# Patient Record
Sex: Female | Born: 1950 | Race: White | Hispanic: No | Marital: Single | Smoking: Former smoker
Health system: Southern US, Community
[De-identification: ages and names within clinical notes are randomized; demographics above are authoritative.]

## PROBLEM LIST (undated history)

## (undated) DIAGNOSIS — I509 Heart failure, unspecified: Secondary | ICD-10-CM

## (undated) DIAGNOSIS — U071 COVID-19: Secondary | ICD-10-CM

## (undated) DIAGNOSIS — I2510 Atherosclerotic heart disease of native coronary artery without angina pectoris: Secondary | ICD-10-CM

## (undated) DIAGNOSIS — E119 Type 2 diabetes mellitus without complications: Secondary | ICD-10-CM

## (undated) DIAGNOSIS — E785 Hyperlipidemia, unspecified: Secondary | ICD-10-CM

## (undated) DIAGNOSIS — E079 Disorder of thyroid, unspecified: Secondary | ICD-10-CM

## (undated) DIAGNOSIS — I214 Non-ST elevation (NSTEMI) myocardial infarction: Secondary | ICD-10-CM

## (undated) HISTORY — DX: Hyperlipidemia, unspecified: E78.5

## (undated) HISTORY — DX: Heart failure, unspecified: I50.9

## (undated) HISTORY — DX: Type 2 diabetes mellitus without complications: E11.9

## (undated) HISTORY — PX: CARDIAC CATHETERIZATION: SHX172

## (undated) HISTORY — DX: Non-ST elevation (NSTEMI) myocardial infarction: I21.4

## (undated) HISTORY — DX: COVID-19: U07.1

## (undated) HISTORY — DX: Atherosclerotic heart disease of native coronary artery without angina pectoris: I25.10

## (undated) HISTORY — DX: Disorder of thyroid, unspecified: E07.9

## (undated) HISTORY — PX: KNEE SURGERY: SHX244

---

## 2003-12-19 ENCOUNTER — Ambulatory Visit: Payer: Self-pay | Admitting: Family Medicine

## 2004-02-03 ENCOUNTER — Ambulatory Visit: Payer: Self-pay | Admitting: Family Medicine

## 2004-03-05 ENCOUNTER — Ambulatory Visit: Payer: Self-pay | Admitting: Family Medicine

## 2004-04-07 ENCOUNTER — Ambulatory Visit: Payer: Self-pay | Admitting: Family Medicine

## 2004-05-05 ENCOUNTER — Ambulatory Visit: Payer: Self-pay | Admitting: Family Medicine

## 2004-06-03 ENCOUNTER — Ambulatory Visit: Payer: Self-pay | Admitting: Family Medicine

## 2004-09-08 ENCOUNTER — Ambulatory Visit: Payer: Self-pay | Admitting: Family Medicine

## 2005-01-14 ENCOUNTER — Ambulatory Visit: Payer: Self-pay | Admitting: Family Medicine

## 2005-02-19 ENCOUNTER — Ambulatory Visit: Payer: Self-pay | Admitting: Family Medicine

## 2005-03-04 ENCOUNTER — Encounter: Admission: RE | Admit: 2005-03-04 | Discharge: 2005-03-04 | Payer: Self-pay | Admitting: Family Medicine

## 2005-03-19 ENCOUNTER — Encounter: Admission: RE | Admit: 2005-03-19 | Discharge: 2005-03-19 | Payer: Self-pay | Admitting: Family Medicine

## 2015-01-23 DIAGNOSIS — E039 Hypothyroidism, unspecified: Secondary | ICD-10-CM | POA: Diagnosis not present

## 2015-01-23 DIAGNOSIS — E784 Other hyperlipidemia: Secondary | ICD-10-CM | POA: Diagnosis not present

## 2015-01-23 DIAGNOSIS — E119 Type 2 diabetes mellitus without complications: Secondary | ICD-10-CM | POA: Diagnosis not present

## 2015-01-27 DIAGNOSIS — M2550 Pain in unspecified joint: Secondary | ICD-10-CM | POA: Diagnosis not present

## 2015-01-27 DIAGNOSIS — E784 Other hyperlipidemia: Secondary | ICD-10-CM | POA: Diagnosis not present

## 2015-01-27 DIAGNOSIS — R32 Unspecified urinary incontinence: Secondary | ICD-10-CM | POA: Diagnosis not present

## 2015-01-27 DIAGNOSIS — E119 Type 2 diabetes mellitus without complications: Secondary | ICD-10-CM | POA: Diagnosis not present

## 2015-01-27 DIAGNOSIS — I10 Essential (primary) hypertension: Secondary | ICD-10-CM | POA: Diagnosis not present

## 2015-01-27 DIAGNOSIS — E039 Hypothyroidism, unspecified: Secondary | ICD-10-CM | POA: Diagnosis not present

## 2015-01-27 DIAGNOSIS — F329 Major depressive disorder, single episode, unspecified: Secondary | ICD-10-CM | POA: Diagnosis not present

## 2015-07-01 DIAGNOSIS — E7800 Pure hypercholesterolemia, unspecified: Secondary | ICD-10-CM | POA: Diagnosis not present

## 2015-07-01 DIAGNOSIS — R7301 Impaired fasting glucose: Secondary | ICD-10-CM | POA: Diagnosis not present

## 2015-07-14 DIAGNOSIS — E039 Hypothyroidism, unspecified: Secondary | ICD-10-CM | POA: Diagnosis not present

## 2015-07-14 DIAGNOSIS — E119 Type 2 diabetes mellitus without complications: Secondary | ICD-10-CM | POA: Insufficient documentation

## 2015-07-14 DIAGNOSIS — F339 Major depressive disorder, recurrent, unspecified: Secondary | ICD-10-CM | POA: Diagnosis not present

## 2015-07-14 DIAGNOSIS — E784 Other hyperlipidemia: Secondary | ICD-10-CM | POA: Diagnosis not present

## 2015-07-14 DIAGNOSIS — G4733 Obstructive sleep apnea (adult) (pediatric): Secondary | ICD-10-CM | POA: Diagnosis not present

## 2015-07-14 DIAGNOSIS — R32 Unspecified urinary incontinence: Secondary | ICD-10-CM | POA: Diagnosis not present

## 2015-07-14 DIAGNOSIS — Z0000 Encounter for general adult medical examination without abnormal findings: Secondary | ICD-10-CM | POA: Diagnosis not present

## 2015-07-14 DIAGNOSIS — E1169 Type 2 diabetes mellitus with other specified complication: Secondary | ICD-10-CM | POA: Insufficient documentation

## 2015-07-14 DIAGNOSIS — M65342 Trigger finger, left ring finger: Secondary | ICD-10-CM | POA: Diagnosis not present

## 2015-07-14 DIAGNOSIS — E7849 Other hyperlipidemia: Secondary | ICD-10-CM | POA: Insufficient documentation

## 2015-07-14 DIAGNOSIS — I10 Essential (primary) hypertension: Secondary | ICD-10-CM | POA: Insufficient documentation

## 2015-07-14 DIAGNOSIS — Z1231 Encounter for screening mammogram for malignant neoplasm of breast: Secondary | ICD-10-CM | POA: Diagnosis not present

## 2015-07-14 DIAGNOSIS — I152 Hypertension secondary to endocrine disorders: Secondary | ICD-10-CM | POA: Insufficient documentation

## 2015-07-14 DIAGNOSIS — M2550 Pain in unspecified joint: Secondary | ICD-10-CM | POA: Diagnosis not present

## 2015-07-14 DIAGNOSIS — E1159 Type 2 diabetes mellitus with other circulatory complications: Secondary | ICD-10-CM | POA: Insufficient documentation

## 2015-07-14 DIAGNOSIS — Z794 Long term (current) use of insulin: Secondary | ICD-10-CM | POA: Diagnosis not present

## 2015-07-28 DIAGNOSIS — R399 Unspecified symptoms and signs involving the genitourinary system: Secondary | ICD-10-CM | POA: Diagnosis not present

## 2015-07-28 DIAGNOSIS — Z01419 Encounter for gynecological examination (general) (routine) without abnormal findings: Secondary | ICD-10-CM | POA: Diagnosis not present

## 2015-07-31 DIAGNOSIS — Z1231 Encounter for screening mammogram for malignant neoplasm of breast: Secondary | ICD-10-CM | POA: Diagnosis not present

## 2015-09-24 DIAGNOSIS — M65342 Trigger finger, left ring finger: Secondary | ICD-10-CM | POA: Diagnosis not present

## 2015-09-24 DIAGNOSIS — G8929 Other chronic pain: Secondary | ICD-10-CM | POA: Diagnosis not present

## 2015-09-24 DIAGNOSIS — M720 Palmar fascial fibromatosis [Dupuytren]: Secondary | ICD-10-CM | POA: Diagnosis not present

## 2015-09-24 DIAGNOSIS — M25562 Pain in left knee: Secondary | ICD-10-CM | POA: Diagnosis not present

## 2015-09-24 DIAGNOSIS — M25561 Pain in right knee: Secondary | ICD-10-CM | POA: Diagnosis not present

## 2015-10-22 DIAGNOSIS — M79642 Pain in left hand: Secondary | ICD-10-CM | POA: Diagnosis not present

## 2015-11-24 DIAGNOSIS — M2550 Pain in unspecified joint: Secondary | ICD-10-CM | POA: Diagnosis not present

## 2015-11-24 DIAGNOSIS — R8290 Unspecified abnormal findings in urine: Secondary | ICD-10-CM | POA: Diagnosis not present

## 2015-11-24 DIAGNOSIS — Z23 Encounter for immunization: Secondary | ICD-10-CM | POA: Diagnosis not present

## 2015-11-24 DIAGNOSIS — E119 Type 2 diabetes mellitus without complications: Secondary | ICD-10-CM | POA: Diagnosis not present

## 2015-11-24 DIAGNOSIS — Z794 Long term (current) use of insulin: Secondary | ICD-10-CM | POA: Diagnosis not present

## 2015-11-24 DIAGNOSIS — I10 Essential (primary) hypertension: Secondary | ICD-10-CM | POA: Diagnosis not present

## 2015-11-24 DIAGNOSIS — M25562 Pain in left knee: Secondary | ICD-10-CM | POA: Diagnosis not present

## 2015-11-24 DIAGNOSIS — F339 Major depressive disorder, recurrent, unspecified: Secondary | ICD-10-CM | POA: Diagnosis not present

## 2015-11-24 DIAGNOSIS — M25561 Pain in right knee: Secondary | ICD-10-CM | POA: Diagnosis not present

## 2015-11-24 DIAGNOSIS — E784 Other hyperlipidemia: Secondary | ICD-10-CM | POA: Diagnosis not present

## 2015-11-24 DIAGNOSIS — H8142 Vertigo of central origin, left ear: Secondary | ICD-10-CM | POA: Diagnosis not present

## 2015-11-24 DIAGNOSIS — E1165 Type 2 diabetes mellitus with hyperglycemia: Secondary | ICD-10-CM | POA: Diagnosis not present

## 2015-11-24 DIAGNOSIS — G8929 Other chronic pain: Secondary | ICD-10-CM | POA: Diagnosis not present

## 2015-12-03 DIAGNOSIS — I10 Essential (primary) hypertension: Secondary | ICD-10-CM | POA: Diagnosis not present

## 2015-12-03 DIAGNOSIS — E039 Hypothyroidism, unspecified: Secondary | ICD-10-CM | POA: Diagnosis not present

## 2015-12-03 DIAGNOSIS — Z794 Long term (current) use of insulin: Secondary | ICD-10-CM | POA: Diagnosis not present

## 2015-12-03 DIAGNOSIS — M2550 Pain in unspecified joint: Secondary | ICD-10-CM | POA: Diagnosis not present

## 2015-12-03 DIAGNOSIS — E119 Type 2 diabetes mellitus without complications: Secondary | ICD-10-CM | POA: Diagnosis not present

## 2015-12-03 DIAGNOSIS — E784 Other hyperlipidemia: Secondary | ICD-10-CM | POA: Diagnosis not present

## 2016-04-01 DIAGNOSIS — E784 Other hyperlipidemia: Secondary | ICD-10-CM | POA: Diagnosis not present

## 2016-04-01 DIAGNOSIS — I10 Essential (primary) hypertension: Secondary | ICD-10-CM | POA: Diagnosis not present

## 2016-04-01 DIAGNOSIS — Z794 Long term (current) use of insulin: Secondary | ICD-10-CM | POA: Diagnosis not present

## 2016-04-01 DIAGNOSIS — E039 Hypothyroidism, unspecified: Secondary | ICD-10-CM | POA: Diagnosis not present

## 2016-04-01 DIAGNOSIS — E119 Type 2 diabetes mellitus without complications: Secondary | ICD-10-CM | POA: Diagnosis not present

## 2016-04-05 DIAGNOSIS — G8929 Other chronic pain: Secondary | ICD-10-CM | POA: Diagnosis not present

## 2016-04-05 DIAGNOSIS — E119 Type 2 diabetes mellitus without complications: Secondary | ICD-10-CM | POA: Diagnosis not present

## 2016-04-05 DIAGNOSIS — E039 Hypothyroidism, unspecified: Secondary | ICD-10-CM | POA: Diagnosis not present

## 2016-04-05 DIAGNOSIS — E784 Other hyperlipidemia: Secondary | ICD-10-CM | POA: Diagnosis not present

## 2016-04-05 DIAGNOSIS — F339 Major depressive disorder, recurrent, unspecified: Secondary | ICD-10-CM | POA: Diagnosis not present

## 2016-04-05 DIAGNOSIS — M25562 Pain in left knee: Secondary | ICD-10-CM | POA: Diagnosis not present

## 2016-04-05 DIAGNOSIS — R319 Hematuria, unspecified: Secondary | ICD-10-CM | POA: Diagnosis not present

## 2016-04-05 DIAGNOSIS — M2550 Pain in unspecified joint: Secondary | ICD-10-CM | POA: Diagnosis not present

## 2016-04-05 DIAGNOSIS — M25561 Pain in right knee: Secondary | ICD-10-CM | POA: Diagnosis not present

## 2016-04-05 DIAGNOSIS — R8299 Other abnormal findings in urine: Secondary | ICD-10-CM | POA: Diagnosis not present

## 2016-04-05 DIAGNOSIS — I10 Essential (primary) hypertension: Secondary | ICD-10-CM | POA: Diagnosis not present

## 2016-04-05 DIAGNOSIS — Z794 Long term (current) use of insulin: Secondary | ICD-10-CM | POA: Diagnosis not present

## 2016-07-27 DIAGNOSIS — G4733 Obstructive sleep apnea (adult) (pediatric): Secondary | ICD-10-CM | POA: Diagnosis not present

## 2016-07-27 DIAGNOSIS — E784 Other hyperlipidemia: Secondary | ICD-10-CM | POA: Diagnosis not present

## 2016-07-27 DIAGNOSIS — I10 Essential (primary) hypertension: Secondary | ICD-10-CM | POA: Diagnosis not present

## 2016-07-27 DIAGNOSIS — Z23 Encounter for immunization: Secondary | ICD-10-CM | POA: Diagnosis not present

## 2016-07-27 DIAGNOSIS — E119 Type 2 diabetes mellitus without complications: Secondary | ICD-10-CM | POA: Diagnosis not present

## 2016-07-27 DIAGNOSIS — E039 Hypothyroidism, unspecified: Secondary | ICD-10-CM | POA: Diagnosis not present

## 2016-07-27 DIAGNOSIS — Z0000 Encounter for general adult medical examination without abnormal findings: Secondary | ICD-10-CM | POA: Diagnosis not present

## 2017-01-14 DIAGNOSIS — F339 Major depressive disorder, recurrent, unspecified: Secondary | ICD-10-CM | POA: Diagnosis not present

## 2017-01-14 DIAGNOSIS — E119 Type 2 diabetes mellitus without complications: Secondary | ICD-10-CM | POA: Diagnosis not present

## 2017-01-14 DIAGNOSIS — E039 Hypothyroidism, unspecified: Secondary | ICD-10-CM | POA: Diagnosis not present

## 2017-01-14 DIAGNOSIS — I10 Essential (primary) hypertension: Secondary | ICD-10-CM | POA: Diagnosis not present

## 2017-01-14 DIAGNOSIS — Z794 Long term (current) use of insulin: Secondary | ICD-10-CM | POA: Diagnosis not present

## 2017-01-14 DIAGNOSIS — E7849 Other hyperlipidemia: Secondary | ICD-10-CM | POA: Diagnosis not present

## 2017-04-14 DIAGNOSIS — E7849 Other hyperlipidemia: Secondary | ICD-10-CM | POA: Diagnosis not present

## 2017-04-14 DIAGNOSIS — I10 Essential (primary) hypertension: Secondary | ICD-10-CM | POA: Diagnosis not present

## 2017-04-14 DIAGNOSIS — E119 Type 2 diabetes mellitus without complications: Secondary | ICD-10-CM | POA: Diagnosis not present

## 2017-05-04 DIAGNOSIS — E039 Hypothyroidism, unspecified: Secondary | ICD-10-CM | POA: Diagnosis not present

## 2017-05-04 DIAGNOSIS — M720 Palmar fascial fibromatosis [Dupuytren]: Secondary | ICD-10-CM | POA: Diagnosis not present

## 2017-05-04 DIAGNOSIS — E119 Type 2 diabetes mellitus without complications: Secondary | ICD-10-CM | POA: Diagnosis not present

## 2017-05-04 DIAGNOSIS — F339 Major depressive disorder, recurrent, unspecified: Secondary | ICD-10-CM | POA: Diagnosis not present

## 2017-05-18 DIAGNOSIS — Z794 Long term (current) use of insulin: Secondary | ICD-10-CM | POA: Insufficient documentation

## 2017-05-18 DIAGNOSIS — E119 Type 2 diabetes mellitus without complications: Secondary | ICD-10-CM | POA: Insufficient documentation

## 2017-05-19 DIAGNOSIS — L820 Inflamed seborrheic keratosis: Secondary | ICD-10-CM | POA: Diagnosis not present

## 2017-05-19 DIAGNOSIS — L209 Atopic dermatitis, unspecified: Secondary | ICD-10-CM | POA: Diagnosis not present

## 2017-05-24 DIAGNOSIS — Z794 Long term (current) use of insulin: Secondary | ICD-10-CM | POA: Diagnosis not present

## 2017-05-24 DIAGNOSIS — I872 Venous insufficiency (chronic) (peripheral): Secondary | ICD-10-CM | POA: Diagnosis not present

## 2017-05-24 DIAGNOSIS — E119 Type 2 diabetes mellitus without complications: Secondary | ICD-10-CM | POA: Diagnosis not present

## 2017-05-25 DIAGNOSIS — Z1231 Encounter for screening mammogram for malignant neoplasm of breast: Secondary | ICD-10-CM | POA: Diagnosis not present

## 2017-06-30 DIAGNOSIS — E119 Type 2 diabetes mellitus without complications: Secondary | ICD-10-CM | POA: Diagnosis not present

## 2017-06-30 DIAGNOSIS — Z794 Long term (current) use of insulin: Secondary | ICD-10-CM | POA: Diagnosis not present

## 2017-06-30 DIAGNOSIS — I872 Venous insufficiency (chronic) (peripheral): Secondary | ICD-10-CM | POA: Diagnosis not present

## 2017-10-24 DIAGNOSIS — I10 Essential (primary) hypertension: Secondary | ICD-10-CM | POA: Diagnosis not present

## 2017-10-24 DIAGNOSIS — G4733 Obstructive sleep apnea (adult) (pediatric): Secondary | ICD-10-CM | POA: Diagnosis not present

## 2017-10-24 DIAGNOSIS — Z794 Long term (current) use of insulin: Secondary | ICD-10-CM | POA: Diagnosis not present

## 2017-10-24 DIAGNOSIS — R8290 Unspecified abnormal findings in urine: Secondary | ICD-10-CM | POA: Diagnosis not present

## 2017-10-24 DIAGNOSIS — E119 Type 2 diabetes mellitus without complications: Secondary | ICD-10-CM | POA: Diagnosis not present

## 2017-10-24 DIAGNOSIS — E039 Hypothyroidism, unspecified: Secondary | ICD-10-CM | POA: Diagnosis not present

## 2017-10-24 DIAGNOSIS — I872 Venous insufficiency (chronic) (peripheral): Secondary | ICD-10-CM | POA: Diagnosis not present

## 2017-10-24 DIAGNOSIS — M79642 Pain in left hand: Secondary | ICD-10-CM | POA: Diagnosis not present

## 2017-10-24 DIAGNOSIS — Z23 Encounter for immunization: Secondary | ICD-10-CM | POA: Diagnosis not present

## 2017-10-24 DIAGNOSIS — F339 Major depressive disorder, recurrent, unspecified: Secondary | ICD-10-CM | POA: Diagnosis not present

## 2017-10-24 DIAGNOSIS — M720 Palmar fascial fibromatosis [Dupuytren]: Secondary | ICD-10-CM | POA: Diagnosis not present

## 2017-10-25 DIAGNOSIS — R8290 Unspecified abnormal findings in urine: Secondary | ICD-10-CM | POA: Diagnosis not present

## 2018-06-08 DIAGNOSIS — Z794 Long term (current) use of insulin: Secondary | ICD-10-CM | POA: Diagnosis not present

## 2018-06-08 DIAGNOSIS — R5383 Other fatigue: Secondary | ICD-10-CM | POA: Diagnosis not present

## 2018-06-08 DIAGNOSIS — E119 Type 2 diabetes mellitus without complications: Secondary | ICD-10-CM | POA: Diagnosis not present

## 2018-06-08 DIAGNOSIS — E039 Hypothyroidism, unspecified: Secondary | ICD-10-CM | POA: Diagnosis not present

## 2018-06-08 DIAGNOSIS — F339 Major depressive disorder, recurrent, unspecified: Secondary | ICD-10-CM | POA: Diagnosis not present

## 2018-06-08 DIAGNOSIS — Z6841 Body Mass Index (BMI) 40.0 and over, adult: Secondary | ICD-10-CM | POA: Diagnosis not present

## 2018-06-08 DIAGNOSIS — E7849 Other hyperlipidemia: Secondary | ICD-10-CM | POA: Diagnosis not present

## 2018-06-08 DIAGNOSIS — Z0000 Encounter for general adult medical examination without abnormal findings: Secondary | ICD-10-CM | POA: Diagnosis not present

## 2018-06-08 DIAGNOSIS — I10 Essential (primary) hypertension: Secondary | ICD-10-CM | POA: Diagnosis not present

## 2018-06-22 DIAGNOSIS — I10 Essential (primary) hypertension: Secondary | ICD-10-CM | POA: Diagnosis not present

## 2018-07-10 ENCOUNTER — Other Ambulatory Visit: Payer: Self-pay | Admitting: Pharmacist

## 2018-07-10 NOTE — Patient Outreach (Signed)
  [facility]) Care Management  [place]   07/10/2018  Mallory Butler 02/10/1950 [id number]  Reason for referral: Medication Assistance  Referral source: HTA Referral medication(s): Novolog Mix 70/30 Current insurance:HTA  HPI:  Type 2 Diabetes, hyperlipidemia, hypertension, hypothyroidism, OSA    Objective: No Known Allergies  Medications Reviewed Today    Reviewed by Elayne Guerin, [facility] (Pharmacist) on 07/10/18 at 1431  Med List Status: <None>  Medication Order Taking? Sig Documenting Provider Last Dose Status Informant  aspirin EC 81 MG tablet [id number] Yes Take 1 tablet by mouth daily. [provider] Taking Active   bisoprolol (ZEBETA) 5 MG tablet [id number] Yes Take 5 mg by mouth daily. [provider] Taking Active   fenofibrate 160 MG tablet [id number] Yes Take 160 mg by mouth daily. [provider] Taking Active   glipiZIDE (GLUCOTROL) 10 MG tablet [id number] Yes TAKE 1 TABLET (10 MG TOTAL) BY MOUTH 2 TIMES DAILY BEFORE MEALS. [provider] Taking Active   glucose blood (ONETOUCH ULTRA) test strip [id number] Yes USE TO TEST 3 TIMES DAILY [provider] Taking Active   insulin aspart protamine - aspart (NOVOLOG MIX 70/30 FLEXPEN) (70-30) 100 UNIT/ML FlexPen [id number] Yes INJECT 35 UNITS INTO SKIN TWICE A DAY [provider] Taking Active   levothyroxine (SYNTHROID) 88 MCG tablet [id number] Yes TAKE 1 TABLET BY MOUTH EVERY DAY [provider] Taking Active   lisinopril-hydrochlorothiazide (ZESTORETIC) 20-25 MG tablet [id number] Yes TAKE 1 TABLET BY MOUTH EVERY DAY [provider] Taking Active   metFORMIN (GLUCOPHAGE) 1000 MG tablet [id number] Yes Take 1 tablet by mouth 2 (two) times a day. [provider] Taking Active   oxybutynin (DITROPAN) 5 MG tablet [id number] Yes TAKE 1 TABLET BY MOUTH TWICE A DAY FOR BLADDER [provider] Taking Active   venlafaxine XR (EFFEXOR-XR) 150  MG 24 hr capsule [id number] Yes TAKE 1 CAPSULE BY MOUTH EVERY DAY [provider] Taking Active           Assessment:  Drugs sorted by system:  Neurologic/Psychologic: Venlafaxine XR  Cardiovascular: Aspirin, Bisoprolol, Fenofibrate, Lisinopril/HCTZ,    Endocrine: Glipizide, Novolog Mix 70/30, Levothyroxine, Metformin  Genitourinary: Oxybutynin  Medication Review Findings:  . HgA1c-9.3% 06/08/2018 (not at goal) . May consider adding GLP-1  Medication Assistance Findings:  Medication assistance needs identified: Novolog Mix 70/30  Patient is over income for LIS She is already in the coverage gap   According to HTA: OOP is $530.93 TDS is $4,852.12  She may qualify to receive Novolog 70/30 from [place] Patient Assistance Program  Additional medication assistance options reviewed with patient as warranted:  Coupon and Tree surgeon lists-Novo Cares Immediate Coupon  Called [facility] and they were able to bill the patient through the Novo Immediate Coupon--patient was able to receive 2 boxes of Novolog 70/30 at no cost.  Plan: I will route patient assistance letter to [place] technician who will coordinate patient assistance program application process for medications listed above.  [facility] pharmacy technician will assist with obtaining all required documents from both patient and provider(s) and submit application(s) once completed.    Elayne Guerin, PharmD, [place] Clinical Pharmacist [phone]

## 2018-07-11 ENCOUNTER — Other Ambulatory Visit: Payer: Self-pay | Admitting: Pharmacy Technician

## 2018-07-11 NOTE — Patient Outreach (Signed)
[place] [facility]) Care Management  07/11/2018  Mallory Butler [birth year] [id number]                           Medication Assistance Referral  Referral From: [place]  Medication/Company: Radiographer, therapeutic / [employer] Patient application portion:  Education officer, museum portion: Faxed  to Garnetta Buddy, NP    Follow up:  Will follow up with patient in 5-10 business days to confirm application(s) have been received.  Jhamal Plucinski P. Darrik Richman, [place] Management [phone]

## 2018-07-21 ENCOUNTER — Other Ambulatory Visit: Payer: Self-pay | Admitting: Pharmacy Technician

## 2018-07-21 NOTE — Patient Outreach (Signed)
[place] [facility]) Care Management  07/21/2018  Mallory Butler 08/06/1950 [id number]    Unsuccessful outreach call placed to patient in regards to [employer] application for [employer] 70/30 Flexpen.  Unfortunately patient did not answer the phone and after approximately 10-15 rings the phone beeped and then disconnected the call.  Was calling patient to inquire if she had received the PAP application.  Will followup with 2nd outreach call in 3-5 business days.  Graceyn Fodor P. Mirabel Ahlgren, [place] Management [phone]

## 2018-07-27 ENCOUNTER — Other Ambulatory Visit: Payer: Self-pay | Admitting: Pharmacy Technician

## 2018-07-27 ENCOUNTER — Other Ambulatory Visit: Payer: Self-pay | Admitting: Pharmacist

## 2018-07-27 NOTE — Patient Outreach (Signed)
[place] [facility]) Care Management  07/27/2018  THETA LEAF July 19, 1950 [id number]   Reviewed patient's chart to see who was the prescriber of her insulin after receiving a message from [place] that Dr. Hassell Done' office faxed [place] back saying the patient had been put out of the practice.  According to [employer] data, Novolog 70/30 FlexPen was written by  Laren Boom, PA-C.    Provider Nodal's office was called. The receptionist stated the patient was an active patient at their practice.   Spoke with [employer] and she will fax the applications to the office of Reinold Nodal. (Which appears to be the same practice as Garnetta Buddy, MD).  Plan: Follow up at original call back.  Elayne Guerin, PharmD, [place] Clinical Pharmacist [phone]

## 2018-07-27 NOTE — Patient Outreach (Signed)
[place] [facility]) Care Management  07/27/2018  Mallory Butler [birth year] [id number]    Second unsuccessful outreach attempt made to patient in regards to [employer] application for [employer] 70/30 [employer].  Unfortunately patient did not answer the phone, HIPAA compliant voicemail left.  Will followup with 3rd and final outreach attempt in 3-7 business days if call is not returned.  Marissia Blackham P. Cora Brierley, [place] Management [phone]

## 2018-08-02 ENCOUNTER — Other Ambulatory Visit: Payer: Self-pay | Admitting: Pharmacy Technician

## 2018-08-02 NOTE — Patient Outreach (Signed)
[name]) Care Management  08/02/2018  Mallory Butler January 16, 1950 [id number]   3rd unsuccessful outreach attempt made to patient in regards to [employer] application for [employer] 70/30 Flexpen.  Unfortunately patient did not answer the phone, attempted to leave HIPAA compliant voicemail but the line disconnected before I could finish the message. Waited a few minutes and called back but the phone continuously rang for 15-20 rings and then disconnected the call.  Will route note to [place] that patient assistance case is being closed as after 3 attempts unable to engage patient. Will remove myself from care team.  [name]. [name], [name] Management [phone]

## 2018-08-04 ENCOUNTER — Other Ambulatory Visit: Payer: Self-pay

## 2018-08-21 ENCOUNTER — Ambulatory Visit: Payer: Self-pay | Admitting: Pharmacist

## 2018-08-21 ENCOUNTER — Other Ambulatory Visit: Payer: Self-pay | Admitting: Pharmacist

## 2018-08-21 NOTE — Patient Outreach (Signed)
[place] [facility]) Care Management  08/21/2018  Mallory Butler Nov 07, 1950 [id number]  Patient was called to follow up on medication assistance. Unfortunately, she did not answer her phone. HIPAA compliant message could not be left on her voicemail as her phone rang >20 times without an answer or voicemail then disconnected the call.  Sharee Pimple [place] has also been trying to call the patient unsuccessfully.  Plan: Send unsuccessful outreach letter. Await a call back from the patient. Call patient back in 10-14 business days. Close case if I do not hear back from the patient.   Elayne Guerin, PharmD, [place] Clinical Pharmacist [phone]

## 2018-09-21 ENCOUNTER — Ambulatory Visit: Payer: Self-pay | Admitting: Pharmacist

## 2018-09-21 ENCOUNTER — Other Ambulatory Visit: Payer: Self-pay | Admitting: Pharmacy Technician

## 2018-09-21 ENCOUNTER — Other Ambulatory Visit: Payer: Self-pay | Admitting: Pharmacist

## 2018-09-21 NOTE — Patient Outreach (Signed)
[place] [facility]) Care Management  09/21/2018  Mallory Butler Nov 12, 1950 [id number]                                       Medication Assistance Referral  Referral From: [place]  Medication/Company: Novolog Mix 70/30 / Novo Nordisk Patient application portion:  Education officer, museum portion: Faxed  to Dr. Greig Right Provider address/fax verified via: Call to office and office website     Follow up:  Will follow up with patient in 5-10 business days to confirm application(s) have been received.  Seabron Iannello P. Ynez Eugenio, [place] Management [phone]

## 2018-09-21 NOTE — Patient Outreach (Addendum)
[place] [facility]) Care Management  [place]   09/21/2018  IMO CUMBIE 02-22-1950 [id number]  Reason for referral: medication assistance  Referral source: previously referred Referral medication(s): Novolog 70/30 Current insurance: HTA  HPI:  Patient is a 68 year old female with multiple medical conditions including but not limited to:  Type 2 diabetes, hyperlipidemia, hypertension, OSA, and hypothyroidism.  [facility] has been attempting to help the patient with the cost of Novolog for quite some time. Patient has been sent a [employer] application in the mail but we have not been able to contact her to confirm receipt of the application .    Patient answered today. HIPAA identifiers were obtained.  Patient said she had not received the forms we sent her and requested that we sent her another set.  Objective: No Known Allergies  Medications Reviewed Today    Reviewed by Elayne Guerin, [facility] (Pharmacist) on 09/21/18 at 1321  Med List Status: <None>  Medication Order Taking? Sig Documenting Provider Last Dose Status Informant  aspirin EC 81 MG tablet [id number] Yes Take 1 tablet by mouth daily. [provider] Taking Active   bisoprolol (ZEBETA) 5 MG tablet [id number] Yes Take 5 mg by mouth daily. [provider] Taking Active   fenofibrate 160 MG tablet [id number] Yes Take 160 mg by mouth daily. [provider] Taking Active   glipiZIDE (GLUCOTROL) 10 MG tablet [id number] Yes TAKE 1 TABLET (10 MG TOTAL) BY MOUTH 2 TIMES DAILY BEFORE MEALS. [provider] Taking Active   glucose blood (ONETOUCH ULTRA) test strip [id number] Yes USE TO TEST 3 TIMES DAILY [provider] Taking Active   insulin aspart protamine - aspart (NOVOLOG MIX 70/30 FLEXPEN) (70-30) 100 UNIT/ML FlexPen [id number] Yes INJECT 35 UNITS INTO SKIN TWICE A DAY [provider] Taking Active   levothyroxine (SYNTHROID) 88 MCG tablet [id number] Yes TAKE 1 TABLET BY MOUTH  EVERY DAY [provider] Taking Active   lisinopril-hydrochlorothiazide (ZESTORETIC) 20-25 MG tablet [id number] Yes TAKE 1 TABLET BY MOUTH EVERY DAY [provider] Taking Active   metFORMIN (GLUCOPHAGE) 1000 MG tablet [id number] Yes Take 1 tablet by mouth 2 (two) times a day. [provider] Taking Active   oxybutynin (DITROPAN) 5 MG tablet [id number] Yes TAKE 1 TABLET BY MOUTH TWICE A DAY FOR BLADDER [provider] Taking Active   simvastatin (ZOCOR) 80 MG tablet [id number] Yes Take 1 tablet by mouth daily. [provider] Taking Active   venlafaxine XR (EFFEXOR-XR) 150 MG 24 hr capsule [id number] Yes TAKE 1 CAPSULE BY MOUTH EVERY DAY [provider] Taking Active           Assessment:  Drugs sorted by system:  Neurologic/Psychologic: Venlafaxine XR  Cardiovascular: Aspirin, Bisoprolol, Fenofibrate, Lisinopril-HCTZ,   Endocrine: Glipizide, Novolog, Metformin, Levothyroxine  Genitourinary: Oxybutynin   Medication Review Findings:  . HgA1c 9.3%-on Glipizide, Metformin and Novolog 70/30 (If we are able to get the patient set up through patient assistance programming Basal/Bolus therapy or the addition of a GLP-1 may increase her glucose control). o Will also refer patient to [place] Management. - Patient reported JUST restarting Simvastatin 80 mg 1 tablet daily o LDL-107 TG 238   . 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  [facility] will assist with obtaining all required documents from both  patient and provider(s) and submit application(s) once completed.     Send [employer] Novo Cares Immediate Coupon with application for a free box of Novolog.  Refer to [facility].  Follow up with patient on application, blood sugars and the referral for disease state management in 2-3 weeks.  Route note to Dr. Alinda DeemPamela Penner (patient's new PCP)  Beecher McardleKatina J. Nikeria Kalman, PharmD, [facility] Clinical Pharmacist [phone]

## 2018-09-27 ENCOUNTER — Other Ambulatory Visit: Payer: Self-pay | Admitting: *Deleted

## 2018-09-27 NOTE — Patient Outreach (Signed)
[place] [facility]) Care Management  09/27/2018  [name] 02-18-1950 [id number]   Outreach Attempt:  Received referral for Disease Management.  Patient is active with [employer] insurance and should be followed by [facility] or [place].    Plan:  Will no open case to Disease Management at this time.  [place] [phone] [email or web address]

## 2018-10-02 ENCOUNTER — Other Ambulatory Visit: Payer: Self-pay | Admitting: Pharmacy Technician

## 2018-10-02 NOTE — Patient Outreach (Signed)
[place] [facility]) Care Management  10/02/2018  Mallory Butler Jun 19, 1950 [id number]  Unsuccessful outgoing call placed to patient in regards to [employer] application for Novolog Mix 70/30.  Unfortunately patient did not answer the phone, no HIPAA compliant message could be left because the phone rang 10-15 times then beeped, then call was disconnected.  Was calling patient to inquire if she had received the 2nd application that was mailed to her.  Will followup with patient in 5-7 business days.  [name] P. Hezakiah Champeau, [place] Management [phone]

## 2018-10-04 DIAGNOSIS — E7800 Pure hypercholesterolemia, unspecified: Secondary | ICD-10-CM | POA: Diagnosis not present

## 2018-10-04 DIAGNOSIS — F329 Major depressive disorder, single episode, unspecified: Secondary | ICD-10-CM | POA: Diagnosis not present

## 2018-10-04 DIAGNOSIS — E038 Other specified hypothyroidism: Secondary | ICD-10-CM | POA: Diagnosis not present

## 2018-10-04 DIAGNOSIS — E1169 Type 2 diabetes mellitus with other specified complication: Secondary | ICD-10-CM | POA: Diagnosis not present

## 2018-10-04 DIAGNOSIS — I10 Essential (primary) hypertension: Secondary | ICD-10-CM | POA: Diagnosis not present

## 2018-10-11 ENCOUNTER — Other Ambulatory Visit: Payer: Self-pay | Admitting: Pharmacy Technician

## 2018-10-11 NOTE — Patient Outreach (Addendum)
[place] [facility]) Care Management  10/11/2018  Mallory Butler March 16, 1950 [id number]   Unsuccessful outreach call #2 placed to patient in regards to [employer] application for Novolog Mi 70/30 flex pen.  Unfortunately patient did not answer the phone. Tried calling patient twice and the phone just rings and then clicks and then hangs up on you.  Was calling patient to inquire if she has received the 2nd mailing of the application.  Will attempt 3rd outreach attempt in 5-7 business days.  Marcelina Mclaurin P. Tajanae Guilbault, [place] Management [phone]

## 2018-10-17 ENCOUNTER — Ambulatory Visit: Payer: Self-pay | Admitting: Pharmacist

## 2018-10-17 ENCOUNTER — Other Ambulatory Visit: Payer: Self-pay | Admitting: Pharmacist

## 2018-10-17 NOTE — Patient Outreach (Addendum)
[place] [facility]) Care Management  10/17/2018  Mallory Butler 1950-06-25 [id number]   Patient was called to follow up on medication assistance. Unfortunately, she did not answer the phone. HIPPA compliant message was left on her voicemail.    Patient has been called multiple times by [facility] and has not returned the forms that were sent to her.  Plan: Send patient an unsuccessful outreach letter.  Call patient back in 2-3 weeks. Patient is active with [facility] and is being followed by them for disease state management.  Elayne Guerin, PharmD, [place] Clinical Pharmacist [phone]

## 2018-10-18 ENCOUNTER — Other Ambulatory Visit: Payer: Self-pay | Admitting: Pharmacy Technician

## 2018-10-18 NOTE — Patient Outreach (Signed)
[place] [facility]) Care Management  10/18/2018  Mallory Butler [birth year] [id number]   3rd unsuccessful outreach call placed to patient in regards to [employer] application for [place].  Unfortunately patient did not answer the phone, HIPAA compliant voicemail left.  Was calling patient to inquire if she received the 2nd set of applications that were mailed to her on 09/21/2018.  Will route note to [place] that after 3 attempts unable to reach patient and will close patient assistance case. Will remove myself from care team.  [name]. Daejah Klebba, [place] Management [phone]

## 2018-11-02 DIAGNOSIS — E113219 Type 2 diabetes mellitus with mild nonproliferative diabetic retinopathy with macular edema, unspecified eye: Secondary | ICD-10-CM | POA: Diagnosis not present

## 2018-11-02 DIAGNOSIS — E1169 Type 2 diabetes mellitus with other specified complication: Secondary | ICD-10-CM | POA: Diagnosis not present

## 2018-11-02 DIAGNOSIS — N390 Urinary tract infection, site not specified: Secondary | ICD-10-CM | POA: Diagnosis not present

## 2018-11-03 DIAGNOSIS — R350 Frequency of micturition: Secondary | ICD-10-CM | POA: Diagnosis not present

## 2018-11-09 ENCOUNTER — Other Ambulatory Visit: Payer: Self-pay | Admitting: Pharmacist

## 2018-11-09 ENCOUNTER — Ambulatory Visit: Payer: Self-pay | Admitting: Pharmacist

## 2018-11-09 NOTE — Patient Outreach (Signed)
[facility]) Care Management  11/09/2018  JAYMI TINNER 1950/07/29 [id number]   Patient was called regarding medication assistance. Unfortunately, she did not answer her phone. The phone rang more than 20 times without an answer so a HIPAA compliant message could not be left.  Patient has been called multiple times unsuccessfully by Susy Frizzle and myself. Unsuccessful outreach letter was sent to patient on 10/17/2018.   Patient has traditional HTA and is being case managed by [facility].  Plan: Close patient's case.   Elayne Guerin, PharmD, [place] Clinical Pharmacist [phone]

## 2018-11-16 NOTE — Patient Outreach (Signed)
Received a referral from Dr. Greig Right at [facility]. Patient has HTA insurance, This referral has been transferred to HTA CM through email address [email or web address] per workflow.

## 2018-12-07 DIAGNOSIS — Z6841 Body Mass Index (BMI) 40.0 and over, adult: Secondary | ICD-10-CM | POA: Diagnosis not present

## 2018-12-07 DIAGNOSIS — E1169 Type 2 diabetes mellitus with other specified complication: Secondary | ICD-10-CM | POA: Diagnosis not present

## 2018-12-07 DIAGNOSIS — Z23 Encounter for immunization: Secondary | ICD-10-CM | POA: Diagnosis not present

## 2019-03-22 DIAGNOSIS — E1169 Type 2 diabetes mellitus with other specified complication: Secondary | ICD-10-CM | POA: Diagnosis not present

## 2019-03-22 DIAGNOSIS — E7800 Pure hypercholesterolemia, unspecified: Secondary | ICD-10-CM | POA: Diagnosis not present

## 2019-03-22 DIAGNOSIS — E113299 Type 2 diabetes mellitus with mild nonproliferative diabetic retinopathy without macular edema, unspecified eye: Secondary | ICD-10-CM | POA: Diagnosis not present

## 2019-03-22 DIAGNOSIS — F330 Major depressive disorder, recurrent, mild: Secondary | ICD-10-CM | POA: Diagnosis not present

## 2019-03-22 DIAGNOSIS — I10 Essential (primary) hypertension: Secondary | ICD-10-CM | POA: Diagnosis not present

## 2019-03-22 DIAGNOSIS — M720 Palmar fascial fibromatosis [Dupuytren]: Secondary | ICD-10-CM | POA: Diagnosis not present

## 2019-07-02 DIAGNOSIS — E1169 Type 2 diabetes mellitus with other specified complication: Secondary | ICD-10-CM | POA: Diagnosis not present

## 2019-07-02 DIAGNOSIS — E7800 Pure hypercholesterolemia, unspecified: Secondary | ICD-10-CM | POA: Diagnosis not present

## 2019-07-02 DIAGNOSIS — Z6841 Body Mass Index (BMI) 40.0 and over, adult: Secondary | ICD-10-CM | POA: Diagnosis not present

## 2019-07-02 DIAGNOSIS — E113299 Type 2 diabetes mellitus with mild nonproliferative diabetic retinopathy without macular edema, unspecified eye: Secondary | ICD-10-CM | POA: Diagnosis not present

## 2019-07-02 DIAGNOSIS — E038 Other specified hypothyroidism: Secondary | ICD-10-CM | POA: Diagnosis not present

## 2019-07-02 DIAGNOSIS — I10 Essential (primary) hypertension: Secondary | ICD-10-CM | POA: Diagnosis not present

## 2019-09-03 DIAGNOSIS — E1169 Type 2 diabetes mellitus with other specified complication: Secondary | ICD-10-CM | POA: Diagnosis not present

## 2019-09-03 DIAGNOSIS — F5101 Primary insomnia: Secondary | ICD-10-CM | POA: Diagnosis not present

## 2019-10-08 DIAGNOSIS — D519 Vitamin B12 deficiency anemia, unspecified: Secondary | ICD-10-CM | POA: Diagnosis not present

## 2019-10-08 DIAGNOSIS — Z6841 Body Mass Index (BMI) 40.0 and over, adult: Secondary | ICD-10-CM | POA: Diagnosis not present

## 2019-10-08 DIAGNOSIS — I10 Essential (primary) hypertension: Secondary | ICD-10-CM | POA: Diagnosis not present

## 2019-10-08 DIAGNOSIS — E113299 Type 2 diabetes mellitus with mild nonproliferative diabetic retinopathy without macular edema, unspecified eye: Secondary | ICD-10-CM | POA: Diagnosis not present

## 2019-10-08 DIAGNOSIS — E1169 Type 2 diabetes mellitus with other specified complication: Secondary | ICD-10-CM | POA: Diagnosis not present

## 2019-10-08 DIAGNOSIS — R5381 Other malaise: Secondary | ICD-10-CM | POA: Diagnosis not present

## 2019-10-08 DIAGNOSIS — E7800 Pure hypercholesterolemia, unspecified: Secondary | ICD-10-CM | POA: Diagnosis not present

## 2019-10-16 DIAGNOSIS — Z90710 Acquired absence of both cervix and uterus: Secondary | ICD-10-CM | POA: Diagnosis not present

## 2019-10-16 DIAGNOSIS — Z6841 Body Mass Index (BMI) 40.0 and over, adult: Secondary | ICD-10-CM | POA: Diagnosis not present

## 2019-10-16 DIAGNOSIS — M8589 Other specified disorders of bone density and structure, multiple sites: Secondary | ICD-10-CM | POA: Diagnosis not present

## 2019-10-16 DIAGNOSIS — Z0000 Encounter for general adult medical examination without abnormal findings: Secondary | ICD-10-CM | POA: Diagnosis not present

## 2019-10-16 DIAGNOSIS — N3000 Acute cystitis without hematuria: Secondary | ICD-10-CM | POA: Diagnosis not present

## 2019-10-16 DIAGNOSIS — Z23 Encounter for immunization: Secondary | ICD-10-CM | POA: Diagnosis not present

## 2019-10-16 DIAGNOSIS — E113211 Type 2 diabetes mellitus with mild nonproliferative diabetic retinopathy with macular edema, right eye: Secondary | ICD-10-CM | POA: Diagnosis not present

## 2020-01-16 DIAGNOSIS — E038 Other specified hypothyroidism: Secondary | ICD-10-CM | POA: Diagnosis not present

## 2020-01-16 DIAGNOSIS — E7800 Pure hypercholesterolemia, unspecified: Secondary | ICD-10-CM | POA: Diagnosis not present

## 2020-01-16 DIAGNOSIS — N951 Menopausal and female climacteric states: Secondary | ICD-10-CM | POA: Diagnosis not present

## 2020-01-16 DIAGNOSIS — N39498 Other specified urinary incontinence: Secondary | ICD-10-CM | POA: Diagnosis not present

## 2020-01-16 DIAGNOSIS — Z1231 Encounter for screening mammogram for malignant neoplasm of breast: Secondary | ICD-10-CM | POA: Diagnosis not present

## 2020-01-16 DIAGNOSIS — E1169 Type 2 diabetes mellitus with other specified complication: Secondary | ICD-10-CM | POA: Diagnosis not present

## 2020-01-16 DIAGNOSIS — E113219 Type 2 diabetes mellitus with mild nonproliferative diabetic retinopathy with macular edema, unspecified eye: Secondary | ICD-10-CM | POA: Diagnosis not present

## 2020-01-16 DIAGNOSIS — Z6841 Body Mass Index (BMI) 40.0 and over, adult: Secondary | ICD-10-CM | POA: Diagnosis not present

## 2020-03-11 DIAGNOSIS — I10 Essential (primary) hypertension: Secondary | ICD-10-CM | POA: Diagnosis not present

## 2020-03-11 DIAGNOSIS — E7800 Pure hypercholesterolemia, unspecified: Secondary | ICD-10-CM | POA: Diagnosis not present

## 2020-03-11 DIAGNOSIS — Z6841 Body Mass Index (BMI) 40.0 and over, adult: Secondary | ICD-10-CM | POA: Diagnosis not present

## 2020-03-11 DIAGNOSIS — E038 Other specified hypothyroidism: Secondary | ICD-10-CM | POA: Diagnosis not present

## 2020-03-11 DIAGNOSIS — F329 Major depressive disorder, single episode, unspecified: Secondary | ICD-10-CM | POA: Diagnosis not present

## 2020-03-11 DIAGNOSIS — N3000 Acute cystitis without hematuria: Secondary | ICD-10-CM | POA: Diagnosis not present

## 2020-03-11 DIAGNOSIS — E1169 Type 2 diabetes mellitus with other specified complication: Secondary | ICD-10-CM | POA: Diagnosis not present

## 2020-03-20 DIAGNOSIS — E113313 Type 2 diabetes mellitus with moderate nonproliferative diabetic retinopathy with macular edema, bilateral: Secondary | ICD-10-CM | POA: Diagnosis not present

## 2020-03-20 DIAGNOSIS — H353132 Nonexudative age-related macular degeneration, bilateral, intermediate dry stage: Secondary | ICD-10-CM | POA: Diagnosis not present

## 2020-03-21 DIAGNOSIS — Z1231 Encounter for screening mammogram for malignant neoplasm of breast: Secondary | ICD-10-CM | POA: Diagnosis not present

## 2020-03-21 DIAGNOSIS — M8589 Other specified disorders of bone density and structure, multiple sites: Secondary | ICD-10-CM | POA: Diagnosis not present

## 2020-03-21 DIAGNOSIS — Z13820 Encounter for screening for osteoporosis: Secondary | ICD-10-CM | POA: Diagnosis not present

## 2020-06-11 DIAGNOSIS — Z6841 Body Mass Index (BMI) 40.0 and over, adult: Secondary | ICD-10-CM | POA: Diagnosis not present

## 2020-06-11 DIAGNOSIS — E1169 Type 2 diabetes mellitus with other specified complication: Secondary | ICD-10-CM | POA: Diagnosis not present

## 2020-06-11 DIAGNOSIS — I10 Essential (primary) hypertension: Secondary | ICD-10-CM | POA: Diagnosis not present

## 2020-06-11 DIAGNOSIS — E1121 Type 2 diabetes mellitus with diabetic nephropathy: Secondary | ICD-10-CM | POA: Diagnosis not present

## 2020-06-11 DIAGNOSIS — R32 Unspecified urinary incontinence: Secondary | ICD-10-CM | POA: Diagnosis not present

## 2020-06-11 DIAGNOSIS — E038 Other specified hypothyroidism: Secondary | ICD-10-CM | POA: Diagnosis not present

## 2020-06-11 DIAGNOSIS — E113299 Type 2 diabetes mellitus with mild nonproliferative diabetic retinopathy without macular edema, unspecified eye: Secondary | ICD-10-CM | POA: Diagnosis not present

## 2020-06-11 DIAGNOSIS — E7800 Pure hypercholesterolemia, unspecified: Secondary | ICD-10-CM | POA: Diagnosis not present

## 2020-06-11 DIAGNOSIS — E1140 Type 2 diabetes mellitus with diabetic neuropathy, unspecified: Secondary | ICD-10-CM | POA: Diagnosis not present

## 2020-06-11 DIAGNOSIS — R300 Dysuria: Secondary | ICD-10-CM | POA: Diagnosis not present

## 2020-08-06 DIAGNOSIS — N3281 Overactive bladder: Secondary | ICD-10-CM | POA: Diagnosis not present

## 2020-08-06 DIAGNOSIS — Z79899 Other long term (current) drug therapy: Secondary | ICD-10-CM | POA: Diagnosis not present

## 2020-08-06 DIAGNOSIS — N952 Postmenopausal atrophic vaginitis: Secondary | ICD-10-CM | POA: Diagnosis not present

## 2020-08-11 DIAGNOSIS — Z6841 Body Mass Index (BMI) 40.0 and over, adult: Secondary | ICD-10-CM | POA: Diagnosis not present

## 2020-08-11 DIAGNOSIS — E113219 Type 2 diabetes mellitus with mild nonproliferative diabetic retinopathy with macular edema, unspecified eye: Secondary | ICD-10-CM | POA: Diagnosis not present

## 2020-08-11 DIAGNOSIS — I10 Essential (primary) hypertension: Secondary | ICD-10-CM | POA: Diagnosis not present

## 2020-11-12 DIAGNOSIS — E038 Other specified hypothyroidism: Secondary | ICD-10-CM | POA: Diagnosis not present

## 2020-11-12 DIAGNOSIS — I10 Essential (primary) hypertension: Secondary | ICD-10-CM | POA: Diagnosis not present

## 2020-11-12 DIAGNOSIS — Z23 Encounter for immunization: Secondary | ICD-10-CM | POA: Diagnosis not present

## 2020-11-12 DIAGNOSIS — Z6841 Body Mass Index (BMI) 40.0 and over, adult: Secondary | ICD-10-CM | POA: Diagnosis not present

## 2020-11-12 DIAGNOSIS — E1169 Type 2 diabetes mellitus with other specified complication: Secondary | ICD-10-CM | POA: Diagnosis not present

## 2020-11-12 DIAGNOSIS — Z0000 Encounter for general adult medical examination without abnormal findings: Secondary | ICD-10-CM | POA: Diagnosis not present

## 2020-11-12 DIAGNOSIS — E7800 Pure hypercholesterolemia, unspecified: Secondary | ICD-10-CM | POA: Diagnosis not present

## 2020-11-12 DIAGNOSIS — E113219 Type 2 diabetes mellitus with mild nonproliferative diabetic retinopathy with macular edema, unspecified eye: Secondary | ICD-10-CM | POA: Diagnosis not present

## 2020-12-02 DIAGNOSIS — N3281 Overactive bladder: Secondary | ICD-10-CM | POA: Diagnosis not present

## 2020-12-02 DIAGNOSIS — R81 Glycosuria: Secondary | ICD-10-CM | POA: Diagnosis not present

## 2020-12-02 DIAGNOSIS — N952 Postmenopausal atrophic vaginitis: Secondary | ICD-10-CM | POA: Diagnosis not present

## 2020-12-02 DIAGNOSIS — N390 Urinary tract infection, site not specified: Secondary | ICD-10-CM | POA: Diagnosis not present

## 2020-12-05 DIAGNOSIS — E113219 Type 2 diabetes mellitus with mild nonproliferative diabetic retinopathy with macular edema, unspecified eye: Secondary | ICD-10-CM | POA: Diagnosis not present

## 2020-12-05 DIAGNOSIS — Z6841 Body Mass Index (BMI) 40.0 and over, adult: Secondary | ICD-10-CM | POA: Diagnosis not present

## 2020-12-05 DIAGNOSIS — E1169 Type 2 diabetes mellitus with other specified complication: Secondary | ICD-10-CM | POA: Diagnosis not present

## 2020-12-05 DIAGNOSIS — I10 Essential (primary) hypertension: Secondary | ICD-10-CM | POA: Diagnosis not present

## 2020-12-05 DIAGNOSIS — N39498 Other specified urinary incontinence: Secondary | ICD-10-CM | POA: Diagnosis not present

## 2020-12-05 DIAGNOSIS — N1831 Chronic kidney disease, stage 3a: Secondary | ICD-10-CM | POA: Diagnosis not present

## 2020-12-05 DIAGNOSIS — F330 Major depressive disorder, recurrent, mild: Secondary | ICD-10-CM | POA: Diagnosis not present

## 2021-02-01 ENCOUNTER — Emergency Department (HOSPITAL_COMMUNITY): Payer: Medicare Other

## 2021-02-01 ENCOUNTER — Other Ambulatory Visit: Payer: Self-pay

## 2021-02-01 ENCOUNTER — Inpatient Hospital Stay (HOSPITAL_COMMUNITY)
Admission: EM | Admit: 2021-02-01 | Discharge: 2021-02-06 | DRG: 246 | Disposition: A | Discharge status: Home Health | Payer: Medicare Other | Attending: Internal Medicine | Admitting: Internal Medicine

## 2021-02-01 DIAGNOSIS — Z6841 Body Mass Index (BMI) 40.0 and over, adult: Secondary | ICD-10-CM

## 2021-02-01 DIAGNOSIS — I700 Atherosclerosis of aorta: Secondary | ICD-10-CM | POA: Diagnosis present

## 2021-02-01 DIAGNOSIS — I5041 Acute combined systolic (congestive) and diastolic (congestive) heart failure: Secondary | ICD-10-CM

## 2021-02-01 DIAGNOSIS — I25110 Atherosclerotic heart disease of native coronary artery with unstable angina pectoris: Secondary | ICD-10-CM | POA: Diagnosis not present

## 2021-02-01 DIAGNOSIS — T502X5A Adverse effect of carbonic-anhydrase inhibitors, benzothiadiazides and other diuretics, initial encounter: Secondary | ICD-10-CM | POA: Diagnosis present

## 2021-02-01 DIAGNOSIS — I10 Essential (primary) hypertension: Secondary | ICD-10-CM | POA: Diagnosis present

## 2021-02-01 DIAGNOSIS — J90 Pleural effusion, not elsewhere classified: Secondary | ICD-10-CM

## 2021-02-01 DIAGNOSIS — E1165 Type 2 diabetes mellitus with hyperglycemia: Secondary | ICD-10-CM | POA: Diagnosis not present

## 2021-02-01 DIAGNOSIS — Z79899 Other long term (current) drug therapy: Secondary | ICD-10-CM | POA: Diagnosis not present

## 2021-02-01 DIAGNOSIS — E7800 Pure hypercholesterolemia, unspecified: Secondary | ICD-10-CM | POA: Diagnosis present

## 2021-02-01 DIAGNOSIS — E119 Type 2 diabetes mellitus without complications: Secondary | ICD-10-CM | POA: Diagnosis present

## 2021-02-01 DIAGNOSIS — Z7982 Long term (current) use of aspirin: Secondary | ICD-10-CM

## 2021-02-01 DIAGNOSIS — E039 Hypothyroidism, unspecified: Secondary | ICD-10-CM | POA: Diagnosis not present

## 2021-02-01 DIAGNOSIS — N179 Acute kidney failure, unspecified: Secondary | ICD-10-CM | POA: Diagnosis present

## 2021-02-01 DIAGNOSIS — I129 Hypertensive chronic kidney disease with stage 1 through stage 4 chronic kidney disease, or unspecified chronic kidney disease: Secondary | ICD-10-CM | POA: Diagnosis not present

## 2021-02-01 DIAGNOSIS — E1169 Type 2 diabetes mellitus with other specified complication: Secondary | ICD-10-CM | POA: Diagnosis present

## 2021-02-01 DIAGNOSIS — U071 COVID-19: Secondary | ICD-10-CM | POA: Diagnosis not present

## 2021-02-01 DIAGNOSIS — E785 Hyperlipidemia, unspecified: Secondary | ICD-10-CM | POA: Diagnosis not present

## 2021-02-01 DIAGNOSIS — R778 Other specified abnormalities of plasma proteins: Secondary | ICD-10-CM

## 2021-02-01 DIAGNOSIS — I2584 Coronary atherosclerosis due to calcified coronary lesion: Secondary | ICD-10-CM | POA: Diagnosis not present

## 2021-02-01 DIAGNOSIS — R059 Cough, unspecified: Secondary | ICD-10-CM | POA: Diagnosis not present

## 2021-02-01 DIAGNOSIS — I5043 Acute on chronic combined systolic (congestive) and diastolic (congestive) heart failure: Secondary | ICD-10-CM | POA: Diagnosis present

## 2021-02-01 DIAGNOSIS — Z79890 Hormone replacement therapy (postmenopausal): Secondary | ICD-10-CM | POA: Diagnosis not present

## 2021-02-01 DIAGNOSIS — N1831 Chronic kidney disease, stage 3a: Secondary | ICD-10-CM | POA: Diagnosis present

## 2021-02-01 DIAGNOSIS — I214 Non-ST elevation (NSTEMI) myocardial infarction: Secondary | ICD-10-CM | POA: Diagnosis not present

## 2021-02-01 DIAGNOSIS — R739 Hyperglycemia, unspecified: Secondary | ICD-10-CM | POA: Diagnosis not present

## 2021-02-01 DIAGNOSIS — I130 Hypertensive heart and chronic kidney disease with heart failure and stage 1 through stage 4 chronic kidney disease, or unspecified chronic kidney disease: Secondary | ICD-10-CM | POA: Diagnosis not present

## 2021-02-01 DIAGNOSIS — I959 Hypotension, unspecified: Secondary | ICD-10-CM | POA: Diagnosis not present

## 2021-02-01 DIAGNOSIS — Z7984 Long term (current) use of oral hypoglycemic drugs: Secondary | ICD-10-CM | POA: Diagnosis not present

## 2021-02-01 DIAGNOSIS — E1122 Type 2 diabetes mellitus with diabetic chronic kidney disease: Secondary | ICD-10-CM | POA: Diagnosis present

## 2021-02-01 DIAGNOSIS — R9431 Abnormal electrocardiogram [ECG] [EKG]: Secondary | ICD-10-CM

## 2021-02-01 DIAGNOSIS — I2510 Atherosclerotic heart disease of native coronary artery without angina pectoris: Secondary | ICD-10-CM

## 2021-02-01 DIAGNOSIS — I255 Ischemic cardiomyopathy: Secondary | ICD-10-CM | POA: Diagnosis not present

## 2021-02-01 DIAGNOSIS — I5021 Acute systolic (congestive) heart failure: Secondary | ICD-10-CM | POA: Diagnosis not present

## 2021-02-01 DIAGNOSIS — R0602 Shortness of breath: Secondary | ICD-10-CM

## 2021-02-01 DIAGNOSIS — Z955 Presence of coronary angioplasty implant and graft: Secondary | ICD-10-CM

## 2021-02-01 DIAGNOSIS — J811 Chronic pulmonary edema: Secondary | ICD-10-CM | POA: Diagnosis not present

## 2021-02-01 DIAGNOSIS — R7989 Other specified abnormal findings of blood chemistry: Secondary | ICD-10-CM | POA: Diagnosis not present

## 2021-02-01 DIAGNOSIS — Z794 Long term (current) use of insulin: Secondary | ICD-10-CM

## 2021-02-01 DIAGNOSIS — I509 Heart failure, unspecified: Secondary | ICD-10-CM

## 2021-02-01 LAB — HEMOGLOBIN A1C
Hgb A1c MFr Bld: 12 % — ABNORMAL HIGH (ref 4.8–5.6)
Mean Plasma Glucose: 297.7 mg/dL

## 2021-02-01 LAB — CBC
HCT: 34.9 % — ABNORMAL LOW (ref 36.0–46.0)
Hemoglobin: 11 g/dL — ABNORMAL LOW (ref 12.0–15.0)
MCH: 31.4 pg (ref 26.0–34.0)
MCHC: 31.5 g/dL (ref 30.0–36.0)
MCV: 99.7 fL (ref 80.0–100.0)
Platelets: 269 10*3/uL (ref 150–400)
RBC: 3.5 MIL/uL — ABNORMAL LOW (ref 3.87–5.11)
RDW: 13.7 % (ref 11.5–15.5)
WBC: 8.1 10*3/uL (ref 4.0–10.5)
nRBC: 0 % (ref 0.0–0.2)

## 2021-02-01 LAB — CBG MONITORING, ED
Glucose-Capillary: 502 mg/dL (ref 70–99)
Glucose-Capillary: 260 mg/dL — ABNORMAL HIGH (ref 70–99)
Glucose-Capillary: 393 mg/dL — ABNORMAL HIGH (ref 70–99)

## 2021-02-01 LAB — GLUCOSE, CAPILLARY
Glucose-Capillary: 245 mg/dL — ABNORMAL HIGH (ref 70–99)
Glucose-Capillary: 296 mg/dL — ABNORMAL HIGH (ref 70–99)
Glucose-Capillary: 313 mg/dL — ABNORMAL HIGH (ref 70–99)

## 2021-02-01 LAB — D-DIMER, QUANTITATIVE: D-Dimer, Quant: 0.82 ug/mL-FEU — ABNORMAL HIGH (ref 0.00–0.50)

## 2021-02-01 LAB — HIV ANTIBODY (ROUTINE TESTING W REFLEX): HIV Screen 4th Generation wRfx: NONREACTIVE

## 2021-02-01 LAB — ECHOCARDIOGRAM LIMITED
Area-P 1/2: 3.85 cm2
Calc EF: 22.1 %
Height: 63 in
S' Lateral: 4 cm
Single Plane A2C EF: 21.3 %
Single Plane A4C EF: 25 %
Weight: 3680 oz

## 2021-02-01 LAB — COMPREHENSIVE METABOLIC PANEL
ALT: 20 U/L (ref 0–44)
AST: 24 U/L (ref 15–41)
Albumin: 2.8 g/dL — ABNORMAL LOW (ref 3.5–5.0)
Alkaline Phosphatase: 91 U/L (ref 38–126)
Anion gap: 13 (ref 5–15)
BUN: 19 mg/dL (ref 8–23)
CO2: 19 mmol/L — ABNORMAL LOW (ref 22–32)
Calcium: 8.4 mg/dL — ABNORMAL LOW (ref 8.9–10.3)
Chloride: 102 mmol/L (ref 98–111)
Creatinine, Ser: 1.12 mg/dL — ABNORMAL HIGH (ref 0.44–1.00)
GFR, Estimated: 53 mL/min — ABNORMAL LOW (ref >60)
Glucose, Bld: 554 mg/dL (ref 70–99)
Potassium: 4.1 mmol/L (ref 3.5–5.1)
Sodium: 134 mmol/L — ABNORMAL LOW (ref 135–145)
Total Bilirubin: 0.4 mg/dL (ref 0.3–1.2)
Total Protein: 6.7 g/dL (ref 6.5–8.1)

## 2021-02-01 LAB — TROPONIN I (HIGH SENSITIVITY)
Troponin I (High Sensitivity): 973 ng/L (ref <18)
Troponin I (High Sensitivity): 1058 ng/L (ref <18)

## 2021-02-01 LAB — BASIC METABOLIC PANEL
Anion gap: 14 (ref 5–15)
BUN: 21 mg/dL (ref 8–23)
CO2: 18 mmol/L — ABNORMAL LOW (ref 22–32)
Calcium: 8.7 mg/dL — ABNORMAL LOW (ref 8.9–10.3)
Chloride: 102 mmol/L (ref 98–111)
Creatinine, Ser: 1.22 mg/dL — ABNORMAL HIGH (ref 0.44–1.00)
GFR, Estimated: 48 mL/min — ABNORMAL LOW (ref >60)
Glucose, Bld: 309 mg/dL — ABNORMAL HIGH (ref 70–99)
Potassium: 4.3 mmol/L (ref 3.5–5.1)
Sodium: 134 mmol/L — ABNORMAL LOW (ref 135–145)

## 2021-02-01 LAB — PROTIME-INR
INR: 1 (ref 0.8–1.2)
Prothrombin Time: 13.1 seconds (ref 11.4–15.2)

## 2021-02-01 LAB — LACTIC ACID, PLASMA: Lactic Acid, Venous: 2.3 mmol/L (ref 0.5–1.9)

## 2021-02-01 LAB — RESP PANEL BY RT-PCR (FLU A&B, COVID) ARPGX2
Influenza A by PCR: NEGATIVE
Influenza B by PCR: NEGATIVE
SARS Coronavirus 2 by RT PCR: POSITIVE — AB

## 2021-02-01 LAB — BRAIN NATRIURETIC PEPTIDE: B Natriuretic Peptide: 647.1 pg/mL — ABNORMAL HIGH (ref 0.0–100.0)

## 2021-02-01 LAB — TSH: TSH: 3.953 u[IU]/mL (ref 0.350–4.500)

## 2021-02-01 LAB — CORTISOL: Cortisol, Plasma: 24 ug/dL

## 2021-02-01 MED ORDER — HYDROMORPHONE HCL 1 MG/ML IJ SOLN
0.2500 mg | Freq: Once | INTRAMUSCULAR | Status: AC
  Administered 2021-02-01: 0.25 mg via INTRAVENOUS
  Filled 2021-02-01: qty 1

## 2021-02-01 MED ORDER — SODIUM CHLORIDE 0.9% FLUSH
3.0000 mL | Freq: Two times a day (BID) | INTRAVENOUS | Status: DC
  Administered 2021-02-01 – 2021-02-05 (×5): 3 mL via INTRAVENOUS

## 2021-02-01 MED ORDER — ALBUMIN HUMAN 25 % IV SOLN
25.0000 g | Freq: Four times a day (QID) | INTRAVENOUS | Status: AC
  Administered 2021-02-01 – 2021-02-02 (×4): 25 g via INTRAVENOUS
  Filled 2021-02-01 (×5): qty 100

## 2021-02-01 MED ORDER — VENLAFAXINE HCL ER 150 MG PO CP24
150.0000 mg | ORAL_CAPSULE | Freq: Every day | ORAL | Status: DC
  Administered 2021-02-02 – 2021-02-06 (×5): 150 mg via ORAL
  Filled 2021-02-01: qty 2
  Filled 2021-02-01 (×6): qty 1

## 2021-02-01 MED ORDER — FENOFIBRATE 160 MG PO TABS
160.0000 mg | ORAL_TABLET | Freq: Every day | ORAL | Status: DC
Start: 2021-02-01 — End: 2021-02-01

## 2021-02-01 MED ORDER — ASPIRIN EC 81 MG PO TBEC
81.0000 mg | DELAYED_RELEASE_TABLET | Freq: Every day | ORAL | Status: DC
  Administered 2021-02-01 – 2021-02-02 (×2): 81 mg via ORAL
  Filled 2021-02-01 (×2): qty 1

## 2021-02-01 MED ORDER — HEPARIN (PORCINE) 25000 UT/250ML-% IV SOLN: 1300.0000 [IU]/h | INTRAVENOUS | Status: DC

## 2021-02-01 MED ORDER — SODIUM CHLORIDE 0.9% FLUSH: 3.0000 mL | INTRAVENOUS | Status: DC | PRN

## 2021-02-01 MED ORDER — DEXAMETHASONE 6 MG PO TABS
6.0000 mg | ORAL_TABLET | ORAL | Status: DC
  Administered 2021-02-02: 6 mg via ORAL
  Filled 2021-02-01: qty 1

## 2021-02-01 MED ORDER — SODIUM CHLORIDE 0.9 % IV SOLN
200.0000 mg | Freq: Once | INTRAVENOUS | Status: AC
  Administered 2021-02-01: 200 mg via INTRAVENOUS
  Filled 2021-02-01: qty 40

## 2021-02-01 MED ORDER — BISOPROLOL FUMARATE 5 MG PO TABS
5.0000 mg | ORAL_TABLET | Freq: Every day | ORAL | Status: DC
Start: 2021-02-02 — End: 2021-02-06
  Administered 2021-02-02 – 2021-02-06 (×5): 5 mg via ORAL
  Filled 2021-02-01 (×5): qty 1

## 2021-02-01 MED ORDER — SODIUM CHLORIDE 0.9 % IV SOLN
100.0000 mg | Freq: Every day | INTRAVENOUS | Status: AC
  Administered 2021-02-02 – 2021-02-05 (×4): 100 mg via INTRAVENOUS
  Filled 2021-02-01 (×4): qty 20

## 2021-02-01 MED ORDER — SODIUM CHLORIDE 0.9% FLUSH
3.0000 mL | Freq: Two times a day (BID) | INTRAVENOUS | Status: DC
  Administered 2021-02-01 – 2021-02-02 (×2): 3 mL via INTRAVENOUS

## 2021-02-01 MED ORDER — IOHEXOL 350 MG/ML SOLN
75.0000 mL | Freq: Once | INTRAVENOUS | Status: AC | PRN
Start: 2021-02-01 — End: 2021-02-01
  Administered 2021-02-01: 75 mL via INTRAVENOUS

## 2021-02-01 MED ORDER — DEXTROSE IN LACTATED RINGERS 5 % IV SOLN: INTRAVENOUS | Status: DC

## 2021-02-01 MED ORDER — DEXTROSE 50 % IV SOLN: 0.0000 mL | INTRAVENOUS | Status: DC | PRN

## 2021-02-01 MED ORDER — INSULIN ASPART 100 UNIT/ML IJ SOLN
0.0000 [IU] | Freq: Three times a day (TID) | INTRAMUSCULAR | Status: DC
  Administered 2021-02-02: 8 [IU] via SUBCUTANEOUS
  Administered 2021-02-02: 11 [IU] via SUBCUTANEOUS
  Administered 2021-02-02: 15 [IU] via SUBCUTANEOUS

## 2021-02-01 MED ORDER — SODIUM CHLORIDE 0.9 % IV SOLN: 250.0000 mL | INTRAVENOUS | Status: DC | PRN

## 2021-02-01 MED ORDER — HEPARIN (PORCINE) 25000 UT/250ML-% IV SOLN
1200.0000 [IU]/h | INTRAVENOUS | Status: DC
  Administered 2021-02-01: 17:00:00 1000 [IU]/h via INTRAVENOUS
  Filled 2021-02-01: qty 250

## 2021-02-01 MED ORDER — GUAIFENESIN ER 600 MG PO TB12
1200.0000 mg | ORAL_TABLET | Freq: Two times a day (BID) | ORAL | Status: DC
  Administered 2021-02-01 – 2021-02-06 (×10): 1200 mg via ORAL
  Filled 2021-02-01 (×10): qty 2

## 2021-02-01 MED ORDER — LEVOTHYROXINE SODIUM 88 MCG PO TABS
88.0000 ug | ORAL_TABLET | Freq: Every day | ORAL | Status: DC
  Administered 2021-02-02 – 2021-02-06 (×5): 88 ug via ORAL
  Filled 2021-02-01 (×6): qty 1

## 2021-02-01 MED ORDER — HEPARIN BOLUS VIA INFUSION
4000.0000 [IU] | Freq: Once | INTRAVENOUS | Status: AC
  Administered 2021-02-01: 4000 [IU] via INTRAVENOUS
  Filled 2021-02-01: qty 4000

## 2021-02-01 MED ORDER — OXYBUTYNIN CHLORIDE 5 MG PO TABS: 2.5000 mg | ORAL_TABLET | Freq: Three times a day (TID) | ORAL | Status: DC | PRN

## 2021-02-01 MED ORDER — ONDANSETRON HCL 4 MG/2ML IJ SOLN
4.0000 mg | Freq: Four times a day (QID) | INTRAMUSCULAR | Status: DC | PRN
  Administered 2021-02-02: 4 mg via INTRAVENOUS
  Filled 2021-02-01: qty 2

## 2021-02-01 MED ORDER — LACTATED RINGERS IV SOLN: INTRAVENOUS | Status: DC

## 2021-02-01 MED ORDER — INSULIN REGULAR(HUMAN) IN NACL 100-0.9 UT/100ML-% IV SOLN
INTRAVENOUS | Status: DC
  Administered 2021-02-01: 15 [IU]/h via INTRAVENOUS
  Filled 2021-02-01: qty 100

## 2021-02-01 MED ORDER — ATORVASTATIN CALCIUM 40 MG PO TABS
40.0000 mg | ORAL_TABLET | Freq: Every day | ORAL | Status: DC
  Administered 2021-02-02 – 2021-02-06 (×5): 40 mg via ORAL
  Filled 2021-02-01 (×5): qty 1

## 2021-02-01 MED ORDER — PERFLUTREN LIPID MICROSPHERE
1.0000 mL | INTRAVENOUS | Status: AC | PRN
  Administered 2021-02-01: 3 mL via INTRAVENOUS
  Filled 2021-02-01: qty 10

## 2021-02-01 MED ORDER — ACETAMINOPHEN 325 MG PO TABS
650.0000 mg | ORAL_TABLET | ORAL | Status: DC | PRN
  Administered 2021-02-02: 650 mg via ORAL
  Filled 2021-02-01: qty 2

## 2021-02-01 NOTE — ED Notes (Signed)
Notified Dr. Chipper Herb and Dr. Odis Hollingshead of BP 88/59 and trending. Dr. Chipper Herb stating to recheck in 10 minutes. No orders at this time.

## 2021-02-01 NOTE — Progress Notes (Signed)
°  Echocardiogram 2D Echocardiogram has been performed.  Delcie Roch 02/01/2021, 5:03 PM

## 2021-02-01 NOTE — H&P (Signed)
History and Physical    ODA PLACKE BPZ:[id number] DOB: Jan 29, 1950 DOA: 02/01/2021  PCP: Janie Morning, DO (Confirm with patient/family/NH records and if not entered, this has to be entered at [facility] point of entry) Patient coming from: Home  I have personally briefly reviewed patient's old medical records in [place]  Chief Complaint: Cough, SOB  HPI: Mallory Butler is a 71 y.o. female with medical history significant of IDDM, HTN, HLD, came with worsening of cough and shortness of breath.  Symptoms started about 3 weeks ago, initially with dry cough and productive with whitish foamy sputum production, and increasing shortness of breath exertional, worsened with walking or lying down.  Denies any chest pain.  She also had episode of subjective fever and night sweats, and she used self COVID kit last week at home to test which turned negative.  Symptoms was worse this morning and then came to the ED.  She stopped taking her insulin last week for " feeling so sick and has to take the cough medications/OTC meds"  ED Course: Blood pressure on the low side, no tachycardia.  Glucose 500, troponin 978>1000, Glucose>500. EKG showed ST changes in multiple leads.  Echo showed decreased LVEF.  Cardiology reviewed the case and started patient on heparin drip.  COVID positive  Review of Systems: As per HPI otherwise 14 point review of systems negative.    No past medical history on file.     has no history on file for tobacco use, alcohol use, and drug use.  No Known Allergies  No family history on file.    Prior to Admission medications   Medication Sig Start Date End Date Taking? Authorizing Provider  aspirin EC 81 MG tablet Take 1 tablet by mouth daily. 07/14/15   [provider]  bisoprolol (ZEBETA) 5 MG tablet Take 5 mg by mouth daily. 03/19/18   [provider]  fenofibrate 160 MG tablet Take 160 mg by mouth daily. 05/12/18   [provider]  glipiZIDE  (GLUCOTROL) 10 MG tablet TAKE 1 TABLET (10 MG TOTAL) BY MOUTH 2 TIMES DAILY BEFORE MEALS. 05/30/18   [provider]  glucose blood (ONETOUCH ULTRA) test strip USE TO TEST 3 TIMES DAILY 06/27/17   [provider]  insulin aspart protamine - aspart (NOVOLOG MIX 70/30 FLEXPEN) (70-30) 100 UNIT/ML FlexPen INJECT 35 UNITS INTO SKIN TWICE A DAY 04/02/18   [provider]  levothyroxine (SYNTHROID) 88 MCG tablet TAKE 1 TABLET BY MOUTH EVERY DAY 07/05/18   [provider]  lisinopril-hydrochlorothiazide (ZESTORETIC) 20-25 MG tablet TAKE 1 TABLET BY MOUTH EVERY DAY 05/31/18   [provider]  metFORMIN (GLUCOPHAGE) 1000 MG tablet Take 1 tablet by mouth 2 (two) times a day. 04/28/18   [provider]  oxybutynin (DITROPAN) 5 MG tablet TAKE 1 TABLET BY MOUTH TWICE A DAY FOR BLADDER 04/19/18   [provider]  simvastatin (ZOCOR) 80 MG tablet Take 1 tablet by mouth daily. 09/12/18   [provider]  venlafaxine XR (EFFEXOR-XR) 150 MG 24 hr capsule TAKE 1 CAPSULE BY MOUTH EVERY DAY 07/03/18   [provider]    Physical Exam: Vitals:   02/01/21 1415 02/01/21 1430 02/01/21 1445 02/01/21 1530  BP: (!) 93/59 104/64 (!) 88/63 (!) 107/54  Pulse: 68 70 71 72  Resp: 19 (!) 22 (!) 23 (!) 22  Temp:      TempSrc:      SpO2: 94% 99% 94% 96%  Weight:  Height:        Constitutional: NAD, calm, comfortable Vitals:   02/01/21 1415 02/01/21 1430 02/01/21 1445 02/01/21 1530  BP: (!) 93/59 104/64 (!) 88/63 (!) 107/54  Pulse: 68 70 71 72  Resp: 19 (!) 22 (!) 23 (!) 22  Temp:      TempSrc:      SpO2: 94% 99% 94% 96%  Weight:      Height:       Eyes: PERRL, lids and conjunctivae normal ENMT: Mucous membranes are moist. Posterior pharynx clear of any exudate or lesions.Normal dentition.  Neck: normal, supple, no masses, no thyromegaly Respiratory: clear to auscultation bilaterally, no wheezing, fine crackles bilateral bases.  Increasing  respiratory effort. No accessory muscle use.  Cardiovascular: Regular rate and rhythm, no murmurs / rubs / gallops. 2+ extremity edema. 2+ pedal pulses. No carotid bruits.  Abdomen: no tenderness, no masses palpated. No hepatosplenomegaly. Bowel sounds positive.  Musculoskeletal: no clubbing / cyanosis. No joint deformity upper and lower extremities. Good ROM, no contractures. Normal muscle tone.  Skin: no rashes, lesions, ulcers. No induration Neurologic: CN 2-12 grossly intact. Sensation intact, DTR normal. Strength 5/5 in all 4.  Psychiatric: Normal judgment and insight. Alert and oriented x 3. Normal mood.    Labs on Admission: I have personally reviewed following labs and imaging studies  CBC: Recent Labs  Lab 02/01/21 1205  WBC 8.1  HGB 11.0*  HCT 34.9*  MCV 99.7  PLT 595   Basic Metabolic Panel: Recent Labs  Lab 02/01/21 1205  NA 134*  K 4.1  CL 102  CO2 19*  GLUCOSE 554*  BUN 19  CREATININE 1.12*  CALCIUM 8.4*   GFR: Estimated Creatinine Clearance: 54 mL/min (A) (by C-G formula based on SCr of 1.12 mg/dL (H)). Liver Function Tests: Recent Labs  Lab 02/01/21 1205  AST 24  ALT 20  ALKPHOS 91  BILITOT 0.4  PROT 6.7  ALBUMIN 2.8*   No results for input(s): LIPASE, AMYLASE in the last 168 hours. No results for input(s): AMMONIA in the last 168 hours. Coagulation Profile: Recent Labs  Lab 02/01/21 1205  INR 1.0   Cardiac Enzymes: No results for input(s): CKTOTAL, CKMB, CKMBINDEX, TROPONINI in the last 168 hours. BNP (last 3 results) No results for input(s): PROBNP in the last 8760 hours. HbA1C: No results for input(s): HGBA1C in the last 72 hours. CBG: Recent Labs  Lab 02/01/21 1450 02/01/21 1547 02/01/21 1624  GLUCAP 502* 393* 260*   Lipid Profile: No results for input(s): CHOL, HDL, LDLCALC, TRIG, CHOLHDL, LDLDIRECT in the last 72 hours. Thyroid Function Tests: No results for input(s): TSH, T4TOTAL, FREET4, T3FREE, THYROIDAB in the last 72  hours. Anemia Panel: No results for input(s): VITAMINB12, FOLATE, FERRITIN, TIBC, IRON, RETICCTPCT in the last 72 hours. Urine analysis: No results found for: COLORURINE, APPEARANCEUR, LABSPEC, [place], GLUCOSEU, HGBUR, BILIRUBINUR, KETONESUR, PROTEINUR, UROBILINOGEN, NITRITE, LEUKOCYTESUR  Radiological Exams on Admission: DG Chest 2 View  Result Date: 02/01/2021 CLINICAL DATA:  Cough, shortness of breath. EXAM: CHEST - 2 VIEW COMPARISON:  None. FINDINGS: The heart size and mediastinal contours are within normal limits. Possible minimal bibasilar subsegmental atelectasis or edema is noted with probable small pleural effusions. The visualized skeletal structures are unremarkable. IMPRESSION: Possible minimal bibasilar subsegmental atelectasis or edema with probable small pleural effusions. Electronically Signed   By: Marijo Conception M.D.   On: 02/01/2021 14:25   CT Angio Chest PE W and/or Wo Contrast  Result Date: 02/01/2021 CLINICAL  DATA:  Positive D-dimer level. EXAM: CT ANGIOGRAPHY CHEST WITH CONTRAST TECHNIQUE: Multidetector CT imaging of the chest was performed using the standard protocol during bolus administration of intravenous contrast. Multiplanar CT image reconstructions and MIPs were obtained to evaluate the vascular anatomy. RADIATION DOSE REDUCTION: This exam was performed according to the departmental dose-optimization program which includes automated exposure control, adjustment of the mA and/or kV according to patient size and/or use of iterative reconstruction technique. CONTRAST:  54m OMNIPAQUE IOHEXOL 350 MG/ML SOLN COMPARISON:  None. FINDINGS: Cardiovascular: Satisfactory opacification of the pulmonary arteries to the segmental level. No evidence of pulmonary embolism. Normal heart size. No pericardial effusion. Coronary artery calcifications are noted. Mediastinum/Nodes: Thyroid gland and esophagus are unremarkable. Mildly enlarged mediastinal lymph nodes are noted, largest being  subcarinal lymph node measuring 13 mm, which most likely are reactive or inflammatory in etiology. Lungs/Pleura: No pneumothorax is noted. Mild to moderate bilateral pleural effusions are noted. Probable bilateral pulmonary edema is noted. Upper Abdomen: No acute abnormality. Musculoskeletal: No chest wall abnormality. No acute or significant osseous findings. Review of the MIP images confirms the above findings. IMPRESSION: No definite evidence of pulmonary embolus. Bilateral pulmonary edema is noted with mild to moderate bilateral pleural effusions. Coronary artery calcifications are noted suggesting coronary artery disease. Mildly enlarged mediastinal adenopathy is noted which most likely is reactive or inflammatory in etiology. Aortic Atherosclerosis (ICD10-I70.0). Electronically Signed   By: JMarijo ConceptionM.D.   On: 02/01/2021 15:53    EKG: Independently reviewed.  Sinus, ST-T changes on multiple leads II, III, aVF, V3 through V6.  Appears to have some Q wave on V1 and V2 but no previous EKG to compare with.  Assessment/Plan Principal Problem:   NSTEMI (non-ST elevated myocardial infarction) ([place] Problems:   Acute CHF (congestive heart failure) [place]   COVID-19 virus infection  (please populate well all problems here in Problem List. (For example, if patient is on BP meds at home and you resume or decide to hold them, it is a problem that needs to be her. Same for CAD, COPD, HLD and so on)  NSTEMI -Continue ASA and lipitor -Continue beta-blocker -Hold lisinopril for borderline hypotension -Continue heparin drip -N.p.o. after midnight in case cardiology PCU cardiac cath tomorrow. -Recheck EKG tomorrow morning and troponin level tomorrow morning. -CTA negative for PE but appears to have significant coronary artery calcifications equaling to CAD.  Acute/subacute CHF decompensation -LVEF depressed as per verbal report, waiting for formal report. -Symptoms and signs of fluid  overload, but BP borderline low to allow diuresis this afternoon.  COVID-19 infection -Home test negative last week implying patient contracted COVID infection this week. Given she also has NSTEMI and CHF, decided to treat 3 days remdesivir and steroid, explained to patient who expressed understanding agreed. -COVID labs -CTA negative for PE  IDDM with hyperglycemia -Fingerstick trending down 500>300>200, will discontinue heparin drip and switch to sliding scale.  HTN -Now with borderline hypotension likely from CHF, will hold off lisinopril/HCTZ.  Morbid obesity -BMI> 40, discussed with patient about calorie control.  DVT prophylaxis: Heparin drip Code Status: Full code Family Communication: None at bedside Disposition Plan: Patient is sick with multiple conditions including NSTEMI, CHF and COVID infection, expect more than 2 midnight hospital stay. Consults called: Cardiology Admission status: PCU   PLequita HaltMD [facility] Pager [phone] 02/01/2021, 4:45 PM

## 2021-02-01 NOTE — ED Provider Notes (Signed)
[facility]   CSN: [id number] Arrival date & time: 02/01/21  1205     History  Chief Complaint  Patient presents with   Nausea   Fatigue   Shortness of Breath    Basil DessLinda C Huy is a 71 y.o. female.  Patient with history of diabetes, high cholesterol, hypertension presents to the emergency department for evaluation of cough, shortness of breath, nausea with one episode of vomiting, fatigue over the past 3 weeks.  Symptoms started gradually and worsened.  Patient states that it was mainly a cough at first and is progressed to shortness of breath with exertion and worse with lying flat.  She has noted some swelling in her bilateral lower extremities.  She denies any persistent fevers, URI symptoms.  No vomiting or diarrhea.  She does not have a history of cardiac disease and has had some pressure in her chest for 2 weeks.  She has had no acutely worsening chest pain or discomfort, shortness of breath, fatigue.        Home Medications Prior to Admission medications   Medication Sig Start Date End Date Taking? Authorizing Provider  aspirin EC 81 MG tablet Take 1 tablet by mouth daily. 07/14/15   [provider]  bisoprolol (ZEBETA) 5 MG tablet Take 5 mg by mouth daily. 03/19/18   [provider]  fenofibrate 160 MG tablet Take 160 mg by mouth daily. 05/12/18   [provider]  glipiZIDE (GLUCOTROL) 10 MG tablet TAKE 1 TABLET (10 MG TOTAL) BY MOUTH 2 TIMES DAILY BEFORE MEALS. 05/30/18   [provider]  glucose blood (ONETOUCH ULTRA) test strip USE TO TEST 3 TIMES DAILY 06/27/17   [provider]  insulin aspart protamine - aspart (NOVOLOG MIX 70/30 FLEXPEN) (70-30) 100 UNIT/ML FlexPen INJECT 35 UNITS INTO SKIN TWICE A DAY 04/02/18   [provider]  levothyroxine (SYNTHROID) 88 MCG tablet TAKE 1 TABLET BY MOUTH EVERY DAY 07/05/18   [provider]  lisinopril-hydrochlorothiazide  (ZESTORETIC) 20-25 MG tablet TAKE 1 TABLET BY MOUTH EVERY DAY 05/31/18   [provider]  metFORMIN (GLUCOPHAGE) 1000 MG tablet Take 1 tablet by mouth 2 (two) times a day. 04/28/18   [provider]  oxybutynin (DITROPAN) 5 MG tablet TAKE 1 TABLET BY MOUTH TWICE A DAY FOR BLADDER 04/19/18   [provider]  simvastatin (ZOCOR) 80 MG tablet Take 1 tablet by mouth daily. 09/12/18   [provider]  venlafaxine XR (EFFEXOR-XR) 150 MG 24 hr capsule TAKE 1 CAPSULE BY MOUTH EVERY DAY 07/03/18   [provider]      Allergies    Patient has no known allergies.    Review of Systems   Review of Systems  Physical Exam Updated Vital Signs BP 121/81    Pulse 73    Temp 97.8 F (36.6 C) (Oral)    Resp 14    Ht 5\' 3"  (1.6 m)    Wt 104.3 kg    SpO2 98%    BMI 40.74 kg/m   Physical Exam Vitals and nursing note reviewed.  Constitutional:      Appearance: She is well-developed. She is not diaphoretic.  HENT:     Head: Normocephalic and atraumatic.     Mouth/Throat:     Mouth: Mucous membranes are not dry.  Eyes:     Conjunctiva/sclera: Conjunctivae normal.  Neck:     Vascular: Normal carotid pulses. No JVD.  Trachea: Trachea normal. No tracheal deviation.  Cardiovascular:     Rate and Rhythm: Normal rate and regular rhythm.     Pulses: No decreased pulses.          Radial pulses are 2+ on the right side and 2+ on the left side.     Heart sounds: Normal heart sounds, S1 normal and S2 normal. No murmur heard. Pulmonary:     Effort: Pulmonary effort is normal. No respiratory distress.     Breath sounds: Examination of the right-lower field reveals rales. Examination of the left-lower field reveals rales. Rales present. No wheezing.  Chest:     Chest wall: No tenderness.  Abdominal:     General: Bowel sounds are normal.     Palpations: Abdomen is soft.     Tenderness: There is no abdominal tenderness. There is no guarding or rebound.  Musculoskeletal:         General: Normal range of motion.     Cervical back: Normal range of motion and neck supple. No muscular tenderness.     Right lower leg: Edema present.     Left lower leg: Edema present.     Comments: 1-2+ lower extremity edema  Skin:    General: Skin is warm and dry.     Coloration: Skin is not pale.  Neurological:     Mental Status: She is alert.    ED Results / Procedures / Treatments   Labs (all labs ordered are listed, but only abnormal results are displayed) Labs Reviewed  RESP PANEL BY RT-PCR (FLU A&B, COVID) ARPGX2 - Abnormal; Notable for the following components:      Result Value   SARS Coronavirus 2 by RT PCR POSITIVE (*)    All other components within normal limits  CBC - Abnormal; Notable for the following components:   RBC 3.50 (*)    Hemoglobin 11.0 (*)    HCT 34.9 (*)    All other components within normal limits  COMPREHENSIVE METABOLIC PANEL - Abnormal; Notable for the following components:   Sodium 134 (*)    CO2 19 (*)    Glucose, Bld 554 (*)    Creatinine, Ser 1.12 (*)    Calcium 8.4 (*)    Albumin 2.8 (*)    GFR, Estimated 53 (*)    All other components within normal limits  BRAIN NATRIURETIC PEPTIDE - Abnormal; Notable for the following components:   B Natriuretic Peptide 647.1 (*)    All other components within normal limits  D-DIMER, QUANTITATIVE - Abnormal; Notable for the following components:   D-Dimer, Quant 0.82 (*)    All other components within normal limits  CBG MONITORING, ED - Abnormal; Notable for the following components:   Glucose-Capillary 502 (*)    All other components within normal limits  CBG MONITORING, ED - Abnormal; Notable for the following components:   Glucose-Capillary 393 (*)    All other components within normal limits  TROPONIN I (HIGH SENSITIVITY) - Abnormal; Notable for the following components:   Troponin I (High Sensitivity) 973 (*)    All other components within normal limits  URINALYSIS, ROUTINE W  REFLEX MICROSCOPIC  PROTIME-INR  HEPARIN LEVEL (UNFRACTIONATED)  BASIC METABOLIC PANEL  TROPONIN I (HIGH SENSITIVITY)    EKG EKG Interpretation  Date/Time:  Sunday February 01 2021 14:06:30 EST Ventricular Rate:  68 PR Interval:  192 QRS Duration: 116 QT Interval:  437 QTC Calculation: 465 R Axis:   -66 Text Interpretation: Sinus rhythm  Nonspecific IVCD with LAD Left ventricular hypertrophy No significant change since last tracing Confirmed by Melene PlanFloyd, Dan [phone]([id number]) on 02/01/2021 3:30:37 PM  Radiology DG Chest 2 View  Result Date: 02/01/2021 CLINICAL DATA:  Cough, shortness of breath. EXAM: CHEST - 2 VIEW COMPARISON:  None. FINDINGS: The heart size and mediastinal contours are within normal limits. Possible minimal bibasilar subsegmental atelectasis or edema is noted with probable small pleural effusions. The visualized skeletal structures are unremarkable. IMPRESSION: Possible minimal bibasilar subsegmental atelectasis or edema with probable small pleural effusions. Electronically Signed   By: Lupita RaiderJames  Green Jr M.D.   On: 02/01/2021 14:25   CT Angio Chest PE W and/or Wo Contrast  Result Date: 02/01/2021 CLINICAL DATA:  Positive D-dimer level. EXAM: CT ANGIOGRAPHY CHEST WITH CONTRAST TECHNIQUE: Multidetector CT imaging of the chest was performed using the standard protocol during bolus administration of intravenous contrast. Multiplanar CT image reconstructions and MIPs were obtained to evaluate the vascular anatomy. RADIATION DOSE REDUCTION: This exam was performed according to the departmental dose-optimization program which includes automated exposure control, adjustment of the mA and/or kV according to patient size and/or use of iterative reconstruction technique. CONTRAST:  75mL OMNIPAQUE IOHEXOL 350 MG/ML SOLN COMPARISON:  None. FINDINGS: Cardiovascular: Satisfactory opacification of the pulmonary arteries to the segmental level. No evidence of pulmonary embolism. Normal heart size. No  pericardial effusion. Coronary artery calcifications are noted. Mediastinum/Nodes: Thyroid gland and esophagus are unremarkable. Mildly enlarged mediastinal lymph nodes are noted, largest being subcarinal lymph node measuring 13 mm, which most likely are reactive or inflammatory in etiology. Lungs/Pleura: No pneumothorax is noted. Mild to moderate bilateral pleural effusions are noted. Probable bilateral pulmonary edema is noted. Upper Abdomen: No acute abnormality. Musculoskeletal: No chest wall abnormality. No acute or significant osseous findings. Review of the MIP images confirms the above findings. IMPRESSION: No definite evidence of pulmonary embolus. Bilateral pulmonary edema is noted with mild to moderate bilateral pleural effusions. Coronary artery calcifications are noted suggesting coronary artery disease. Mildly enlarged mediastinal adenopathy is noted which most likely is reactive or inflammatory in etiology. Aortic Atherosclerosis (ICD10-I70.0). Electronically Signed   By: Lupita RaiderJames  Green Jr M.D.   On: 02/01/2021 15:53    Procedures Procedures    Medications Ordered in ED Medications - No data to display  ED Course/ Medical Decision Making/ A&P   12:35 PM Patient seen and examined. History obtained directly from patient. EKG reviewed with Dr. Adela LankFloyd just prior to H&P.  Patient has ST segment changes without reciprocal depression.  After discussion with patient, she has no acutely changing or worsening chest pain/pressure, shortness of breath or other symptoms which could be considered an anginal equivalent over the past several days.  Therefore no STEMI called. Patient with history of diabetes, hypertension, high cholesterol as discussed in HPI, she does have cough for 3 weeks and worsening shortness of breath with exertion and lying flat over the past 2 weeks.    Labs/EKG: Ordered CBC, CMP, BNP, troponin, EKG, d-dimer.   Imaging: Ordered CXR.  Medications/Fluids: None.   Most recent  vital signs reviewed and are as follows: BP 121/81    Pulse 73    Temp 97.8 F (36.6 C) (Oral)    Resp 14    Ht 5\' 3"  (1.6 m)    Wt 104.3 kg    SpO2 98%    BMI 40.74 kg/m   Initial impression: Concern for CHF exacerbation, PNA, PE.   1:58 PM D-dimer is elevated, as well as BNP.  Troponin in 900s.  Will repeat EKG. Blood sugar in 500s without signs of DKA.  2:07 PM CXR reviewed, agree with possible mild edema.  No focal consolidation.  2:45 PM Reassessment performed. Patient appears comfortable, no distress.   Reviewed pertinent lab work and imaging with patient at bedside including: Elevated troponin, elevated D-dimer, elevated BNP, high blood sugar.    Most current vital signs reviewed and are as follows: BP 108/64    Pulse 69    Temp 97.8 F (36.6 C) (Oral)    Resp 17    Ht 5\' 3"  (1.6 m)    Wt 104.3 kg    SpO2 93%    BMI 40.74 kg/m   Plan: We will initialize on glucose stabilizer protocol without fluid bolus due to suspected fluid overload.  3:16 PM Consulted with Dr. of cardiology who will see patient. Reccs med admit, heparin.   3:29 PM CT imaging independently reviewed and interpreted.  Patient with bilateral pleural effusions.  No obvious PE.  Heparin ordered as no obvious contraindications.  Awaiting radiology read.  Will discuss with hospitalist team.   4:00 PM consulted with Dr. Odis Hollingshead of [facility] who will see patient.   BP (!) 107/54    Pulse 72    Temp 97.8 F (36.6 C) (Oral)    Resp (!) 22    Ht 5\' 3"  (1.6 m)    Wt 104.3 kg    SpO2 96%    BMI 40.74 kg/m                            Medical Decision Making Amount and/or Complexity of Data Reviewed Labs: ordered. Radiology: ordered.   Admit. Patient with suspected new CHF symptoms, COVID. She has abnormal EKG with elevated troponins. Symptoms ongoing for 2-3 weeks, she does not describe significant new or recent worsening. Also uncontrolled blood sugar without DKA.   CRITICAL CARE Performed by: Chipper Herb PA-C Total critical care time: 45 minutes Critical care time was exclusive of separately billable procedures and treating other patients. Critical care was necessary to treat or prevent imminent or life-threatening deterioration. Critical care was time spent personally by me on the following activities: development of treatment plan with patient and/or surrogate as well as nursing, discussions with consultants, evaluation of patient's response to treatment, examination of patient, obtaining history from patient or surrogate, ordering and performing treatments and interventions, ordering and review of laboratory studies, ordering and review of radiographic studies, pulse oximetry and re-evaluation of patient's condition.            Final Clinical Impression(s) / ED Diagnoses Final diagnoses:  Shortness of breath  Pleural effusion  Elevated troponin I level  Abnormal EKG  COVID-19  Hyperglycemia without ketosis    Rx / DC Orders ED Discharge Orders     None         , PA-C 02/01/21 1604    Renne Crigler, DO 02/02/21 [phone]

## 2021-02-01 NOTE — Progress Notes (Signed)
ANTICOAGULATION CONSULT NOTE - Initial Consult  Pharmacy Consult for Heparin Indication: chest pain/ACS  No Known Allergies  Patient Measurements: Height: 5\' 3"  (160 cm) Weight: 104.3 kg (230 lb) IBW/kg (Calculated) : 52.4 Heparin Dosing Weight: 77.1 kg  Vital Signs: Temp: 97.8 F (36.6 C) (01/29 1223) Temp Source: Oral (01/29 1223) BP: 107/54 (01/29 1530) Pulse Rate: 72 (01/29 1530)  Labs: Recent Labs    02/01/21 1205  HGB 11.0*  HCT 34.9*  PLT 269  CREATININE 1.12*  TROPONINIHS 973*    Estimated Creatinine Clearance: 54 mL/min (A) (by C-G formula based on SCr of 1.12 mg/dL (H)).   Medical History: No past medical history on file.  Medications:  (Not in a hospital admission)  Scheduled:   heparin  4,000 Units Intravenous Once   Infusions:   dextrose 5% lactated ringers     heparin     insulin 13 Units/hr (02/01/21 1548)   lactated ringers 125 mL/hr at 02/01/21 1453   PRN: dextrose  Assessment: 70 yof with a history of diabetes, high cholesterol, hypertension . Patient noted to have elevated troponin in the ED. Heparin per pharmacy consult placed for chest pain/ACS.  Patient is not on anticoagulation prior to arrival.  Hgb 11; plt 269  Goal of Therapy:  Heparin level 0.3-0.7 units/ml Monitor platelets by anticoagulation protocol: Yes   Plan:  Give 4000 units bolus x 1 Start heparin infusion at 1000 units/hr Check anti-Xa level in 8 hours and daily while on heparin Continue to monitor H&H and platelets  02/03/21, PharmD, BCPS 02/01/2021 3:55 PM ED Clinical Pharmacist -  [phone]

## 2021-02-01 NOTE — Progress Notes (Signed)
BP continue to be on the lower side, will use Albumin to hydrate, D/W Cardiology Dr. Elayne Snare at bedside.

## 2021-02-01 NOTE — ED Notes (Signed)
Updated Dr. Chipper Herb about BP continuing to be 83/52. Dr.Tolia also aware. Stating fluids can be given for support but defaulting do Dr. Chipper Herb to place orders for this. Awaiting orders at this time.

## 2021-02-01 NOTE — Consult Note (Signed)
CARDIOLOGY CONSULT NOTE  Patient ID: Mallory Butler MRN: [id number] DOB/AGE: 1950-05-21 71 y.o.  Admit date: 02/01/2021 Attending physician: Lequita Halt, MD Primary Physician:  Janie Morning, DO Outpatient Cardiologist: NA Inpatient Cardiologist: Rex Kras, DO, [facility]  Reason of consultation: NSTEMI Referring physician: Carlisle Cater, PA-C  Chief complaint: NSTEMI  HPI:  Mallory Butler is a 71 y.o. Caucasian female who presents with a chief complaint of "cough, chest pain." Her past medical history and cardiovascular risk factors include: IDDM Type II, HTN, HLD, postmenopausal female.  Recently her son was involved in a motor vehicle accident therefore patient was coming back and forth account for the last 5 weeks.  Approximately 3 weeks ago started developing upper respiratory tract symptoms and started using Mucinex, flu medications, cough drops.  Patient states that a week ago she did check a home COVID test which was negative.  However due to progressive nonproductive cough, shortness of breath with exertion and chest pain she presents to the hospital for for further evaluation and management.  Given her symptoms of precordial discomfort and elevated troponins cardiology is consulted during his hospitalization.  Chest pain: Started approximately 2 weeks ago, progressively worse, now brought on by effort related activities such as going to the bathroom and back, improves with rest, pressure-like sensation, intensity 5 out of 10, lasting for approximately 2 minutes per patient.  Last episode was yesterday night.  Currently denies chest pain.  Notable labs on admission: Sodium 134 (pseudohyponatremia), glucose 554, serum creatinine 1.12 mg/dL, albumin 2.8, BNP 647, high sensitive troponins (973 followed by 1058), hemoglobin 11 g/dL.  COVID test positive.  CT PE protocol: Negative for pulmonary embolism, bilateral pleural effusion with pulmonary edema, coronary artery calcification,  aortic atherosclerosis, enlarged mediastinal adenopathy likely reactive or inflammatory.  ALLERGIES: No Known Allergies  PAST MEDICAL HISTORY: Insulin-dependent diabetes mellitus type 2. Hypertension. Hyperlipidemia  PAST SURGICAL HISTORY: Knee scope 1991 Partial hysterectomy   FAMILY HISTORY: No family history of premature coronary artery disease or sudden cardiac death   SOCIAL HISTORY:  Denies alcohol use, cigarette smoking, or recreational drug use.  MEDICATIONS: Current Outpatient Medications  Medication Instructions   aspirin EC 81 MG tablet 1 tablet, Oral, Daily   bisoprolol (ZEBETA) 10 mg, Oral, Daily   glipiZIDE (GLUCOTROL) 10 mg, Oral, 2 times daily before meals   glucose blood (ONETOUCH ULTRA) test strip USE TO TEST 3 TIMES DAILY   insulin aspart protamine - aspart (NOVOLOG MIX 70/30 FLEXPEN) (70-30) 100 UNIT/ML FlexPen 35 Units, 2 times daily with meals   levothyroxine (SYNTHROID) 88 mcg, Oral, Daily before breakfast   lisinopril (ZESTRIL) 20 mg, Oral, Daily   metFORMIN (GLUCOPHAGE) 1000 MG tablet 1 tablet, Oral, 2 times daily   mirabegron ER (MYRBETRIQ) 50 mg, Oral, Daily   oxybutynin (DITROPAN) 5 mg, Oral, 3 times daily, TAKE 1 TABLET BY MOUTH TWICE A DAY FOR BLADDER   simvastatin (ZOCOR) 80 MG tablet 1 tablet, Oral, Daily   venlafaxine XR (EFFEXOR-XR) 150 mg, Daily with breakfast    REVIEW OF SYSTEMS: Review of Systems  Constitutional: Positive for malaise/fatigue. Negative for chills and fever.  HENT:  Negative for hoarse voice and nosebleeds.   Eyes:  Negative for discharge, double vision and pain.  Cardiovascular:  Positive for chest pain and dyspnea on exertion. Negative for claudication, leg swelling, near-syncope, orthopnea, palpitations, paroxysmal nocturnal dyspnea and syncope.  Respiratory:  Positive for shortness of breath. Negative for hemoptysis.   Musculoskeletal:  Negative for muscle cramps and  myalgias.  °Gastrointestinal:  Negative for  abdominal pain, constipation, diarrhea, hematemesis, hematochezia, melena, nausea and vomiting.  °Neurological:  Negative for dizziness and light-headedness.  °All other systems reviewed and are negative. ° °PHYSICAL EXAM: °Vitals with BMI 02/01/2021 02/01/2021 02/01/2021  °Height - - -  °Weight - - -  °BMI - - -  °Systolic 107 88 104  °Diastolic 54 63 64  °Pulse 72 71 70  ° ° °Intake/Output Summary (Last 24 hours) at 02/01/2021 1857 °Last data filed at 02/01/2021 1704 °Gross per 24 hour  °Intake 248.06 ml  °Output --  °Net 248.06 ml  °  °Net IO Since Admission: 248.06 mL [02/01/21 1857] ° °CONSTITUTIONAL: Appears older than stated age, obese, no acute distress.  °SKIN: Skin is warm and dry. No rash noted. No cyanosis. No pallor. No jaundice °HEAD: Normocephalic and atraumatic.  °EYES: No scleral icterus °MOUTH/THROAT: Dry oral membranes.  °NECK: JVP difficult to evaluate due to short neck stature and adipose tissue.  No thyromegaly noted. No carotid bruits  °LYMPHATIC: No visible cervical adenopathy.  °CHEST Normal respiratory effort. No intercostal retractions  °LUNGS: Clear to auscultation upper lung fields with decreased breath sounds at the bases.  No stridor. No wheezes.  Bilateral rales.  °CARDIOVASCULAR: Regular rate and rhythm, positive S1-S2, no murmurs rubs or gallops appreciated °ABDOMINAL: Obese, soft, nontender, nondistended, positive bowel sounds in all 4 quadrants, no apparent ascites.  °EXTREMITIES: Warm to touch, +2 ankle and feet pitting edema, palpable DP and PT pulses bilaterally.  °HEMATOLOGIC: No significant bruising °NEUROLOGIC: Oriented to person, place, and time. Nonfocal. Normal muscle tone.  °PSYCHIATRIC: Normal mood and affect. Normal behavior. Cooperative ° °RADIOLOGY: °DG Chest 2 View ° °Result Date: 02/01/2021 °CLINICAL DATA:  Cough, shortness of breath. EXAM: CHEST - 2 VIEW COMPARISON:  None. FINDINGS: The heart size and mediastinal contours are within normal limits. Possible minimal  bibasilar subsegmental atelectasis or edema is noted with probable small pleural effusions. The visualized skeletal structures are unremarkable. IMPRESSION: Possible minimal bibasilar subsegmental atelectasis or edema with probable small pleural effusions. Electronically Signed   By: James  Green Jr M.D.   On: 02/01/2021 14:25  ° °CT Angio Chest PE W and/or Wo Contrast ° °Result Date: 02/01/2021 °CLINICAL DATA:  Positive D-dimer level. EXAM: CT ANGIOGRAPHY CHEST WITH CONTRAST TECHNIQUE: Multidetector CT imaging of the chest was performed using the standard protocol during bolus administration of intravenous contrast. Multiplanar CT image reconstructions and MIPs were obtained to evaluate the vascular anatomy. RADIATION DOSE REDUCTION: This exam was performed according to the departmental dose-optimization program which includes automated exposure control, adjustment of the mA and/or kV according to patient size and/or use of iterative reconstruction technique. CONTRAST:  75mL OMNIPAQUE IOHEXOL 350 MG/ML SOLN COMPARISON:  None. FINDINGS: Cardiovascular: Satisfactory opacification of the pulmonary arteries to the segmental level. No evidence of pulmonary embolism. Normal heart size. No pericardial effusion. Coronary artery calcifications are noted. Mediastinum/Nodes: Thyroid gland and esophagus are unremarkable. Mildly enlarged mediastinal lymph nodes are noted, largest being subcarinal lymph node measuring 13 mm, which most likely are reactive or inflammatory in etiology. Lungs/Pleura: No pneumothorax is noted. Mild to moderate bilateral pleural effusions are noted. Probable bilateral pulmonary edema is noted. Upper Abdomen: No acute abnormality. Musculoskeletal: No chest wall abnormality. No acute or significant osseous findings. Review of the MIP images confirms the above findings. IMPRESSION: No definite evidence of pulmonary embolus. Bilateral pulmonary edema is noted with mild to moderate bilateral pleural  effusions. Coronary artery calcifications are noted   suggesting coronary artery disease. Mildly enlarged mediastinal adenopathy is noted which most likely is reactive or inflammatory in etiology. Aortic Atherosclerosis (ICD10-I70.0). Electronically Signed   By: Marijo Conception M.D.   On: 02/01/2021 15:53   ECHOCARDIOGRAM LIMITED  Result Date: 02/01/2021    ECHOCARDIOGRAM REPORT   Patient Name:   Mallory Butler Date of Exam: 02/01/2021 Medical Rec #:  [id number]     Height:       63.0 in Accession #:    [id number]    Weight:       230.0 lb Date of Birth:  1950-09-19     BSA:          2.052 m Patient Age:    83 years      BP:           88/63 mmHg Patient Gender: F             HR:           76 bpm. Exam Location:  Inpatient Procedure: 2D Echo, Limited Color Doppler and Cardiac Doppler Indications:    NSTEMI  History:        Patient has no prior history of Echocardiogram examinations.                 Covid; Risk Factors:Diabetes, Hypertension and Sleep Apnea.  Sonographer:    Johny Chess RDCS Referring Phys: [id number]  Sonographer Comments: Patient is morbidly obese. Could not remain still. IMPRESSIONS  1. Left ventricular ejection fraction, by estimation, is 30 to 35%. The left ventricle has moderately decreased function. The left ventricle demonstrates regional wall motion abnormalities (see scoring diagram/findings for description). There is mild left ventricular hypertrophy. Left ventricular diastolic parameters are consistent with Grade I diastolic dysfunction (impaired relaxation). Elevated left ventricular end-diastolic pressure. The mid and distal anterior wall, entire anterior septum, entire apex, mid and distal inferior wall, and mid inferoseptal segment are hypokinetic.  2. Right ventricular systolic function is normal. The right ventricular size is normal. There is normal pulmonary artery systolic pressure. The estimated right ventricular systolic pressure is 99991111 mmHg.  3. The mitral valve  is degenerative. Mild mitral valve regurgitation. No evidence of mitral stenosis.  4. The aortic valve was not well visualized. Aortic valve regurgitation is not visualized. No aortic stenosis is present.  5. The inferior vena cava is dilated in size with <50% respiratory variability, suggesting right atrial pressure of 15 mmHg. Conclusion(s)/Recommendation(s): No left ventricular mural or apical thrombus/thrombi. FINDINGS  Left Ventricle: The mid and distal anterior wall, entire anterior septum, entire apex, mid and distal inferior wall, and mid inferoseptal segment are hypokinetic. Left ventricular ejection fraction, by estimation, is 30 to 35%. The left ventricle has moderately decreased function. The left ventricle demonstrates regional wall motion abnormalities. Definity contrast agent was given IV to delineate the left ventricular endocardial borders. The left ventricular internal cavity size was normal in size. There is mild left ventricular hypertrophy. Left ventricular diastolic parameters are consistent with Grade I diastolic dysfunction (impaired relaxation). Elevated left ventricular end-diastolic pressure.  LV Wall Scoring: The mid and distal anterior wall, entire anterior septum, entire apex, mid and distal inferior wall, and mid inferoseptal segment are hypokinetic. Right Ventricle: The right ventricular size is normal. No increase in right ventricular wall thickness. Right ventricular systolic function is normal. There is normal pulmonary artery systolic pressure. The tricuspid regurgitant velocity is 2.13 m/s, and  with an assumed right atrial pressure of 8 mmHg,  the estimated right ventricular systolic pressure is 99991111 mmHg. Left Atrium: Left atrial size was normal in size. Right Atrium: Right atrial size was normal in size. Pericardium: There is no evidence of pericardial effusion. Mitral Valve: The mitral valve is degenerative in appearance. There is mild thickening of the mitral valve  leaflet(s). There is mild calcification of the mitral valve leaflet(s). Mildly decreased mobility of the mitral valve leaflets. Mild mitral valve regurgitation. No evidence of mitral valve stenosis. Tricuspid Valve: The tricuspid valve is normal in structure. Tricuspid valve regurgitation is mild . No evidence of tricuspid stenosis. Aortic Valve: The aortic valve was not well visualized. Aortic valve regurgitation is not visualized. No aortic stenosis is present. Pulmonic Valve: The pulmonic valve was not well visualized. Pulmonic valve regurgitation is not visualized. No evidence of pulmonic stenosis. Aorta: The aortic root and ascending aorta are structurally normal, with no evidence of dilitation. Venous: The inferior vena cava is dilated in size with less than 50% respiratory variability, suggesting right atrial pressure of 15 mmHg. IAS/Shunts: No atrial level shunt detected by color flow Doppler.  LEFT VENTRICLE PLAX 2D LVIDd:         5.70 cm      Diastology LVIDs:         4.00 cm      LV e' medial:    5.11 cm/s LV PW:         1.50 cm      LV E/e' medial:  21.3 LV IVS:        1.10 cm      LV e' lateral:   5.98 cm/s LVOT diam:     1.90 cm      LV E/e' lateral: 18.2 LVOT Area:     2.84 cm  LV Volumes (MOD) LV vol d, MOD A2C: 150.0 ml LV vol d, MOD A4C: 129.0 ml LV vol s, MOD A2C: 118.0 ml LV vol s, MOD A4C: 96.7 ml LV SV MOD A2C:     32.0 ml LV SV MOD A4C:     129.0 ml LV SV MOD BP:      32.5 ml IVC IVC diam: 2.60 cm LEFT ATRIUM         Index       RIGHT ATRIUM           Index LA diam:    5.20 cm 2.53 cm/m  RA Area:     14.20 cm                                 RA Volume:   35.50 ml  17.30 ml/m   AORTA Ao Asc diam: 3.10 cm MITRAL VALVE                TRICUSPID VALVE MV Area (PHT): 3.85 cm     TR Peak grad:   18.1 mmHg MV Decel Time: 197 msec     TR Vmax:        213.00 cm/s MV E velocity: 109.00 cm/s MV A velocity: 69.00 cm/s   SHUNTS MV E/A ratio:  1.58         Systemic Diam: 1.90 cm Naeem Quillin DO  Electronically signed by Rex Kras DO Signature Date/Time: 02/01/2021/6:22:49 PM    Final     LABORATORY DATA: Lab Results  Component Value Date   WBC 8.1 02/01/2021   HGB 11.0 (L) 02/01/2021   HCT 34.9 (L)  02/01/2021   MCV 99.7 02/01/2021   PLT 269 02/01/2021    Recent Labs  Lab 02/01/21 1205  NA 134*  K 4.1  CL 102  CO2 19*  BUN 19  CREATININE 1.12*  CALCIUM 8.4*  PROT 6.7  BILITOT 0.4  ALKPHOS 91  ALT 20  AST 24  GLUCOSE 554*    Lipid Panel  No results found for: CHOL, HDL, LDLCALC, LDLDIRECT, TRIG, CHOLHDL  BNP (last 3 results) Recent Labs    02/01/21 1205  BNP 647.1*    HEMOGLOBIN A1C No results found for: HGBA1C, MPG  Cardiac Panel (last 3 results) Recent Labs    02/01/21 1205 02/01/21 1448  TROPONINIHS 973* 1,058*     TSH No results for input(s): TSH in the last 8760 hours.   CARDIAC DATABASE: EKG: 02/01/2021: NSR, 72 bpm, LVH per voltage criteria,PRWP, subtle ST-T changes.  02/01/2021: NSR, 68 bpm, LVH per voltage criteria, poor R wave progression, suble ST elevation V3-V6 without reciprocal changes.  02/01/2021: NSR, 72 bpm, LVH voltage criteria, PR WP, suble ST elevation V3-V6 without reciprocal changes, similar to prior EKG.  Echocardiogram:  1. Left ventricular ejection fraction, by estimation, is 30 to 35%. The  left ventricle has moderately decreased function. The left ventricle  demonstrates regional wall motion abnormalities (see scoring  diagram/findings for description). There is mild  left ventricular hypertrophy. Left ventricular diastolic parameters are  consistent with Grade I diastolic dysfunction (impaired relaxation).  Elevated left ventricular end-diastolic pressure. The mid and distal  anterior wall, entire anterior septum,  entire apex, mid and distal inferior wall, and mid inferoseptal segment  are hypokinetic.   2. Right ventricular systolic function is normal. The right ventricular  size is normal. There is normal  pulmonary artery systolic pressure. The  estimated right ventricular systolic pressure is 99991111 mmHg.   3. The mitral valve is degenerative. Mild mitral valve regurgitation. No  evidence of mitral stenosis.   4. The aortic valve was not well visualized. Aortic valve regurgitation  is not visualized. No aortic stenosis is present.   5. The inferior vena cava is dilated in size with <50% respiratory  variability, suggesting right atrial pressure of 15 mmHg.   Conclusion(s)/Recommendation(s): No left ventricular mural or apical  thrombus/thrombi.    IMPRESSION & RECOMMENDATIONS: Mallory Butler is a 71 y.o. Caucasian female whose past medical history and cardiovascular risk factors include: IDDM Type II, HTN, HLD, postmenopausal female.  Impression:  Non-STEMI COVID-19 infection Hyperglycemia, insulin-dependent diabetes mellitus type 2 Elevated troponin secondary to non-STEMI Elevated BNP Coronary artery calcification -nongated CT study Aortic atherosclerosis Hypertension with chronic kidney disease stage III Dependent diabetes with complications of CKD, hyperlipidemia, hyperglycemia Obesity due to excess calories  Plan:  Cardiology consulted for NSTEMI evaluation.  Currently does not have any active chest pain.  Troponins have elevated and climbing.  EKG showed sinus rhythm with subtle ST elevations in V3-V6 without reciprocal changes.  Given her presentation, elevated troponins, EKG, and soft blood pressures spoke to interventional cardiologist Dr. Einar Gip on-call who recommends no emergent interventions as she is symptom-free.  Patient is advised to report any anginal discomfort she experiences during her current hospitalization.  Echocardiogram notes moderately reduced LVEF, with regional wall motion abnormalities.  Given the regional wall motion abnormalities the differential diagnosis also includes Takotsubo cardiomyopathy in the setting of recent family stressors and COVID-19.   However given her risk factors of insulin-dependent diabetes, coronary calcification, and other comorbid conditions obstructive CAD cannot be  ruled out.  Had a long discussion with the patient with regards to proceeding with invasive right and left catheterization.   The procedure of left and right heart catheterization with possible intervention was explained to the patient in detail. The indication, alternatives, risks and benefits were reviewed.  Complications include but not limited to bleeding, infection, vascular injury, stroke, myocardial infection, arrhythmia, kidney injury requiring short-term or long-term hemodialysis, temporary or permanent pacemaker, radiation-related injury in the case of prolonged fluoroscopy use, emergency cardiac surgery, and death. The patient understands the risks of serious complication is 1-2 in [id number] with diagnostic cardiac cath and 1-2% or less with angioplasty/stenting.  The patient voices understanding and provides verbal feedback and wishes to proceed with right and left heart catheterization with possible PCI.  Check fasting lipid profile, hemoglobin A1c, TSH.  With regards to soft blood pressures recommend evaluating for sepsis checking LFTs, lactic acid, panculture.  If blood pressure can persist consider ICU consultation for further evaluation and management.  We will follow the patient with you.   CRITICAL CARE Performed by: Rex Kras   Total critical care time: 62 minutes   Critical care time was exclusive of separately billable procedures and treating other patients.   Critical care was necessary to treat or prevent imminent or life-threatening deterioration.   Critical care was time spent personally by me on the following activities: development of treatment plan with patient and/or surrogate as well as nursing, discussions with consultants (primary attending and interventional cardiologist), urgent Echo, evaluation of patient's response to  treatment, examination of patient, obtaining history from patient or surrogate, ordering and performing treatments and interventions, ordering and review of laboratory studies, ordering and review of radiographic studies, pulse oximetry and re-evaluation of patient's condition.  Patient's questions and concerns were addressed to her satisfaction. She voices understanding of the instructions provided during this encounter.   This note was created using a voice recognition software as a result there may be grammatical errors inadvertently enclosed that do not reflect the nature of this encounter. Every attempt is made to correct such errors.  Mechele Claude [facility]  Pager: [phone] Office: [phone] 02/01/2021, 6:57 PM

## 2021-02-01 NOTE — ED Triage Notes (Signed)
Pt arrives POV from home w/ symptoms of general malaise, SHOB exertionally, nausea, vomiting, chest pain with coughing for about 3 weeks. Patient presents today because she "is tired of feeling this way". Pt NAD at this time. VSS.

## 2021-02-01 NOTE — H&P (View-Only) (Signed)
CARDIOLOGY CONSULT NOTE  Patient ID: Mallory Butler MRN: [id number] DOB/AGE: 08/01/1950 71 y.o.  Admit date: 02/01/2021 Attending physician: Lequita Halt, MD Primary Physician:  Janie Morning, DO Outpatient Cardiologist: NA Inpatient Cardiologist: Rex Kras, DO, [facility]  Reason of consultation: NSTEMI Referring physician: Carlisle Cater, PA-C  Chief complaint: NSTEMI  HPI:  Mallory Butler is a 71 y.o. Caucasian female who presents with a chief complaint of "cough, chest pain." Her past medical history and cardiovascular risk factors include: IDDM Type II, HTN, HLD, postmenopausal female.  Recently her son was involved in a motor vehicle accident therefore patient was coming back and forth account for the last 5 weeks.  Approximately 3 weeks ago started developing upper respiratory tract symptoms and started using Mucinex, flu medications, cough drops.  Patient states that a week ago she did check a home COVID test which was negative.  However due to progressive nonproductive cough, shortness of breath with exertion and chest pain she presents to the hospital for for further evaluation and management.  Given her symptoms of precordial discomfort and elevated troponins cardiology is consulted during his hospitalization.  Chest pain: Started approximately 2 weeks ago, progressively worse, now brought on by effort related activities such as going to the bathroom and back, improves with rest, pressure-like sensation, intensity 5 out of 10, lasting for approximately 2 minutes per patient.  Last episode was yesterday night.  Currently denies chest pain.  Notable labs on admission: Sodium 134 (pseudohyponatremia), glucose 554, serum creatinine 1.12 mg/dL, albumin 2.8, BNP 647, high sensitive troponins (973 followed by 1058), hemoglobin 11 g/dL.  COVID test positive.  CT PE protocol: Negative for pulmonary embolism, bilateral pleural effusion with pulmonary edema, coronary artery calcification,  aortic atherosclerosis, enlarged mediastinal adenopathy likely reactive or inflammatory.  ALLERGIES: No Known Allergies  PAST MEDICAL HISTORY: Insulin-dependent diabetes mellitus type 2. Hypertension. Hyperlipidemia  PAST SURGICAL HISTORY: Knee scope 1991 Partial hysterectomy   FAMILY HISTORY: No family history of premature coronary artery disease or sudden cardiac death   SOCIAL HISTORY:  Denies alcohol use, cigarette smoking, or recreational drug use.  MEDICATIONS: Current Outpatient Medications  Medication Instructions   aspirin EC 81 MG tablet 1 tablet, Oral, Daily   bisoprolol (ZEBETA) 10 mg, Oral, Daily   glipiZIDE (GLUCOTROL) 10 mg, Oral, 2 times daily before meals   glucose blood (ONETOUCH ULTRA) test strip USE TO TEST 3 TIMES DAILY   insulin aspart protamine - aspart (NOVOLOG MIX 70/30 FLEXPEN) (70-30) 100 UNIT/ML FlexPen 35 Units, 2 times daily with meals   levothyroxine (SYNTHROID) 88 mcg, Oral, Daily before breakfast   lisinopril (ZESTRIL) 20 mg, Oral, Daily   metFORMIN (GLUCOPHAGE) 1000 MG tablet 1 tablet, Oral, 2 times daily   mirabegron ER (MYRBETRIQ) 50 mg, Oral, Daily   oxybutynin (DITROPAN) 5 mg, Oral, 3 times daily, TAKE 1 TABLET BY MOUTH TWICE A DAY FOR BLADDER   simvastatin (ZOCOR) 80 MG tablet 1 tablet, Oral, Daily   venlafaxine XR (EFFEXOR-XR) 150 mg, Daily with breakfast    REVIEW OF SYSTEMS: Review of Systems  Constitutional: Positive for malaise/fatigue. Negative for chills and fever.  HENT:  Negative for hoarse voice and nosebleeds.   Eyes:  Negative for discharge, double vision and pain.  Cardiovascular:  Positive for chest pain and dyspnea on exertion. Negative for claudication, leg swelling, near-syncope, orthopnea, palpitations, paroxysmal nocturnal dyspnea and syncope.  Respiratory:  Positive for shortness of breath. Negative for hemoptysis.   Musculoskeletal:  Negative for muscle cramps and  myalgias.  Gastrointestinal:  Negative for  abdominal pain, constipation, diarrhea, hematemesis, hematochezia, melena, nausea and vomiting.  Neurological:  Negative for dizziness and light-headedness.  All other systems reviewed and are negative.  PHYSICAL EXAM: Vitals with BMI 02/01/2021 02/01/2021 02/01/2021  Height - - -  Weight - - -  BMI - - -  Systolic [id number]  Diastolic 54 63 64  Pulse 72 71 70    Intake/Output Summary (Last 24 hours) at 02/01/2021 1857 Last data filed at 02/01/2021 1704 Gross per 24 hour  Intake 248.06 ml  Output --  Net 248.06 ml    Net IO Since Admission: 248.06 mL [02/01/21 1857]  CONSTITUTIONAL: Appears older than stated age, obese, no acute distress.  SKIN: Skin is warm and dry. No rash noted. No cyanosis. No pallor. No jaundice HEAD: Normocephalic and atraumatic.  EYES: No scleral icterus MOUTH/THROAT: Dry oral membranes.  NECK: JVP difficult to evaluate due to short neck stature and adipose tissue.  No thyromegaly noted. No carotid bruits  LYMPHATIC: No visible cervical adenopathy.  CHEST Normal respiratory effort. No intercostal retractions  LUNGS: Clear to auscultation upper lung fields with decreased breath sounds at the bases.  No stridor. No wheezes.  Bilateral rales.  CARDIOVASCULAR: Regular rate and rhythm, positive S1-S2, no murmurs rubs or gallops appreciated ABDOMINAL: Obese, soft, nontender, nondistended, positive bowel sounds in all 4 quadrants, no apparent ascites.  EXTREMITIES: Warm to touch, +2 ankle and feet pitting edema, palpable DP and PT pulses bilaterally.  HEMATOLOGIC: No significant bruising NEUROLOGIC: Oriented to person, place, and time. Nonfocal. Normal muscle tone.  PSYCHIATRIC: Normal mood and affect. Normal behavior. Cooperative  RADIOLOGY: DG Chest 2 View  Result Date: 02/01/2021 CLINICAL DATA:  Cough, shortness of breath. EXAM: CHEST - 2 VIEW COMPARISON:  None. FINDINGS: The heart size and mediastinal contours are within normal limits. Possible minimal  bibasilar subsegmental atelectasis or edema is noted with probable small pleural effusions. The visualized skeletal structures are unremarkable. IMPRESSION: Possible minimal bibasilar subsegmental atelectasis or edema with probable small pleural effusions. Electronically Signed   By: Marijo Conception M.D.   On: 02/01/2021 14:25   CT Angio Chest PE W and/or Wo Contrast  Result Date: 02/01/2021 CLINICAL DATA:  Positive D-dimer level. EXAM: CT ANGIOGRAPHY CHEST WITH CONTRAST TECHNIQUE: Multidetector CT imaging of the chest was performed using the standard protocol during bolus administration of intravenous contrast. Multiplanar CT image reconstructions and MIPs were obtained to evaluate the vascular anatomy. RADIATION DOSE REDUCTION: This exam was performed according to the departmental dose-optimization program which includes automated exposure control, adjustment of the mA and/or kV according to patient size and/or use of iterative reconstruction technique. CONTRAST:  27mL OMNIPAQUE IOHEXOL 350 MG/ML SOLN COMPARISON:  None. FINDINGS: Cardiovascular: Satisfactory opacification of the pulmonary arteries to the segmental level. No evidence of pulmonary embolism. Normal heart size. No pericardial effusion. Coronary artery calcifications are noted. Mediastinum/Nodes: Thyroid gland and esophagus are unremarkable. Mildly enlarged mediastinal lymph nodes are noted, largest being subcarinal lymph node measuring 13 mm, which most likely are reactive or inflammatory in etiology. Lungs/Pleura: No pneumothorax is noted. Mild to moderate bilateral pleural effusions are noted. Probable bilateral pulmonary edema is noted. Upper Abdomen: No acute abnormality. Musculoskeletal: No chest wall abnormality. No acute or significant osseous findings. Review of the MIP images confirms the above findings. IMPRESSION: No definite evidence of pulmonary embolus. Bilateral pulmonary edema is noted with mild to moderate bilateral pleural  effusions. Coronary artery calcifications are noted  suggesting coronary artery disease. Mildly enlarged mediastinal adenopathy is noted which most likely is reactive or inflammatory in etiology. Aortic Atherosclerosis (ICD10-I70.0). Electronically Signed   By: Marijo Conception M.D.   On: 02/01/2021 15:53   ECHOCARDIOGRAM LIMITED  Result Date: 02/01/2021    ECHOCARDIOGRAM REPORT   Patient Name:   JERICAH STEINHILBER Date of Exam: 02/01/2021 Medical Rec #:  [id number]     Height:       63.0 in Accession #:    [id number]    Weight:       230.0 lb Date of Birth:  May 13, 1950     BSA:          2.052 m Patient Age:    46 years      BP:           88/63 mmHg Patient Gender: F             HR:           76 bpm. Exam Location:  Inpatient Procedure: 2D Echo, Limited Color Doppler and Cardiac Doppler Indications:    NSTEMI  History:        Patient has no prior history of Echocardiogram examinations.                 Covid; Risk Factors:Diabetes, Hypertension and Sleep Apnea.  Sonographer:    Johny Chess RDCS Referring Phys: [id number]  Sonographer Comments: Patient is morbidly obese. Could not remain still. IMPRESSIONS  1. Left ventricular ejection fraction, by estimation, is 30 to 35%. The left ventricle has moderately decreased function. The left ventricle demonstrates regional wall motion abnormalities (see scoring diagram/findings for description). There is mild left ventricular hypertrophy. Left ventricular diastolic parameters are consistent with Grade I diastolic dysfunction (impaired relaxation). Elevated left ventricular end-diastolic pressure. The mid and distal anterior wall, entire anterior septum, entire apex, mid and distal inferior wall, and mid inferoseptal segment are hypokinetic.  2. Right ventricular systolic function is normal. The right ventricular size is normal. There is normal pulmonary artery systolic pressure. The estimated right ventricular systolic pressure is 99991111 mmHg.  3. The mitral valve  is degenerative. Mild mitral valve regurgitation. No evidence of mitral stenosis.  4. The aortic valve was not well visualized. Aortic valve regurgitation is not visualized. No aortic stenosis is present.  5. The inferior vena cava is dilated in size with <50% respiratory variability, suggesting right atrial pressure of 15 mmHg. Conclusion(s)/Recommendation(s): No left ventricular mural or apical thrombus/thrombi. FINDINGS  Left Ventricle: The mid and distal anterior wall, entire anterior septum, entire apex, mid and distal inferior wall, and mid inferoseptal segment are hypokinetic. Left ventricular ejection fraction, by estimation, is 30 to 35%. The left ventricle has moderately decreased function. The left ventricle demonstrates regional wall motion abnormalities. Definity contrast agent was given IV to delineate the left ventricular endocardial borders. The left ventricular internal cavity size was normal in size. There is mild left ventricular hypertrophy. Left ventricular diastolic parameters are consistent with Grade I diastolic dysfunction (impaired relaxation). Elevated left ventricular end-diastolic pressure.  LV Wall Scoring: The mid and distal anterior wall, entire anterior septum, entire apex, mid and distal inferior wall, and mid inferoseptal segment are hypokinetic. Right Ventricle: The right ventricular size is normal. No increase in right ventricular wall thickness. Right ventricular systolic function is normal. There is normal pulmonary artery systolic pressure. The tricuspid regurgitant velocity is 2.13 m/s, and  with an assumed right atrial pressure of 8 mmHg,  the estimated right ventricular systolic pressure is 99991111 mmHg. Left Atrium: Left atrial size was normal in size. Right Atrium: Right atrial size was normal in size. Pericardium: There is no evidence of pericardial effusion. Mitral Valve: The mitral valve is degenerative in appearance. There is mild thickening of the mitral valve  leaflet(s). There is mild calcification of the mitral valve leaflet(s). Mildly decreased mobility of the mitral valve leaflets. Mild mitral valve regurgitation. No evidence of mitral valve stenosis. Tricuspid Valve: The tricuspid valve is normal in structure. Tricuspid valve regurgitation is mild . No evidence of tricuspid stenosis. Aortic Valve: The aortic valve was not well visualized. Aortic valve regurgitation is not visualized. No aortic stenosis is present. Pulmonic Valve: The pulmonic valve was not well visualized. Pulmonic valve regurgitation is not visualized. No evidence of pulmonic stenosis. Aorta: The aortic root and ascending aorta are structurally normal, with no evidence of dilitation. Venous: The inferior vena cava is dilated in size with less than 50% respiratory variability, suggesting right atrial pressure of 15 mmHg. IAS/Shunts: No atrial level shunt detected by color flow Doppler.  LEFT VENTRICLE PLAX 2D LVIDd:         5.70 cm      Diastology LVIDs:         4.00 cm      LV e' medial:    5.11 cm/s LV PW:         1.50 cm      LV E/e' medial:  21.3 LV IVS:        1.10 cm      LV e' lateral:   5.98 cm/s LVOT diam:     1.90 cm      LV E/e' lateral: 18.2 LVOT Area:     2.84 cm  LV Volumes (MOD) LV vol d, MOD A2C: 150.0 ml LV vol d, MOD A4C: 129.0 ml LV vol s, MOD A2C: 118.0 ml LV vol s, MOD A4C: 96.7 ml LV SV MOD A2C:     32.0 ml LV SV MOD A4C:     129.0 ml LV SV MOD BP:      32.5 ml IVC IVC diam: 2.60 cm LEFT ATRIUM         Index       RIGHT ATRIUM           Index LA diam:    5.20 cm 2.53 cm/m  RA Area:     14.20 cm                                 RA Volume:   35.50 ml  17.30 ml/m   AORTA Ao Asc diam: 3.10 cm MITRAL VALVE                TRICUSPID VALVE MV Area (PHT): 3.85 cm     TR Peak grad:   18.1 mmHg MV Decel Time: 197 msec     TR Vmax:        213.00 cm/s MV E velocity: 109.00 cm/s MV A velocity: 69.00 cm/s   SHUNTS MV E/A ratio:  1.58         Systemic Diam: 1.90 cm Mathan Darroch DO  Electronically signed by Rex Kras DO Signature Date/Time: 02/01/2021/6:22:49 PM    Final     LABORATORY DATA: Lab Results  Component Value Date   WBC 8.1 02/01/2021   HGB 11.0 (L) 02/01/2021   HCT 34.9 (L)  02/01/2021   MCV 99.7 02/01/2021   PLT 269 02/01/2021    Recent Labs  Lab 02/01/21 1205  NA 134*  K 4.1  CL 102  CO2 19*  BUN 19  CREATININE 1.12*  CALCIUM 8.4*  PROT 6.7  BILITOT 0.4  ALKPHOS 91  ALT 20  AST 24  GLUCOSE 554*    Lipid Panel  No results found for: CHOL, HDL, LDLCALC, LDLDIRECT, TRIG, CHOLHDL  BNP (last 3 results) Recent Labs    02/01/21 1205  BNP 647.1*    HEMOGLOBIN A1C No results found for: HGBA1C, MPG  Cardiac Panel (last 3 results) Recent Labs    02/01/21 1205 02/01/21 1448  TROPONINIHS 973* 1,058*     TSH No results for input(s): TSH in the last 8760 hours.   CARDIAC DATABASE: EKG: 02/01/2021: NSR, 72 bpm, LVH per voltage criteria,PRWP, subtle ST-T changes.  02/01/2021: NSR, 68 bpm, LVH per voltage criteria, poor R wave progression, suble ST elevation V3-V6 without reciprocal changes.  02/01/2021: NSR, 72 bpm, LVH voltage criteria, PR WP, suble ST elevation V3-V6 without reciprocal changes, similar to prior EKG.  Echocardiogram:  1. Left ventricular ejection fraction, by estimation, is 30 to 35%. The  left ventricle has moderately decreased function. The left ventricle  demonstrates regional wall motion abnormalities (see scoring  diagram/findings for description). There is mild  left ventricular hypertrophy. Left ventricular diastolic parameters are  consistent with Grade I diastolic dysfunction (impaired relaxation).  Elevated left ventricular end-diastolic pressure. The mid and distal  anterior wall, entire anterior septum,  entire apex, mid and distal inferior wall, and mid inferoseptal segment  are hypokinetic.   2. Right ventricular systolic function is normal. The right ventricular  size is normal. There is normal  pulmonary artery systolic pressure. The  estimated right ventricular systolic pressure is 99991111 mmHg.   3. The mitral valve is degenerative. Mild mitral valve regurgitation. No  evidence of mitral stenosis.   4. The aortic valve was not well visualized. Aortic valve regurgitation  is not visualized. No aortic stenosis is present.   5. The inferior vena cava is dilated in size with <50% respiratory  variability, suggesting right atrial pressure of 15 mmHg.   Conclusion(s)/Recommendation(s): No left ventricular mural or apical  thrombus/thrombi.    IMPRESSION & RECOMMENDATIONS: ONOLEE LANDECK is a 71 y.o. Caucasian female whose past medical history and cardiovascular risk factors include: IDDM Type II, HTN, HLD, postmenopausal female.  Impression:  Non-STEMI COVID-19 infection Hyperglycemia, insulin-dependent diabetes mellitus type 2 Elevated troponin secondary to non-STEMI Elevated BNP Coronary artery calcification -nongated CT study Aortic atherosclerosis Hypertension with chronic kidney disease stage III Dependent diabetes with complications of CKD, hyperlipidemia, hyperglycemia Obesity due to excess calories  Plan:  Cardiology consulted for NSTEMI evaluation.  Currently does not have any active chest pain.  Troponins have elevated and climbing.  EKG showed sinus rhythm with subtle ST elevations in V3-V6 without reciprocal changes.  Given her presentation, elevated troponins, EKG, and soft blood pressures spoke to interventional cardiologist Dr. Einar Gip on-call who recommends no emergent interventions as she is symptom-free.  Patient is advised to report any anginal discomfort she experiences during her current hospitalization.  Echocardiogram notes moderately reduced LVEF, with regional wall motion abnormalities.  Given the regional wall motion abnormalities the differential diagnosis also includes Takotsubo cardiomyopathy in the setting of recent family stressors and COVID-19.   However given her risk factors of insulin-dependent diabetes, coronary calcification, and other comorbid conditions obstructive CAD cannot be  ruled out.  Had a long discussion with the patient with regards to proceeding with invasive right and left catheterization.   The procedure of left and right heart catheterization with possible intervention was explained to the patient in detail. The indication, alternatives, risks and benefits were reviewed.  Complications include but not limited to bleeding, infection, vascular injury, stroke, myocardial infection, arrhythmia, kidney injury requiring short-term or long-term hemodialysis, temporary or permanent pacemaker, radiation-related injury in the case of prolonged fluoroscopy use, emergency cardiac surgery, and death. The patient understands the risks of serious complication is 1-2 in [id number] with diagnostic cardiac cath and 1-2% or less with angioplasty/stenting.  The patient voices understanding and provides verbal feedback and wishes to proceed with right and left heart catheterization with possible PCI.  Check fasting lipid profile, hemoglobin A1c, TSH.  With regards to soft blood pressures recommend evaluating for sepsis checking LFTs, lactic acid, panculture.  If blood pressure can persist consider ICU consultation for further evaluation and management.  We will follow the patient with you.   CRITICAL CARE Performed by: Rex Kras   Total critical care time: 62 minutes   Critical care time was exclusive of separately billable procedures and treating other patients.   Critical care was necessary to treat or prevent imminent or life-threatening deterioration.   Critical care was time spent personally by me on the following activities: development of treatment plan with patient and/or surrogate as well as nursing, discussions with consultants (primary attending and interventional cardiologist), urgent Echo, evaluation of patient's response to  treatment, examination of patient, obtaining history from patient or surrogate, ordering and performing treatments and interventions, ordering and review of laboratory studies, ordering and review of radiographic studies, pulse oximetry and re-evaluation of patient's condition.  Patient's questions and concerns were addressed to her satisfaction. She voices understanding of the instructions provided during this encounter.   This note was created using a voice recognition software as a result there may be grammatical errors inadvertently enclosed that do not reflect the nature of this encounter. Every attempt is made to correct such errors.  Mechele [facility]  Pager: [phone] Office: [phone] 02/01/2021, 6:57 PM

## 2021-02-01 NOTE — ED Notes (Signed)
Notified Josh, PA of troponin of 973 and Glucose of 554.

## 2021-02-01 NOTE — ED Notes (Signed)
Pt ambulated to RR and back to room. Pt was a little SOB but did well walking to and back

## 2021-02-01 NOTE — ED Notes (Signed)
Pt ambulated to the RR earlier I did not get UA I will next time she goes

## 2021-02-02 ENCOUNTER — Other Ambulatory Visit (HOSPITAL_COMMUNITY): Payer: Self-pay

## 2021-02-02 ENCOUNTER — Encounter (HOSPITAL_COMMUNITY)
Admission: EM | Disposition: A | Discharge status: Home Health | Payer: Self-pay | Source: Home / Self Care | Attending: Internal Medicine

## 2021-02-02 ENCOUNTER — Encounter (HOSPITAL_COMMUNITY): Payer: Self-pay | Admitting: Cardiology

## 2021-02-02 DIAGNOSIS — E119 Type 2 diabetes mellitus without complications: Secondary | ICD-10-CM | POA: Diagnosis present

## 2021-02-02 DIAGNOSIS — N179 Acute kidney failure, unspecified: Secondary | ICD-10-CM | POA: Diagnosis present

## 2021-02-02 DIAGNOSIS — E1169 Type 2 diabetes mellitus with other specified complication: Secondary | ICD-10-CM

## 2021-02-02 DIAGNOSIS — E039 Hypothyroidism, unspecified: Secondary | ICD-10-CM

## 2021-02-02 DIAGNOSIS — I5021 Acute systolic (congestive) heart failure: Secondary | ICD-10-CM

## 2021-02-02 DIAGNOSIS — I25110 Atherosclerotic heart disease of native coronary artery with unstable angina pectoris: Secondary | ICD-10-CM | POA: Diagnosis present

## 2021-02-02 DIAGNOSIS — U071 COVID-19: Secondary | ICD-10-CM

## 2021-02-02 DIAGNOSIS — I2510 Atherosclerotic heart disease of native coronary artery without angina pectoris: Secondary | ICD-10-CM | POA: Insufficient documentation

## 2021-02-02 DIAGNOSIS — I10 Essential (primary) hypertension: Secondary | ICD-10-CM

## 2021-02-02 DIAGNOSIS — E785 Hyperlipidemia, unspecified: Secondary | ICD-10-CM

## 2021-02-02 HISTORY — PX: CORONARY STENT INTERVENTION: CATH118234

## 2021-02-02 HISTORY — PX: RIGHT/LEFT HEART CATH AND CORONARY ANGIOGRAPHY: CATH118266

## 2021-02-02 LAB — GLUCOSE, CAPILLARY
Glucose-Capillary: 415 mg/dL — ABNORMAL HIGH (ref 70–99)
Glucose-Capillary: 334 mg/dL — ABNORMAL HIGH (ref 70–99)
Glucose-Capillary: 278 mg/dL — ABNORMAL HIGH (ref 70–99)
Glucose-Capillary: 307 mg/dL — ABNORMAL HIGH (ref 70–99)

## 2021-02-02 LAB — POCT I-STAT EG7
Acid-base deficit: 5 mmol/L — ABNORMAL HIGH (ref 0.0–2.0)
Bicarbonate: 21.8 mmol/L (ref 20.0–28.0)
Calcium, Ion: 1.24 mmol/L (ref 1.15–1.40)
HCT: 30 % — ABNORMAL LOW (ref 36.0–46.0)
Hemoglobin: 10.2 g/dL — ABNORMAL LOW (ref 12.0–15.0)
O2 Saturation: 53 %
Potassium: 4.4 mmol/L (ref 3.5–5.1)
Sodium: 138 mmol/L (ref 135–145)
TCO2: 23 mmol/L (ref 22–32)
pCO2, Ven: 45.2 mmHg (ref 44.0–60.0)
pH, Ven: 7.292 (ref 7.250–7.430)
pO2, Ven: 31 mmHg — CL (ref 32.0–45.0)

## 2021-02-02 LAB — CBC WITH DIFFERENTIAL/PLATELET
Abs Immature Granulocytes: 0.06 10*3/uL (ref 0.00–0.07)
Basophils Absolute: 0 10*3/uL (ref 0.0–0.1)
Basophils Relative: 0 %
Eosinophils Absolute: 0 10*3/uL (ref 0.0–0.5)
Eosinophils Relative: 0 %
HCT: 32.7 % — ABNORMAL LOW (ref 36.0–46.0)
Hemoglobin: 10.5 g/dL — ABNORMAL LOW (ref 12.0–15.0)
Immature Granulocytes: 1 %
Lymphocytes Relative: 26 %
Lymphs Abs: 2.6 10*3/uL (ref 0.7–4.0)
MCH: 31.3 pg (ref 26.0–34.0)
MCHC: 32.1 g/dL (ref 30.0–36.0)
MCV: 97.6 fL (ref 80.0–100.0)
Monocytes Absolute: 0.5 10*3/uL (ref 0.1–1.0)
Monocytes Relative: 5 %
Neutro Abs: 6.9 10*3/uL (ref 1.7–7.7)
Neutrophils Relative %: 68 %
Platelets: 268 10*3/uL (ref 150–400)
RBC: 3.35 MIL/uL — ABNORMAL LOW (ref 3.87–5.11)
RDW: 13.9 % (ref 11.5–15.5)
WBC: 10.1 10*3/uL (ref 4.0–10.5)
nRBC: 0 % (ref 0.0–0.2)

## 2021-02-02 LAB — TROPONIN I (HIGH SENSITIVITY)
Troponin I (High Sensitivity): 1302 ng/L (ref <18)
Troponin I (High Sensitivity): 1331 ng/L (ref <18)
Troponin I (High Sensitivity): 1347 ng/L (ref <18)

## 2021-02-02 LAB — COMPREHENSIVE METABOLIC PANEL
ALT: 28 U/L (ref 0–44)
AST: 47 U/L — ABNORMAL HIGH (ref 15–41)
Albumin: 3.2 g/dL — ABNORMAL LOW (ref 3.5–5.0)
Alkaline Phosphatase: 141 U/L — ABNORMAL HIGH (ref 38–126)
Anion gap: 14 (ref 5–15)
BUN: 25 mg/dL — ABNORMAL HIGH (ref 8–23)
CO2: 20 mmol/L — ABNORMAL LOW (ref 22–32)
Calcium: 8.7 mg/dL — ABNORMAL LOW (ref 8.9–10.3)
Chloride: 101 mmol/L (ref 98–111)
Creatinine, Ser: 1.22 mg/dL — ABNORMAL HIGH (ref 0.44–1.00)
GFR, Estimated: 48 mL/min — ABNORMAL LOW (ref >60)
Glucose, Bld: 439 mg/dL — ABNORMAL HIGH (ref 70–99)
Potassium: 4.9 mmol/L (ref 3.5–5.1)
Sodium: 135 mmol/L (ref 135–145)
Total Bilirubin: 0.6 mg/dL (ref 0.3–1.2)
Total Protein: 7.2 g/dL (ref 6.5–8.1)

## 2021-02-02 LAB — POCT I-STAT 7, (LYTES, BLD GAS, ICA,H+H)
Acid-base deficit: 6 mmol/L — ABNORMAL HIGH (ref 0.0–2.0)
Bicarbonate: 19.9 mmol/L — ABNORMAL LOW (ref 20.0–28.0)
Calcium, Ion: 1.24 mmol/L (ref 1.15–1.40)
HCT: 30 % — ABNORMAL LOW (ref 36.0–46.0)
Hemoglobin: 10.2 g/dL — ABNORMAL LOW (ref 12.0–15.0)
O2 Saturation: 99 %
Potassium: 4.3 mmol/L (ref 3.5–5.1)
Sodium: 137 mmol/L (ref 135–145)
TCO2: 21 mmol/L — ABNORMAL LOW (ref 22–32)
pCO2 arterial: 39.5 mmHg (ref 32.0–48.0)
pH, Arterial: 7.31 — ABNORMAL LOW (ref 7.350–7.450)
pO2, Arterial: 128 mmHg — ABNORMAL HIGH (ref 83.0–108.0)

## 2021-02-02 LAB — LIPID PANEL
Cholesterol: 140 mg/dL (ref 0–200)
HDL: 37 mg/dL — ABNORMAL LOW (ref >40)
LDL Cholesterol: 79 mg/dL (ref 0–99)
Total CHOL/HDL Ratio: 3.8 RATIO
Triglycerides: 120 mg/dL (ref <150)
VLDL: 24 mg/dL (ref 0–40)

## 2021-02-02 LAB — POCT ACTIVATED CLOTTING TIME
Activated Clotting Time: 221 seconds
Activated Clotting Time: 239 seconds

## 2021-02-02 LAB — D-DIMER, QUANTITATIVE: D-Dimer, Quant: 0.76 ug/mL-FEU — ABNORMAL HIGH (ref 0.00–0.50)

## 2021-02-02 LAB — FERRITIN: Ferritin: 1457 ng/mL — ABNORMAL HIGH (ref 11–307)

## 2021-02-02 LAB — HEPARIN LEVEL (UNFRACTIONATED): Heparin Unfractionated: <0.1 IU/mL — ABNORMAL LOW (ref 0.30–0.70)

## 2021-02-02 LAB — MAGNESIUM: Magnesium: 2 mg/dL (ref 1.7–2.4)

## 2021-02-02 LAB — PHOSPHORUS: Phosphorus: 4.6 mg/dL (ref 2.5–4.6)

## 2021-02-02 LAB — C-REACTIVE PROTEIN: CRP: 6.2 mg/dL — ABNORMAL HIGH (ref <1.0)

## 2021-02-02 SURGERY — RIGHT/LEFT HEART CATH AND CORONARY ANGIOGRAPHY
Anesthesia: LOCAL

## 2021-02-02 MED ORDER — SODIUM CHLORIDE 0.9 % IV SOLN: 250.0000 mL | INTRAVENOUS | Status: DC | PRN

## 2021-02-02 MED ORDER — PANTOPRAZOLE SODIUM 40 MG PO TBEC
40.0000 mg | DELAYED_RELEASE_TABLET | Freq: Every day | ORAL | Status: DC
  Administered 2021-02-02 – 2021-02-06 (×5): 40 mg via ORAL
  Filled 2021-02-02 (×5): qty 1

## 2021-02-02 MED ORDER — HEPARIN SODIUM (PORCINE) 5000 UNIT/ML IJ SOLN
5000.0000 [IU] | Freq: Three times a day (TID) | INTRAMUSCULAR | Status: DC
  Administered 2021-02-02 – 2021-02-06 (×11): 5000 [IU] via SUBCUTANEOUS
  Filled 2021-02-02 (×11): qty 1

## 2021-02-02 MED ORDER — NITROGLYCERIN 1 MG/10 ML FOR IR/CATH LAB
INTRA_ARTERIAL | Status: AC
  Filled 2021-02-02: qty 10

## 2021-02-02 MED ORDER — LIDOCAINE HCL (PF) 1 % IJ SOLN
INTRAMUSCULAR | Status: AC
  Filled 2021-02-02: qty 30

## 2021-02-02 MED ORDER — SODIUM CHLORIDE 0.9 % IV SOLN: INTRAVENOUS | Status: DC

## 2021-02-02 MED ORDER — SODIUM CHLORIDE 0.9% FLUSH: 3.0000 mL | INTRAVENOUS | Status: DC | PRN

## 2021-02-02 MED ORDER — TICAGRELOR 90 MG PO TABS
ORAL_TABLET | ORAL | Status: AC
  Filled 2021-02-02: qty 2

## 2021-02-02 MED ORDER — INSULIN ASPART 100 UNIT/ML IJ SOLN
0.0000 [IU] | Freq: Three times a day (TID) | INTRAMUSCULAR | Status: DC
  Administered 2021-02-02: 11 [IU] via SUBCUTANEOUS
  Administered 2021-02-03: 8 [IU] via SUBCUTANEOUS
  Administered 2021-02-03 (×2): 11 [IU] via SUBCUTANEOUS
  Administered 2021-02-03: 15 [IU] via SUBCUTANEOUS
  Administered 2021-02-04: 3 [IU] via SUBCUTANEOUS
  Administered 2021-02-04: 5 [IU] via SUBCUTANEOUS
  Administered 2021-02-04 (×2): 15 [IU] via SUBCUTANEOUS
  Administered 2021-02-05: 3 [IU] via SUBCUTANEOUS
  Administered 2021-02-05: 5 [IU] via SUBCUTANEOUS
  Administered 2021-02-05 – 2021-02-06 (×2): 3 [IU] via SUBCUTANEOUS
  Administered 2021-02-06: 5 [IU] via SUBCUTANEOUS

## 2021-02-02 MED ORDER — TRAZODONE HCL 50 MG PO TABS
50.0000 mg | ORAL_TABLET | Freq: Every evening | ORAL | Status: DC | PRN
  Administered 2021-02-02 – 2021-02-05 (×2): 50 mg via ORAL
  Filled 2021-02-02 (×2): qty 1

## 2021-02-02 MED ORDER — HEPARIN (PORCINE) IN NACL 1000-0.9 UT/500ML-% IV SOLN
INTRAVENOUS | Status: AC
  Filled 2021-02-02: qty 1000

## 2021-02-02 MED ORDER — IOHEXOL 350 MG/ML SOLN
INTRAVENOUS | Status: DC | PRN
  Administered 2021-02-02: 110 mL

## 2021-02-02 MED ORDER — HEPARIN (PORCINE) IN NACL 1000-0.9 UT/500ML-% IV SOLN
INTRAVENOUS | Status: DC | PRN
  Administered 2021-02-02 (×2): 500 mL

## 2021-02-02 MED ORDER — VERAPAMIL HCL 2.5 MG/ML IV SOLN
INTRA_ARTERIAL | Status: DC | PRN
  Administered 2021-02-02: 10 mL via INTRA_ARTERIAL

## 2021-02-02 MED ORDER — HEPARIN SODIUM (PORCINE) 1000 UNIT/ML IJ SOLN
INTRAMUSCULAR | Status: AC
  Filled 2021-02-02: qty 10

## 2021-02-02 MED ORDER — FUROSEMIDE 10 MG/ML IJ SOLN
40.0000 mg | Freq: Two times a day (BID) | INTRAMUSCULAR | Status: DC
  Administered 2021-02-02 – 2021-02-03 (×2): 40 mg via INTRAVENOUS
  Filled 2021-02-02 (×2): qty 4

## 2021-02-02 MED ORDER — ASPIRIN 81 MG PO CHEW
81.0000 mg | CHEWABLE_TABLET | ORAL | Status: AC
  Administered 2021-02-02: 81 mg via ORAL
  Filled 2021-02-02: qty 1

## 2021-02-02 MED ORDER — MIDAZOLAM HCL 2 MG/2ML IJ SOLN
INTRAMUSCULAR | Status: DC | PRN
  Administered 2021-02-02: 1 mg via INTRAVENOUS

## 2021-02-02 MED ORDER — SODIUM CHLORIDE 0.9 % IV SOLN
250.0000 mL | INTRAVENOUS | Status: DC | PRN
Start: 2021-02-02 — End: 2021-02-06

## 2021-02-02 MED ORDER — MORPHINE SULFATE (PF) 2 MG/ML IV SOLN
1.0000 mg | INTRAVENOUS | Status: DC | PRN
  Administered 2021-02-02: 1 mg via INTRAVENOUS
  Filled 2021-02-02: qty 1

## 2021-02-02 MED ORDER — NITROGLYCERIN 0.4 MG SL SUBL
SUBLINGUAL_TABLET | SUBLINGUAL | Status: AC
  Filled 2021-02-02: qty 1

## 2021-02-02 MED ORDER — FENTANYL CITRATE (PF) 100 MCG/2ML IJ SOLN
INTRAMUSCULAR | Status: DC | PRN
  Administered 2021-02-02 (×2): 25 ug via INTRAVENOUS

## 2021-02-02 MED ORDER — VERAPAMIL HCL 2.5 MG/ML IV SOLN
INTRAVENOUS | Status: AC
  Filled 2021-02-02: qty 2

## 2021-02-02 MED ORDER — SODIUM CHLORIDE 0.9% FLUSH
3.0000 mL | Freq: Two times a day (BID) | INTRAVENOUS | Status: DC
  Administered 2021-02-02 – 2021-02-06 (×7): 3 mL via INTRAVENOUS

## 2021-02-02 MED ORDER — MIDAZOLAM HCL 5 MG/5ML IJ SOLN
INTRAMUSCULAR | Status: AC
  Filled 2021-02-02: qty 5

## 2021-02-02 MED ORDER — TICAGRELOR 90 MG PO TABS
90.0000 mg | ORAL_TABLET | Freq: Two times a day (BID) | ORAL | Status: DC
  Administered 2021-02-02 – 2021-02-06 (×8): 90 mg via ORAL
  Filled 2021-02-02 (×8): qty 1

## 2021-02-02 MED ORDER — HEPARIN SODIUM (PORCINE) 1000 UNIT/ML IJ SOLN
INTRAMUSCULAR | Status: DC | PRN
  Administered 2021-02-02: 8000 [IU] via INTRAVENOUS
  Administered 2021-02-02: 2000 [IU] via INTRAVENOUS
  Administered 2021-02-02: 3000 [IU] via INTRAVENOUS

## 2021-02-02 MED ORDER — HYDRALAZINE HCL 20 MG/ML IJ SOLN: 5.0000 mg | INTRAMUSCULAR | Status: AC | PRN

## 2021-02-02 MED ORDER — LIDOCAINE HCL (PF) 1 % IJ SOLN
INTRAMUSCULAR | Status: DC | PRN
  Administered 2021-02-02: 2 mL

## 2021-02-02 MED ORDER — TICAGRELOR 90 MG PO TABS
ORAL_TABLET | ORAL | Status: DC | PRN
  Administered 2021-02-02: 180 mg via ORAL

## 2021-02-02 MED ORDER — ASPIRIN EC 81 MG PO TBEC
81.0000 mg | DELAYED_RELEASE_TABLET | Freq: Every day | ORAL | Status: DC
  Administered 2021-02-03 – 2021-02-06 (×4): 81 mg via ORAL
  Filled 2021-02-02 (×4): qty 1

## 2021-02-02 MED ORDER — LABETALOL HCL 5 MG/ML IV SOLN: 10.0000 mg | INTRAVENOUS | Status: AC | PRN

## 2021-02-02 MED ORDER — INSULIN GLARGINE-YFGN 100 UNIT/ML ~~LOC~~ SOLN
10.0000 [IU] | Freq: Every day | SUBCUTANEOUS | Status: DC
  Administered 2021-02-02: 10 [IU] via SUBCUTANEOUS
  Filled 2021-02-02 (×2): qty 0.1

## 2021-02-02 MED ORDER — HEPARIN BOLUS VIA INFUSION
2000.0000 [IU] | Freq: Once | INTRAVENOUS | Status: AC
  Administered 2021-02-02: 2000 [IU] via INTRAVENOUS
  Filled 2021-02-02: qty 2000

## 2021-02-02 MED ORDER — NITROGLYCERIN 0.4 MG SL SUBL
0.4000 mg | SUBLINGUAL_TABLET | SUBLINGUAL | Status: DC | PRN
  Administered 2021-02-02 (×4): 0.4 mg via SUBLINGUAL
  Filled 2021-02-02: qty 1

## 2021-02-02 MED ORDER — METHYLPREDNISOLONE SODIUM SUCC 40 MG IJ SOLR
40.0000 mg | Freq: Every day | INTRAMUSCULAR | Status: DC
  Administered 2021-02-03 – 2021-02-04 (×2): 40 mg via INTRAVENOUS
  Filled 2021-02-02: qty 1

## 2021-02-02 MED ORDER — FENTANYL CITRATE (PF) 100 MCG/2ML IJ SOLN
INTRAMUSCULAR | Status: AC
  Filled 2021-02-02: qty 2

## 2021-02-02 SURGICAL SUPPLY — 23 items
BALLN SAPPHIRE 2.5X12 (BALLOONS) ×2
BALLN ~~LOC~~ EUPHORA RX 3.5X8 (BALLOONS) ×2
BALLOON SAPPHIRE 2.5X12 (BALLOONS) IMPLANT
BALLOON ~~LOC~~ EUPHORA RX 3.5X8 (BALLOONS) IMPLANT
CATH BALLN WEDGE 5F 110CM (CATHETERS) ×1 IMPLANT
CATH LAUNCHER 6FR EBU3.5 (CATHETERS) ×1 IMPLANT
CATH OPTITORQUE TIG 4.0 5F (CATHETERS) ×1 IMPLANT
DEVICE RAD COMP TR BAND LRG (VASCULAR PRODUCTS) ×1 IMPLANT
ELECT DEFIB PAD ADLT CADENCE (PAD) ×1 IMPLANT
GLIDESHEATH SLEND A-KIT 6F 22G (SHEATH) ×1 IMPLANT
GUIDEWIRE INQWIRE 1.5J.035X260 (WIRE) IMPLANT
INQWIRE 1.5J .035X260CM (WIRE) ×2
KIT ENCORE 26 ADVANTAGE (KITS) ×1 IMPLANT
KIT HEART LEFT (KITS) ×2 IMPLANT
MAT PREVALON FULL STRYKER (MISCELLANEOUS) ×1 IMPLANT
PACK CARDIAC CATHETERIZATION (CUSTOM PROCEDURE TRAY) ×2 IMPLANT
SHEATH GLIDE SLENDER 4/5FR (SHEATH) ×1 IMPLANT
STENT ONYX FRONTIER 2.5X26 (Permanent Stent) ×1 IMPLANT
STENT ONYX FRONTIER 2.75X12 (Permanent Stent) ×1 IMPLANT
TRANSDUCER W/STOPCOCK (MISCELLANEOUS) ×2 IMPLANT
TUBING CIL FLEX 10 FLL-RA (TUBING) ×2 IMPLANT
WIRE COUGAR XT STRL 190CM (WIRE) ×1 IMPLANT
WIRE RUNTHROUGH .014X180CM (WIRE) ×1 IMPLANT

## 2021-02-02 NOTE — Assessment & Plan Note (Addendum)
CAD -Underwent left heart cath 1/30, stenting of proximal LAD and distal left main -Continue aspirin, ticagrelor, bisoprolol, statin -DAPT for 1 year

## 2021-02-02 NOTE — Assessment & Plan Note (Addendum)
Continue blood pressure control with bisoprolol.

## 2021-02-02 NOTE — Assessment & Plan Note (Addendum)
Echo with EF 30-35%, wall motion abnormalities -Diuresed with IV Lasix, appears close to euvolemic -Diuretics subsequently held in the setting of AKI and soft BP -Started on Farxiga, discussed with cardiology today, creatinine improving, volume status is stable, will discharge on Aldactone 12.5 mg daily and losartan 12.5 mg daily -Further titration of GDMT at follow-up

## 2021-02-02 NOTE — Progress Notes (Signed)
Pt complaining of 7/10 midsternum CP, "like when it's hard to breathe in the cold winter air". Tylenol given at 1541. Pain increased to 8/10, EKG obtained. MD Arrien notified. BP 117/84 lying , nitro given at 1629. BP 104/73 sitting at 1634, CP at 8/10, 2nd nitro given 1636. BP 104/67 at 1640, CP at 7/10, 3rd nitro given at 1641. At 1645, BP 96/67, pain remains at 7/10. Morphine ordered by MD Arrien. Will give 1 dose and continue to monitor.

## 2021-02-02 NOTE — Interval H&P Note (Signed)
History and Physical Interval Note:  02/02/2021 8:46 AM  Mallory Butler  has presented today for surgery, with the diagnosis of unstable angina.  The various methods of treatment have been discussed with the patient and family. After consideration of risks, benefits and other options for treatment, the patient has consented to  Procedure(s): RIGHT/LEFT HEART CATH AND CORONARY ANGIOGRAPHY (N/A) and possible angioplasty as a surgical intervention.  The patient's history has been reviewed, patient examined, no change in status, stable for surgery.  I have reviewed the patient's chart and labs.  Questions were answered to the patient's satisfaction.   Cath Lab Visit (complete for each Cath Lab visit)  Clinical Evaluation Leading to the Procedure:   ACS: Yes.    Non-ACS:    Anginal Classification: CCS IV  Anti-ischemic medical therapy: Minimal Therapy (1 class of medications)  Non-Invasive Test Results: No non-invasive testing performed  Prior CABG: No previous CABG   Yates Decamp

## 2021-02-02 NOTE — Progress Notes (Signed)
Heart Failure Navigator Progress Note  Assessed for Heart & Vascular TOC clinic readiness. [facility] Cardiology consulted this hospitalization, Dr. Einar Gip.   Navigator available for educational resources.   Pricilla Holm, MSN, RN Heart Failure Nurse Navigator [phone]

## 2021-02-02 NOTE — Hospital Course (Addendum)
Mallory Butler was admitted to the hospital with the working diagnosis of NSTEMI, complicated with acute heart failure decompensation in the setting of acute SARS COVID 19 infection.    71 yo female with the past medical history of T2DM, dyslipidemia, HTN and obesity class 3 who presented with dyspnea and cough. Reported 3 weeks of dry cough, with worsening dyspnea on exertion, along with orthopnea. No chest pain. Last week a home test for COVID 19 was negative. Because persistent symptoms she came to the hospital for further evaluation. On her initial physical examination his blood pressure was 93/59, HR 68, RR 19-22, home 94%-99%, positive rales bilaterally, with increase work of breathing, heart with S1 and S2 present and rhythmic, no murmurs or gallops, abdomen soft, positive lower extremity edema.   Na 134, K 4,1, CL 102. Bicarbonate 19, glucose 554, BUN 19 and cr at 1,12  High sensitive troponin 973, 1058, 1347, 1331, 1302 Lactic acid 2,3  Wbc 8,1 hgb 11,0 hct 34,9 Plt 269  SARS COVID 19 positive  Chest radiograph with cardiomegaly and bilateral hilar vascular congestion.   Ct chest with no pulmonary embolism, bilateral pleural effusion and bilateral ground glass opacities bilaterally.   EKG with 72 bpm, left axis deviation, normal Qtc, sinus rhythm, with ST elevation in lead II, III, AVF, V3-V6 with negative T wave lead I and AVL.   Patient underwent cardiac catheterization, LAD with occlusion in the mid segment after the origin of a large diagonal with had 99% stenosis.  PW 24 with CO 4,2 and CI 2,0.  Underwent mid LAD stent placement with good toleration.   Patient has been placed on diuresis with good response.   Started on antiviral therapy and systemic corticosteroids.

## 2021-02-02 NOTE — Assessment & Plan Note (Addendum)
Clinically stable, minimal hypoxemia which is multifactorial -Completed course of IV remdesivir and steroids, weaned off oxygen -Continue  isolation X 10 days total

## 2021-02-02 NOTE — Progress Notes (Addendum)
ANTICOAGULATION CONSULT NOTE  Pharmacy Consult for Heparin Indication: chest pain/ACS Brief A/P: Heparin level subtherapeutic Rebolus, Increase Heparin rate  No Known Allergies  Patient Measurements: Height: 5\' 3"  (160 cm) Weight: 107 kg (235 lb 14.4 oz) IBW/kg (Calculated) : 52.4 Heparin Dosing Weight: 77.1 kg  Vital Signs: Temp: 98 F (36.7 C) (01/30 0005) Temp Source: Oral (01/30 0005) BP: 127/80 (01/30 0005) Pulse Rate: 80 (01/30 0005)  Labs: Recent Labs    02/01/21 1205 02/01/21 1448 02/01/21 1959 02/01/21 2350  HGB 11.0*  --   --   --   HCT 34.9*  --   --   --   PLT 269  --   --   --   LABPROT 13.1  --   --   --   INR 1.0  --   --   --   HEPARINUNFRC  --   --   --  <0.10*  CREATININE 1.12*  --  1.22*  --   TROPONINIHS 973* 1,058*  --  1,347*     Estimated Creatinine Clearance: 50.3 mL/min (A) (by C-G formula based on SCr of 1.22 mg/dL (H)).  Assessment: 71 y.o. female with NSTEMI for heparin  Goal of Therapy:  Heparin level 0.3-0.7 units/ml Monitor platelets by anticoagulation protocol: Yes   Plan:  Heparin 2000 units IV bolus, then increase heparin 1200 units/hr Follow up after cath today   66, PharmD, BCPS

## 2021-02-02 NOTE — Assessment & Plan Note (Addendum)
-   Mild bump in creatinine to 1.6, 2 days ago -Creatinine improving, down to 1.4 today, will restart low-dose Aldactone and losartan, see discussion above, needs BMP check in 1 week

## 2021-02-02 NOTE — Progress Notes (Signed)
Called and spoke to her nurse Bernadene Person to make the ordered labs from 02/01/2021 are collected and to keep a close monitoring of her BP as well.   No Billing Submitted.   Tessa Lerner, [place], [facility]  Pager: [phone] Office: [phone]

## 2021-02-02 NOTE — Progress Notes (Signed)
I spoke with patient's son Jahnya Trindade preprocedure and also postprocedure.,  Updated them regarding LAD and left main stenting.  Suspect patient has late presentation of anterolateral MI.

## 2021-02-02 NOTE — TOC Benefit Eligibility Note (Signed)
Patient Advocate Encounter ° °Insurance verification completed.   ° °The patient is currently admitted and upon discharge could be taking Brilinta 90 mg. ° °The current 30 day co-pay is, $47.00.  ° °The patient is insured through [employer] Medicare Part D  ° ° ° °Amilyah Nack, CPhT °Pharmacy Patient Advocate Specialist °[facility] Patient Advocate Team °Direct Number: [phone]  Fax: [phone] ° ° ° ° ° °  °

## 2021-02-02 NOTE — Progress Notes (Signed)
Inpatient Diabetes Program Recommendations  AACE/ADA: New Consensus Statement on Inpatient Glycemic Control   Target Ranges:  Prepandial:   less than 140 mg/dL      Peak postprandial:   less than 180 mg/dL (1-2 hours)      Critically ill patients:  140 - 180 mg/dL    Latest Reference Range & Units 02/02/21 06:20 02/02/21 11:29  Glucose-Capillary 70 - 99 mg/dL 415 (H) 334 (H)    Latest Reference Range & Units 02/01/21 14:50 02/01/21 15:47 02/01/21 16:24 02/01/21 19:07 02/01/21 20:07 02/01/21 21:43  Glucose-Capillary 70 - 99 mg/dL 502 (HH) 393 (H) 260 (H) 245 (H) 296 (H) 313 (H)    Latest Reference Range & Units 02/01/21 12:05  CO2 22 - 32 mmol/L 19 (L)  Glucose 70 - 99 mg/dL 554 (HH)  Anion gap 5 - 15  13   Review of Glycemic Control  Diabetes history: DM2 Outpatient Diabetes medications: Glipizide 10 mg BID, 70/30 35 units BID, Metformin 1000 mg BID Current orders for Inpatient glycemic control: Novolog 0-15 units TID with meals; Decadron 6 mg Q24H  Inpatient Diabetes Program Recommendations:    Insulin: If steroids are continued, please consider ordering Levemir 15 units BID (based on 107 kg x 0.3 units), adding Novolog 0-5 units QHS, and Novolog 5 units TID with meals for meal coverage if patient eats at least 50% of meals.  NOTE: Per H&P, "Symptoms started about 3 weeks ago, initially with dry cough and productive with whitish foamy sputum production, and increasing shortness of breath exertional, worsened with walking or lying down.  Denies any chest pain.  She also had episode of subjective fever and night sweats, and she used self COVID kit last week at home to test which turned negative.  Symptoms was worse this morning and then came to the ED.  She stopped taking her insulin last week for " feeling so sick and has to take the cough medications/OTC meds."  Initial glucose 554 mg/dl on 02/01/21 and was initially on IV insulin drip for a short time (started at 14:50 and stopped at  17:04 on 02/01/21). No basal insulin given since admitted. Fasting CBG 415 mg/dl today.   Thanks, Barnie Alderman, RN, MSN, CDE Diabetes Coordinator Inpatient Diabetes Program [phone] (Team Pager from 8am to 5pm)

## 2021-02-02 NOTE — Assessment & Plan Note (Addendum)
-   Insulin 70/30 resumed, also on Glucotrol and metformin, started on Farxiga this admission

## 2021-02-02 NOTE — Progress Notes (Signed)
Patient reporting chest pressure and tightness with pain level 6 out of 10. Patient's O2 sats dropped to 83% on Room air. Notified  MD, placed patient on 3L of O2. Received order for nitro. Administered nitro, obtained EKG. Patient reporting no pain after nitro given. Notified Cath lab.

## 2021-02-02 NOTE — Assessment & Plan Note (Addendum)
On levothyroxine  ?

## 2021-02-02 NOTE — Progress Notes (Addendum)
Progress Note   Patient: Mallory Butler [id number] DOB: [birth year] DOA: 02/01/2021     1 DOS: the patient was seen and examined on 02/02/2021   Brief hospital course: Mrs. Petersen was admitted to the hospital with the working diagnosis of NSTEMI.   71 yo female with the past medical history of T2DM, dyslipidemia, HTN and obesity class 3 who presented with dyspnea and cough. Reported 3 weeks of dry cough, with worsening dyspnea on exertion, along with orthopnea. No chest pain. Last week a home test for COVID 19 was negative. Because persistent symptoms she came to the hospital for further evaluation. On her initial physical examination his blood pressure was 93/59, HR 68, RR 19-22, home 94%-99%, positive rales bilaterally, with increase work of breathing, heart with S1 and S2 present and rhythmic, no murmurs or gallops, abdomen soft, positive lower extremity edema.   Na 134, K 4,1, CL 102. Bicarbonate 19, glucose 554, BUN 19 and cr at 1,12  High sensitive troponin 973, 1058, 1347, 1331, 1302 Lactic acid 2,3  Wbc 8,1 hgb 11,0 hct 34,9 Plt 269  SARS COVID 19 positive  Chest radiograph with cardiomegaly and bilateral hilar vascular congestion.   Ct chest with no pulmonary embolism, bilateral pleural effusion and bilateral ground glass opacities bilaterally.   EKG with 72 bpm, left axis deviation, normal Qtc, sinus rhythm, with ST elevation in lead II, III, AVF, V3-V6 with negative T wave lead I and AVL.   Patient underwent cardiac catheterization, LAD with occlusion in the mid segment after the origin of a large diagonal with had 99% stenosis.  PW 24 with CO 4,2 and CI 2,0.  Underwent mid LAD stent placement with good toleration.     Assessment and Plan Assessment and Plan: * Acute CHF (congestive heart failure) ([place]) Patient continue to have dyspnea and orthopnea. Elevated PW pressure mean 24.  Echocardiogram with LV systolic pressure 30 to [id number], with positive wall motion abnormalities,  anterior wall mid and distal, entire septum and entire apex, mid and distal inferior wall and mid inferoseptal segment hypokinetic.  Preserved RV systolic function. No significant valvular disease.  Blood pressure has been [id number] systolic   Plan to continue diuresis with IV furosemide to target further negative fluid balance.  When patient more stable will start with guideline directed therapy.    NSTEMI (non-ST elevated myocardial infarction) ([place])- (present on admission) Patient sp cardiac catheterization and angioplasty to LAD, sp stent placement.   Plan to continue antiplatelet therapy and statin Pain control with nitroglycerin and IV morphine   COVID-19 virus infection- (present on admission) Patient with no clinical signs of viral pneumonia. Mild elevation in inflammatory markers  Plan to continue with antiviral therapy with Remdesivir for 5 days. Continue with systemic steroids with caution in the setting of acute myocardial infarction.  Continue to follow with inflammatory markers.  Antitussive agents.   COVID-19 Labs  Recent Labs    02/01/21 1205 02/02/21 0345  DDIMER 0.82* 0.76*  FERRITIN  --  1,457*  CRP  --  6.2*    Lab Results  Component Value Date   SARSCOV2NAA POSITIVE (A) 02/01/2021     Type 2 diabetes mellitus with hyperlipidemia ([place])- (present on admission) Patient with uncontrolled hyperglycemia, fastin is 439 Capillary 313, 415, 334, and 278  Plan to continue insulin cover with sliding scale and basal insulin 10 units.  Today she has received 18 units of short acting insulin. Patient with poor oral intake, steroid induce hyperglycemia.  Continue with statin therapy with atorvastatin   Hypothyroid- (present on admission) Continue with levothyroxine   Class 3 obesity ([place])- (present on admission) Calculated BMI is 41.7   AKI (acute kidney injury) ([place])- (present on admission) Renal function with serum cr at 1,22 with K at 4,9 and serum  bicarbonate at 20.  Plan to continue diuresis with furosemide Follow up renal function in am,a void hypotension and nephrotoxic medications   Essential hypertension- (present on admission) Her blood pressure has been low,  Continue with bisoprolol with close follow up on blood pressure.         Subjective: Patient is having precordial chest pain and dyspnea, continue to have orthopnea, no nausea or vomiting,   Objective BP 117/84    Pulse (!) 0    Temp (!) 97.5 F (36.4 C) (Oral)    Resp (!) 21    Ht 5\' 3"  (1.6 m)    Wt 107 kg    SpO2 92%    BMI 41.79 kg/m   Neurology awake and alert  ENT mild pallor but no icterus  Cardiovascular heart with S1 and S2 present and rhythmic with no rubs or gallops,  Positive JVD Positive trace bilateral lower extremity edema  Pulmonary scattered rales but no wheezing  Abdominal protuberant with no distention, not tender to palpation  Skin Musculoskeletal   Data Reviewed:    Family Communication: no family at the bedside   Disposition: Status is: Inpatient Remains inpatient appropriate because: acute coronary syndrome management   Planned Discharge Destination: Home             Author: Tawni Millers, MD 02/02/2021 5:49 PM  For on call review [email or web address].

## 2021-02-02 NOTE — Assessment & Plan Note (Signed)
Calculated BMI is 41.7  

## 2021-02-03 DIAGNOSIS — I5041 Acute combined systolic (congestive) and diastolic (congestive) heart failure: Secondary | ICD-10-CM

## 2021-02-03 DIAGNOSIS — I129 Hypertensive chronic kidney disease with stage 1 through stage 4 chronic kidney disease, or unspecified chronic kidney disease: Secondary | ICD-10-CM

## 2021-02-03 DIAGNOSIS — I2510 Atherosclerotic heart disease of native coronary artery without angina pectoris: Secondary | ICD-10-CM

## 2021-02-03 DIAGNOSIS — Z955 Presence of coronary angioplasty implant and graft: Secondary | ICD-10-CM

## 2021-02-03 DIAGNOSIS — I700 Atherosclerosis of aorta: Secondary | ICD-10-CM

## 2021-02-03 DIAGNOSIS — I255 Ischemic cardiomyopathy: Secondary | ICD-10-CM

## 2021-02-03 LAB — COMPREHENSIVE METABOLIC PANEL
ALT: 94 U/L — ABNORMAL HIGH (ref 0–44)
AST: 205 U/L — ABNORMAL HIGH (ref 15–41)
Albumin: 3.4 g/dL — ABNORMAL LOW (ref 3.5–5.0)
Alkaline Phosphatase: 186 U/L — ABNORMAL HIGH (ref 38–126)
Anion gap: 14 (ref 5–15)
BUN: 38 mg/dL — ABNORMAL HIGH (ref 8–23)
CO2: 20 mmol/L — ABNORMAL LOW (ref 22–32)
Calcium: 9.1 mg/dL (ref 8.9–10.3)
Chloride: 103 mmol/L (ref 98–111)
Creatinine, Ser: 1.18 mg/dL — ABNORMAL HIGH (ref 0.44–1.00)
GFR, Estimated: 50 mL/min — ABNORMAL LOW (ref >60)
Glucose, Bld: 316 mg/dL — ABNORMAL HIGH (ref 70–99)
Potassium: 4.6 mmol/L (ref 3.5–5.1)
Sodium: 137 mmol/L (ref 135–145)
Total Bilirubin: 0.7 mg/dL (ref 0.3–1.2)
Total Protein: 6.8 g/dL (ref 6.5–8.1)

## 2021-02-03 LAB — GLUCOSE, CAPILLARY
Glucose-Capillary: 321 mg/dL — ABNORMAL HIGH (ref 70–99)
Glucose-Capillary: 274 mg/dL — ABNORMAL HIGH (ref 70–99)
Glucose-Capillary: 342 mg/dL — ABNORMAL HIGH (ref 70–99)
Glucose-Capillary: 351 mg/dL — ABNORMAL HIGH (ref 70–99)

## 2021-02-03 LAB — FERRITIN: Ferritin: 1905 ng/mL — ABNORMAL HIGH (ref 11–307)

## 2021-02-03 LAB — C-REACTIVE PROTEIN: CRP: 5.6 mg/dL — ABNORMAL HIGH (ref <1.0)

## 2021-02-03 LAB — D-DIMER, QUANTITATIVE: D-Dimer, Quant: 0.97 ug/mL-FEU — ABNORMAL HIGH (ref 0.00–0.50)

## 2021-02-03 MED ORDER — FUROSEMIDE 10 MG/ML IJ SOLN
20.0000 mg | Freq: Two times a day (BID) | INTRAMUSCULAR | Status: DC
  Administered 2021-02-03 – 2021-02-04 (×2): 20 mg via INTRAVENOUS
  Filled 2021-02-03 (×2): qty 2

## 2021-02-03 MED ORDER — DAPAGLIFLOZIN PROPANEDIOL 10 MG PO TABS
10.0000 mg | ORAL_TABLET | Freq: Every day | ORAL | Status: DC
  Administered 2021-02-03 – 2021-02-06 (×4): 10 mg via ORAL
  Filled 2021-02-03 (×4): qty 1

## 2021-02-03 MED ORDER — INSULIN ASPART 100 UNIT/ML IJ SOLN
3.0000 [IU] | Freq: Three times a day (TID) | INTRAMUSCULAR | Status: DC
  Administered 2021-02-03 – 2021-02-04 (×5): 3 [IU] via SUBCUTANEOUS

## 2021-02-03 MED ORDER — IPRATROPIUM-ALBUTEROL 0.5-2.5 (3) MG/3ML IN SOLN
3.0000 mL | RESPIRATORY_TRACT | Status: DC | PRN
  Administered 2021-02-05: 3 mL via RESPIRATORY_TRACT
  Filled 2021-02-03: qty 3

## 2021-02-03 MED ORDER — INSULIN GLARGINE-YFGN 100 UNIT/ML ~~LOC~~ SOLN
15.0000 [IU] | Freq: Every day | SUBCUTANEOUS | Status: DC
  Administered 2021-02-03: 15 [IU] via SUBCUTANEOUS
  Filled 2021-02-03 (×2): qty 0.15

## 2021-02-03 MED ORDER — IPRATROPIUM-ALBUTEROL 0.5-2.5 (3) MG/3ML IN SOLN
3.0000 mL | Freq: Four times a day (QID) | RESPIRATORY_TRACT | Status: DC
  Administered 2021-02-03 – 2021-02-04 (×4): 3 mL via RESPIRATORY_TRACT
  Filled 2021-02-03 (×4): qty 3

## 2021-02-03 MED ORDER — HYDROCOD POLI-CHLORPHE POLI ER 10-8 MG/5ML PO SUER
5.0000 mL | Freq: Two times a day (BID) | ORAL | Status: DC
  Administered 2021-02-03 – 2021-02-06 (×7): 5 mL via ORAL
  Filled 2021-02-03 (×7): qty 5

## 2021-02-03 NOTE — Progress Notes (Signed)
Progress Note  Patient Name: Mallory Butler Date of Encounter: 02/03/2021  Attending physician: Tawni Millers Primary care provider: Janie Morning, DO   Subjective: Mallory Butler is a 71 y.o. female who was seen and examined at bedside  Denies any chest pain has shortness of breath is improving. Currently in isolation room due to COVID-19 infection Case discussed and reviewed with attending physician.  Objective: Vital Signs in the last 24 hours: Temp:  [97.6 F (36.4 C)-98.6 F (37 C)] 97.8 F (36.6 C) (01/31 1603) Pulse Rate:  [61-73] 61 (01/31 1603) Resp:  [18-20] 20 (01/31 1603) BP: (98-126)/(62-80) 98/68 (01/31 1603) SpO2:  [90 %-99 %] 98 % (01/31 1603) Weight:  [108.9 kg] 108.9 kg (01/31 0500)  Intake/Output:  Intake/Output Summary (Last 24 hours) at 02/03/2021 1817 Last data filed at 02/03/2021 0300 Gross per 24 hour  Intake 120.11 ml  Output --  Net 120.11 ml    Net IO Since Admission: 268.17 mL [02/03/21 1817]  Weights:  Filed Weights   02/01/21 1910 02/02/21 0005 02/03/21 0500  Weight: 106.8 kg 107 kg 108.9 kg    Telemetry: Personally reviewed.  Normal sinus rhythm.  Physical examination: PHYSICAL EXAM: Vitals with BMI 02/03/2021 02/03/2021 02/03/2021  Height - - -  Weight - - -  BMI - - -  Systolic 98 856 314  Diastolic 68 79 62  Pulse 61 68 68     CONSTITUTIONAL: Appears older than stated age, obese, no acute distress.  SKIN: Skin is warm and dry. No rash noted. No cyanosis. No pallor. No jaundice HEAD: Normocephalic and atraumatic.  EYES: No scleral icterus MOUTH/THROAT: Dry oral membranes.  NECK: JVP difficult to evaluate due to short neck stature and adipose tissue.  No thyromegaly noted. No carotid bruits  LYMPHATIC: No visible cervical adenopathy.  CHEST Normal respiratory effort. No intercostal retractions  LUNGS: Clear to auscultation upper lung fields with decreased breath sounds at the bases.  No stridor. No wheezes.   Bilateral rales.  CARDIOVASCULAR: Regular rate and rhythm, positive S1-S2, no murmurs rubs or gallops appreciated ABDOMINAL: Obese, soft, nontender, nondistended, positive bowel sounds in all 4 quadrants, no apparent ascites.  EXTREMITIES: Warm to touch, +1 ankle and feet pitting edema, palpable DP and PT pulses bilaterally.  HEMATOLOGIC: No significant bruising NEUROLOGIC: Oriented to person, place, and time. Nonfocal. Normal muscle tone.  PSYCHIATRIC: Normal mood and affect. Normal behavior. Cooperative   Lab Results: Hematology Recent Labs  Lab 02/01/21 1205 02/02/21 0345 02/02/21 0946 02/02/21 0951  WBC 8.1 10.1  --   --   RBC 3.50* 3.35*  --   --   HGB 11.0* 10.5* 10.2* 10.2*  HCT 34.9* 32.7* 30.0* 30.0*  MCV 99.7 97.6  --   --   MCH 31.4 31.3  --   --   MCHC 31.5 32.1  --   --   RDW 13.7 13.9  --   --   PLT 269 268  --   --     Chemistry Recent Labs  Lab 02/01/21 1205 02/01/21 1959 02/02/21 0345 02/02/21 0946 02/02/21 0951 02/03/21 0336  NA 134* 134* 135 138 137 137  K 4.1 4.3 4.9 4.4 4.3 4.6  CL 102 102 101  --   --  103  CO2 19* 18* 20*  --   --  20*  GLUCOSE 554* 309* 439*  --   --  316*  BUN 19 21 25*  --   --  38*  CREATININE  1.12* 1.22* 1.22*  --   --  1.18*  CALCIUM 8.4* 8.7* 8.7*  --   --  9.1  PROT 6.7  --  7.2  --   --  6.8  ALBUMIN 2.8*  --  3.2*  --   --  3.4*  AST 24  --  47*  --   --  205*  ALT 20  --  28  --   --  94*  ALKPHOS 91  --  141*  --   --  186*  BILITOT 0.4  --  0.6  --   --  0.7  GFRNONAA 53* 48* 48*  --   --  50*  ANIONGAP '13 14 14  ' --   --  14     Cardiac Enzymes: Cardiac Panel (last 3 results) Recent Labs    02/01/21 2350 02/02/21 0345 02/02/21 0550  TROPONINIHS 1,347* 1,331* 1,302*    BNP (last 3 results) Recent Labs    02/01/21 1205  BNP 647.1*    ProBNP (last 3 results) No results for input(s): PROBNP in the last 8760 hours.   DDimer  Recent Labs  Lab 02/01/21 1205 02/02/21 0345 02/03/21 0336   DDIMER 0.82* 0.76* 0.97*     Hemoglobin A1c:  Lab Results  Component Value Date   HGBA1C 12.0 (H) 02/01/2021   MPG 297.7 02/01/2021    TSH  Recent Labs    02/01/21 1959  TSH 3.953    Lipid Panel     Component Value Date/Time   CHOL 140 02/02/2021 0345   TRIG 120 02/02/2021 0345   HDL 37 (L) 02/02/2021 0345   CHOLHDL 3.8 02/02/2021 0345   VLDL 24 02/02/2021 0345   LDLCALC 79 02/02/2021 0345    Imaging: CARDIAC CATHETERIZATION  Result Date: 02/02/2021   Mid Cx lesion is 90% stenosed.   Ost Cx to Prox Cx lesion is 30% stenosed.   1st Diag lesion is 99% stenosed.   Prox LAD to Mid LAD lesion is 100% stenosed.   A drug-eluting stent was successfully placed using a STENT ONYX FRONTIER 2.5X26.   A drug-eluting stent was successfully placed using a STENT ONYX FRONTIER [id number].   Post intervention, there is a 0% residual stenosis. Right and left Heart Catheterization 02/02/21: RA: 16/14, mean 14 mmHg RV 52/15, EDP 17 mmHg PA 50/22, mean 37 mmHg. PW 27/29, mean 24 mmHg. QP/QS 1.00.  CO 4.2, CI 2.02 by Fick. LV: 112/15, EDP 29 mmHg.  Ao: 97/60, mean 76 mmHg.  There was no pressure gradient across the aortic valve. LM: Mildly calcified but widely patent. LAD: Large vessel in the proximal segment, moderately calcified, occluded in the midsegment after the origin of a large diagonal 1 which has ostial 99% stenosis. CX: Large vessel, proximal segment has a 30% stenosis, large OM1 with a proximal 20 to 30% stenosis, AV groove has 90 to 95% stenosis in the proximal to mid segment. RCA: Large vessel, mild luminal irregularity. Interventional data: Successful PTCA and stenting of the mid LAD with implantation of a 2.5 x 26 mm Onyx frontier DES, proximally into the proximal LAD and into the distal LM 82.75 x 12 mm Onyx frontier stent deployed postdilated with 3.5 x 8 mm Hauser balloon at 20 atm pressure for 30 seconds x 2.  Complete expansion of the stent into the left main.  No sidebranch jailing of D1  which has ostial 99% stenosis.  TIMI 0 to TIMI-3 flow at the end of the procedure.  110 mL contrast utilized. Recommendation: Continue aspirin indefinitely and Brilinta for at least a period of 1 year.  Medical therapy for circumflex disease unless she has recurrence of angina pectoris.  Medical therapy for D1 disease.    CARDIAC DATABASE: EKG: 02/01/2021: NSR, 72 bpm, LVH per voltage criteria,PRWP, subtle ST-T changes.   02/01/2021: NSR, 68 bpm, LVH per voltage criteria, poor R wave progression, suble ST elevation V3-V6 without reciprocal changes.   02/01/2021: NSR, 72 bpm, LVH voltage criteria, PR WP, suble ST elevation V3-V6 without reciprocal changes, similar to prior EKG.   Echocardiogram:  1. Left ventricular ejection fraction, by estimation, is 30 to 35%. The  left ventricle has moderately decreased function. The left ventricle  demonstrates regional wall motion abnormalities (see scoring  diagram/findings for description). There is mild  left ventricular hypertrophy. Left ventricular diastolic parameters are  consistent with Grade I diastolic dysfunction (impaired relaxation).  Elevated left ventricular end-diastolic pressure. The mid and distal  anterior wall, entire anterior septum,  entire apex, mid and distal inferior wall, and mid inferoseptal segment  are hypokinetic.   2. Right ventricular systolic function is normal. The right ventricular  size is normal. There is normal pulmonary artery systolic pressure. The  estimated right ventricular systolic pressure is 86.7 mmHg.   3. The mitral valve is degenerative. Mild mitral valve regurgitation. No  evidence of mitral stenosis.   4. The aortic valve was not well visualized. Aortic valve regurgitation  is not visualized. No aortic stenosis is present.   5. The inferior vena cava is dilated in size with <50% respiratory  variability, suggesting right atrial pressure of 15 mmHg.   Conclusion(s)/Recommendation(s): No left  ventricular mural or apical  thrombus/thrombi.   Heart catheterization: 02/02/2021:   Mid Cx lesion is 90% stenosed.   Ost Cx to Prox Cx lesion is 30% stenosed.   1st Diag lesion is 99% stenosed.   Prox LAD to Mid LAD lesion is 100% stenosed.   A drug-eluting stent was successfully placed using a STENT ONYX FRONTIER 2.5X26.   A drug-eluting stent was successfully placed using a STENT ONYX FRONTIER [id number].   Post intervention, there is a 0% residual stenosis.   Right and left Heart Catheterization 02/02/21:   RA: 16/14, mean 14 mmHg RV 52/15, EDP 17 mmHg PA 50/22, mean 37 mmHg. PW 27/29, mean 24 mmHg. QP/QS 1.00.  CO 4.2, CI 2.02 by Fick.   LV: 112/15, EDP 29 mmHg.  Ao: 97/60, mean 76 mmHg.  There was no pressure gradient across the aortic valve. LM: Mildly calcified but widely patent. LAD: Large vessel in the proximal segment, moderately calcified, occluded in the midsegment after the origin of a large diagonal 1 which has ostial 99% stenosis. CX: Large vessel, proximal segment has a 30% stenosis, large OM1 with a proximal 20 to 30% stenosis, AV groove has 90 to 95% stenosis in the proximal to mid segment. RCA: Large vessel, mild luminal irregularity.   Interventional data: Successful PTCA and stenting of the mid LAD with implantation of a 2.5 x 26 mm Onyx frontier DES, proximally into the proximal LAD and into the distal LM 82.75 x 12 mm Onyx frontier stent deployed postdilated with 3.5 x 8 mm [employer] balloon at 20 atm pressure for 30 seconds x 2.  Complete expansion of the stent into the left main.  No sidebranch jailing of D1 which has ostial 99% stenosis.  TIMI 0 to TIMI-3 flow at the end of the procedure.  [place]  mL contrast utilized.  Recommendation: Continue aspirin indefinitely and Brilinta for at least a period of 1 year.  Medical therapy for circumflex disease unless she has recurrence of angina pectoris.  Medical therapy for D1 disease  Scheduled Meds:  aspirin EC  81 mg Oral Daily    atorvastatin  40 mg Oral Daily   bisoprolol  5 mg Oral Daily   chlorpheniramine-HYDROcodone  5 mL Oral Q12H   dapagliflozin propanediol  10 mg Oral Daily   furosemide  20 mg Intravenous Q12H   guaiFENesin  1,200 mg Oral BID   heparin  5,000 Units Subcutaneous Q8H   insulin aspart  0-15 Units Subcutaneous TID WC & HS   insulin aspart  3 Units Subcutaneous TID WC   insulin glargine-yfgn  15 Units Subcutaneous Daily   ipratropium-albuterol  3 mL Nebulization Q6H   levothyroxine  88 mcg Oral Q0600   methylPREDNISolone (SOLU-MEDROL) injection  40 mg Intravenous Daily   pantoprazole  40 mg Oral Daily   sodium chloride flush  3 mL Intravenous Q12H   sodium chloride flush  3 mL Intravenous Q12H   sodium chloride flush  3 mL Intravenous Q12H   ticagrelor  90 mg Oral BID   venlafaxine XR  150 mg Oral Q breakfast    Continuous Infusions:  sodium chloride     sodium chloride     remdesivir 100 mg in NS 100 mL 100 mg (02/03/21 1120)    PRN Meds: sodium chloride, sodium chloride, acetaminophen, dextrose, ipratropium-albuterol, morphine injection, nitroGLYCERIN, ondansetron (ZOFRAN) IV, oxybutynin, sodium chloride flush, sodium chloride flush, traZODone   IMPRESSION & RECOMMENDATIONS: SYLVANA BONK is a 71 y.o. Caucasian female whose past medical history and cardiac risk factors include: CAD s/p angioplasty/PCI, acute systolic and diastolic heart failure, ischemic cardiomyopathy, IDDM Type II, HTN, HLD, postmenopausal female.  Impression: Non-STEMI COVID-19 infection Acute on chronic systolic and diastolic heart failure, stage C, NYHA class II/III Ischemic cardiomyopathy Established CAD status post angioplasty and stent LM/LAD without angina pectoris Insulin-dependent diabetes mellitus type 2, uncontrolled. Coronary artery calcification. Aortic atherosclerosis. 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 Diabetes with complications of CKD, hypoglycemia, hyperlipidemia Obesity  due to excess calories  Plan: Cardiology consulted for NSTEMI evaluation.  Given her symptoms, EKG findings, and elevated high sensitive troponin markers she was recommended to undergo left heart catheterization with possible intervention.  Patient was noted to have multivessel CAD and underwent intervention to the LM/LAD status post PTCA and PCI.  Recommendations are to treat the LCx and diagonal 1 disease medically for now.  Continue dual antiplatelet therapy for 1 year and aspirin indefinitely.  Currently patient is chest pain-free.  Echocardiogram notes moderately reduced LVEF with grade 1 diastolic impairment.  Currently on Lasix 40 mg IV push twice daily.  We will reduce that to 20 mg IV push twice daily.  Start Farxiga 10 mg p.o. daily we will uptitrate GDMT in a stepwise fashion with the initial focus being diuresis.  Continue telemetry to evaluate for dysrhythmias.  Patient is educated in the importance of improving her modifiable cardiovascular risk factors.  Recent hemoglobin A1c 12.0 will defer insulin management to primary team.  Recommend a goal LDL of 50 mg/dL.  Continue statin therapy.  We will need to monitor LFTs.  Patient is currently in isolation due to COVID-19 infection and being treated with remdesivir and supportive care per primary team.  Plan of care discussed with both primary team and attending physician.  Patient's questions and  concerns were addressed to her satisfaction. She voices understanding of the instructions provided during this encounter.   This note was created using a voice recognition software as a result there may be grammatical errors inadvertently enclosed that do not reflect the nature of this encounter. Every attempt is made to correct such errors.  Mechele Claude [facility]  Pager: [phone] Office: [phone] 02/03/2021, 6:17 PM

## 2021-02-03 NOTE — Progress Notes (Signed)
CARDIAC REHAB PHASE I   MI/Stent education completed with pt via telephone due to Covid restrictions. Pt educated on importance of ASA and Brilinta. Reviewed site care, restrictions, and exercise guidelines with emphasis on safety. Pt states she recently worked with PT and felt good. Pt appreciative for information and denies further questions or concerns at this time.  [phone] Reynold Bowen, RN BSN 02/03/2021 2:50 PM

## 2021-02-03 NOTE — Progress Notes (Signed)
Progress Note   Patient: Mallory Butler [id number] DOB: 10/20/1950 DOA: 02/01/2021     2 DOS: the patient was seen and examined on 02/03/2021   Brief hospital course: [name] was admitted to the hospital with the working diagnosis of NSTEMI, complicated with acute heart failure decompensation in the setting of acute SARS COVID 19 infection.    71 yo female with the past medical history of T2DM, dyslipidemia, HTN and obesity class 3 who presented with dyspnea and cough. Reported 3 weeks of dry cough, with worsening dyspnea on exertion, along with orthopnea. No chest pain. Last week a home test for COVID 19 was negative. Because persistent symptoms she came to the hospital for further evaluation. On her initial physical examination his blood pressure was 93/59, HR 68, RR 19-22, home 94%-99%, positive rales bilaterally, with increase work of breathing, heart with S1 and S2 present and rhythmic, no murmurs or gallops, abdomen soft, positive lower extremity edema.   Na 134, K 4,1, CL 102. Bicarbonate 19, glucose 554, BUN 19 and cr at 1,12  High sensitive troponin 973, 1058, 1347, 1331, 1302 Lactic acid 2,3  Wbc 8,1 hgb 11,0 hct 34,9 Plt 269  SARS COVID 19 positive  Chest radiograph with cardiomegaly and bilateral hilar vascular congestion.   Ct chest with no pulmonary embolism, bilateral pleural effusion and bilateral ground glass opacities bilaterally.   EKG with 72 bpm, left axis deviation, normal Qtc, sinus rhythm, with ST elevation in lead II, III, AVF, V3-V6 with negative T wave lead I and AVL.   Patient underwent cardiac catheterization, LAD with occlusion in the mid segment after the origin of a large diagonal with had 99% stenosis.  PW 24 with CO 4,2 and CI 2,0.  Underwent mid LAD stent placement with good toleration.   Patient has been placed on diuresis with good response.   Started on antiviral therapy and systemic corticosteroids.    Assessment and Plan Assessment and  Plan: * Acute CHF (congestive heart failure) (HCC) Echocardiogram with LV systolic pressure 30 to [id number], with positive wall motion abnormalities, anterior wall mid and distal, entire septum and entire apex, mid and distal inferior wall and mid inferoseptal segment hypokinetic.  Preserved RV systolic function. No significant valvular disease.   No documentation of urine output, but patient is feeling better and dyspnea has improved. Blood pressure systolic 123XX123 to 123XX123 mmHg.   Plan to continue diuresis with furosemide. Adding empagliflozin  Further guideline directed therapy depending on blood pressure.    NSTEMI (non-ST elevated myocardial infarction) ([place])- (present on admission) sp cardiac catheterization and angioplasty to LAD, sp stent placement.   No further chest pain, plan to continue with ticagrelor and aspirin. Mild elevation of LFT for now will continue with statin therapy in the setting of acute coronary syndrome.   COVID-19 virus infection- (present on admission) RR: 20  Pulse oxymetry: 95 to 100 %  Fi02: 98% on room air.   COVID-19 Labs  Recent Labs    02/01/21 1205 02/02/21 0345 02/03/21 0336  DDIMER 0.82* 0.76* 0.97*  FERRITIN  --  1,457* 1,905*  CRP  --  6.2* 5.6*    Lab Results  Component Value Date   SARSCOV2NAA POSITIVE (A) 02/01/2021   Elevated liver enzymes possible due to acute COVID 19 infection, continue close monitoring. \ AST 205 and ALT 94  Dyspnea has improved, CRP is trending down, continue with high ferritin and d dimer.   Continue with with Remdesivir for antiviral therapy.  Systemic corticosteroids with methylprednisolone 40 mg daily.  Bronchodilator therapy and antitussive agents Airway clearing techniques with flutter valve and incentive spirometer.  PT and OT Out of bed to chair tid with meals      Type 2 diabetes mellitus with hyperlipidemia ([place])- (present on admission) Patient continue to have hyperglycemia with fasting glucose  316 and capillary 307 and 321/   Will increase basal insulin to 15 units and will add pre-meal insulin 3 units. Continue with insulin sliding scale for glucose cover and monitoring.    On atorvastatin   Hypothyroid- (present on admission) On levothyroxine   Class 3 obesity ([place])- (present on admission) Calculated BMI is 41.7   AKI (acute kidney injury) ([place])- (present on admission) Improved renal function with serum cr at 1,18 with K at 4,6 and serum bicarbonate at 20. Continue diuresis with furosemide and follow renal function in am, avoid hypotension and nephrotoxic medications.   Essential hypertension- (present on admission) Continue blood pressure control with bisoprolol.          Subjective: Patient is feeling better, dyspnea is improving, and no further chest pain, no nausea or vomiting  Objective BP 126/79 (BP Location: Left Arm)    Pulse 68    Temp 98.6 F (37 C) (Oral)    Resp 20    Ht 5\' 3"  (1.6 m)    Wt 108.9 kg    SpO2 96%    BMI 42.53 kg/m   Neurology awake and alert ENT no pallor or icterus  Cardiovascular heart with S1 and S2 present and rhythmic, with no gallops or murmurs. Mild JVD Trace lower extremity edema  Pulmonary scattered rales more at bases, no wheezing or rhonchi  Abdominal protuberant but not distended  Skin no rashes  Musculoskeletal bilateral knee hypertrophy    Data Reviewed:    Family Communication: no family at the bedside. I spoke over the phone with the patient's son about patient's  condition, plan of care, prognosis and all questions were addressed.   Disposition: Status is: Inpatient Remains inpatient appropriate because: heart failure and COVID 19 management   Planned Discharge Destination: Home             Author: Tawni Millers, MD 02/03/2021 9:28 AM  For on call review [email or web address].

## 2021-02-03 NOTE — Progress Notes (Signed)
CARDIAC REHAB PHASE I   Tried calling pt on room phone to completed MI/stent education, no answer at this time. Materials left with pt earlier. Will place CRP II [place] order to meet the requirements.  Will f/u and educate as able.  Rufina Falco, RN BSN 02/03/2021 1:14 PM

## 2021-02-03 NOTE — Consult Note (Signed)
[facility] CM Inpatient Consult   02/03/2021  Mallory Butler 12-21-1950 [id number]  [place] Management [facility] CM)   Patient chart reviewed for potential [facility] CM post hospital chronic disease management and care coordination services. Noted medium risk score for unplanned readmission.  Plan: Will continue to follow for progression and disposition plans. If appropriate, referral for post hospital care management services may be made.  Of note, [facility] Care Management services does not replace or interfere with any services that are arranged by inpatient case management or social work.   Netta Cedars, MSN, RN [facility] [employer] [phone]  Toll free office [phone]

## 2021-02-03 NOTE — TOC Initial Note (Signed)
Transition of Care [facility]) - Initial/Assessment Note    Patient Details  Name: Mallory Butler MRN: [id number] Date of Birth: 11/27/1950  Transition of Care [facility]) CM/SW Contact:    Bethena Roys, RN Phone Number: 02/03/2021, 4:33 PM  Clinical Narrative:  Case Manager spoke to the patient and she states she is from home alone. Case Manager spoke with the patient's son which was at the bedside. We discussed home health services and the son wanted to discuss with his mother before arrangements were made. Case Manager will continue to follow for disposition needs.              Expected Discharge Plan: [place] Barriers to Discharge: Continued Medical Work up      Expected Discharge Plan and Services Expected Discharge Plan: [place]   Discharge Planning Services: CM Consult Post Acute Care Choice: [place] arrangements for the past 2 months: Single Family Home                   Prior Living Arrangements/Services Living arrangements for the past 2 months: Single Family Home Lives with:: Self (Has support of son.) Patient language and need for interpreter reviewed:: Yes        Need for Family Participation in Patient Care: Yes (Comment) Care giver support system in place?: Yes (comment)   Criminal Activity/Legal Involvement Pertinent to Current Situation/Hospitalization: No - Comment as needed           Emotional Assessment         Alcohol / Substance Use: Not Applicable Psych Involvement: No (comment)  Admission diagnosis:  Shortness of breath [R06.02] Pleural effusion [J90] Abnormal EKG [R94.31] NSTEMI (non-ST elevated myocardial infarction) ([place]) [I21.4] Elevated troponin I level [R77.8] Hyperglycemia without ketosis [R73.9] COVID-19 [U07.1] Patient Active Problem List   Diagnosis Date Noted   Type 2 diabetes mellitus with hyperlipidemia ([place]) 02/02/2021   Essential hypertension 02/02/2021   AKI (acute  kidney injury) ([place]) 02/02/2021   Class 3 obesity ([place]) 02/02/2021   Hypothyroid 02/02/2021   LAD stenosis    NSTEMI (non-ST elevated myocardial infarction) ([place]) 02/01/2021   Acute CHF (congestive heart failure) ([place]) 02/01/2021   COVID-19 virus infection 02/01/2021   Type 2 diabetes mellitus without complication, with long-term current use of insulin ([place]) 05/18/2017   Diabetes ([place]) 07/14/2015   Hypertension 07/14/2015   Familial hyperlipidemia 07/14/2015   Hypothyroidism 07/14/2015   Obstructive sleep apnea 07/14/2015   PCP:  Janie Morning, DO Pharmacy:   [id number] - [place], [place] [address]  [address] Phone: [phone] [phone]     Readmission Risk Interventions No flowsheet data found.

## 2021-02-03 NOTE — Evaluation (Signed)
Physical Therapy Evaluation Patient Details Name: Mallory Butler MRN: [id number] DOB: 21-Nov-1950 Today's Date: 02/03/2021  History of Present Illness  71 yo female with the past medical history of T2DM, dyslipidemia, HTN and obesity class 3 who presented with dyspnea and cough. Reported 3 weeks of dry cough, with worsening dyspnea on exertion, along with orthopnea. No chest pain. Last week a home test for COVID 19 was negative. Because persistent symptoms she came to the hospital for further evaluation. On her initial physical examination his blood pressure was 93/59, HR 68, RR 19-22, home 94%-99%, positive rales bilaterally, with increase work of breathing, heart with S1 and S2 present and rhythmic, no murmurs or gallops, abdomen soft, positive lower extremity edema. Covid test positive here. Also found to have NSTEMI. Patient underwent cardiac catheterization, LAD with occlusion in the mid segment after the origin of a large diagonal with had 99% stenosis. Underwent mid LAD stent placement.   Clinical Impression  Patient received in recliner, RN had just gotten her up. Patient agreeable to PT assessment. Reports she is feeling bad. Requires min guard for sit to stand and for ambulation in room without AD. She is unsteady and weak with ambulation. O2 sats dropping to 90% with 20 feet in room. Patient is deconditioned and will benefit from continued skilled PT while here to improve functional independence, activity tolerance and strength to return home at discharge.          Recommendations for follow up therapy are one component of a multi-disciplinary discharge planning process, led by the attending physician.  Recommendations may be updated based on patient status, additional functional criteria and insurance authorization.  Follow Up Recommendations Home health PT    Assistance Recommended at Discharge None  Patient can return home with the following  A little help with walking and/or  transfers;A little help with bathing/dressing/bathroom;Assistance with cooking/housework;Help with stairs or ramp for entrance    Equipment Recommendations None recommended by PT  Recommendations for Other Services       Functional Status Assessment Patient has had a recent decline in their functional status and demonstrates the ability to make significant improvements in function in a reasonable and predictable amount of time.     Precautions / Restrictions Precautions Precautions: Fall Restrictions Weight Bearing Restrictions: No      Mobility  Bed Mobility               General bed mobility comments: patient received in recliner and remained in recliner    Transfers Overall transfer level: Modified independent Equipment used: None                    Ambulation/Gait Ambulation/Gait assistance: Min guard Gait Distance (Feet): 20 Feet Assistive device: None Gait Pattern/deviations: Step-through pattern, Decreased step length - right, Decreased step length - left, Trunk flexed Gait velocity: decr     General Gait Details: patient ambulated to bathroom with min guard. Holding to bed along the way. She is fatigued with ambulation and O2 saturations dropped to 90% on 2 liters O2.  Stairs            Wheelchair Mobility    Modified Rankin (Stroke Patients Only)       Balance Overall balance assessment: Needs assistance Sitting-balance support: Feet supported Sitting balance-Leahy Scale: Good     Standing balance support: Single extremity supported, During functional activity Standing balance-Leahy Scale: Fair Standing balance comment: benefits from at least single UE support  Pertinent Vitals/Pain Pain Assessment Pain Assessment: No/denies pain    Home Living Family/patient expects to be discharged to:: Private residence Living Arrangements: Alone Available Help at Discharge: Family;Available  PRN/intermittently             Home Equipment: Agricultural consultant (2 wheels)      Prior Function Prior Level of Function : Independent/Modified Independent             Mobility Comments: patient was not using AD prior to admission ADLs Comments: independent     Hand Dominance        Extremity/Trunk Assessment   Upper Extremity Assessment Upper Extremity Assessment: Generalized weakness    Lower Extremity Assessment Lower Extremity Assessment: Generalized weakness    Cervical / Trunk Assessment Cervical / Trunk Assessment: Normal  Communication   Communication: No difficulties  Cognition Arousal/Alertness: Awake/alert Behavior During Therapy: WFL for tasks assessed/performed Overall Cognitive Status: Within Functional Limits for tasks assessed                                 General Comments: very pleasant and motivated to regain strength        General Comments      Exercises     Assessment/Plan    PT Assessment Patient needs continued PT services  PT Problem List Decreased strength;Decreased mobility;Decreased activity tolerance;Decreased balance;Cardiopulmonary status limiting activity       PT Treatment Interventions Therapeutic exercise;DME instruction;Gait training;Balance training;Stair training;Functional mobility training;Therapeutic activities;Patient/family education    PT Goals (Current goals can be found in the Care Plan section)  Acute Rehab PT Goals Patient Stated Goal: to get stronger, return home PT Goal Formulation: With patient Time For Goal Achievement: 02/17/21 Potential to Achieve Goals: Good    Frequency Min 3X/week     Co-evaluation               AM-PAC PT "6 Clicks" Mobility  Outcome Measure Help needed turning from your back to your side while in a flat bed without using bedrails?: A Little Help needed moving from lying on your back to sitting on the side of a flat bed without using bedrails?: A  Little Help needed moving to and from a bed to a chair (including a wheelchair)?: A Little Help needed standing up from a chair using your arms (e.g., wheelchair or bedside chair)?: A Little Help needed to walk in hospital room?: A Little Help needed climbing 3-5 steps with a railing? : A Lot 6 Click Score: 17    End of Session Equipment Utilized During Treatment: Oxygen Activity Tolerance: Patient limited by fatigue Patient left: in chair;with call bell/phone within reach Nurse Communication: Mobility status PT Visit Diagnosis: Muscle weakness (generalized) (M62.81);Difficulty in walking, not elsewhere classified (R26.2)    Time: 4496-7591 PT Time Calculation (min) (ACUTE ONLY): 16 min   Charges:   PT Evaluation $PT Eval Moderate Complexity: 1 Mod          Luka Reisch, PT, GCS 02/03/21,2:31 PM

## 2021-02-03 NOTE — Progress Notes (Signed)
Inpatient Diabetes Program Recommendations  AACE/ADA: New Consensus Statement on Inpatient Glycemic Control (2015)  Target Ranges:  Prepandial:   less than 140 mg/dL      Peak postprandial:   less than 180 mg/dL (1-2 hours)      Critically ill patients:  140 - 180 mg/dL   Lab Results  Component Value Date   GLUCAP 321 (H) 02/03/2021   HGBA1C 12.0 (H) 02/01/2021    Review of Glycemic Control  Diabetes history: DM 2 Outpatient Diabetes medications: Glipizide 10 mg bid, 70/30 35 units bid, metformin 1000 mg bid Current orders for Inpatient glycemic control:  Semglee 15 units Daily Novolog 3 units tid meal coverage Novolog 0-15 units 4x/day  Solumedrol 40 mg Daily dose change 1/31  A1c 12% this admission  Spoke with pt at bedside regarding glucose control and A1c level. Pt reports seeing her PCP for DM management. Pt reports frequent hypoglycemia around 1-2 am and higher glucose trends during the day. Pt reports staying up late to watch TV and sleeping till later morning. Pt does not eat but mostly 1 meal a day in the evenings for supper. Discussed diet changes. Encouraged pt to get referral for Endocrinologist. Due to pt eating habits and routine 70/30 is not an ideal insulin for her to be on. She would benefit mostly from basal bolus therapy. Discussed this with pt. Discussed current A1c level. Reviewed glucose and A1c goals.  Thanks,  Christena Deem RN, MSN, BC-ADM Inpatient Diabetes Coordinator Team Pager [phone] (8a-5p)

## 2021-02-04 ENCOUNTER — Other Ambulatory Visit (HOSPITAL_COMMUNITY): Payer: Self-pay

## 2021-02-04 DIAGNOSIS — I2510 Atherosclerotic heart disease of native coronary artery without angina pectoris: Secondary | ICD-10-CM

## 2021-02-04 DIAGNOSIS — I255 Ischemic cardiomyopathy: Secondary | ICD-10-CM

## 2021-02-04 DIAGNOSIS — I5041 Acute combined systolic (congestive) and diastolic (congestive) heart failure: Secondary | ICD-10-CM

## 2021-02-04 DIAGNOSIS — R0602 Shortness of breath: Secondary | ICD-10-CM

## 2021-02-04 DIAGNOSIS — I700 Atherosclerosis of aorta: Secondary | ICD-10-CM

## 2021-02-04 LAB — COMPREHENSIVE METABOLIC PANEL
ALT: 112 U/L — ABNORMAL HIGH (ref 0–44)
AST: 157 U/L — ABNORMAL HIGH (ref 15–41)
Albumin: 3.3 g/dL — ABNORMAL LOW (ref 3.5–5.0)
Alkaline Phosphatase: 257 U/L — ABNORMAL HIGH (ref 38–126)
Anion gap: 13 (ref 5–15)
BUN: 53 mg/dL — ABNORMAL HIGH (ref 8–23)
CO2: 21 mmol/L — ABNORMAL LOW (ref 22–32)
Calcium: 9 mg/dL (ref 8.9–10.3)
Chloride: 104 mmol/L (ref 98–111)
Creatinine, Ser: 1.63 mg/dL — ABNORMAL HIGH (ref 0.44–1.00)
GFR, Estimated: 34 mL/min — ABNORMAL LOW (ref >60)
Glucose, Bld: 217 mg/dL — ABNORMAL HIGH (ref 70–99)
Potassium: 4.1 mmol/L (ref 3.5–5.1)
Sodium: 138 mmol/L (ref 135–145)
Total Bilirubin: 0.3 mg/dL (ref 0.3–1.2)
Total Protein: 6.9 g/dL (ref 6.5–8.1)

## 2021-02-04 LAB — GLUCOSE, CAPILLARY
Glucose-Capillary: 182 mg/dL — ABNORMAL HIGH (ref 70–99)
Glucose-Capillary: 239 mg/dL — ABNORMAL HIGH (ref 70–99)
Glucose-Capillary: 389 mg/dL — ABNORMAL HIGH (ref 70–99)
Glucose-Capillary: 366 mg/dL — ABNORMAL HIGH (ref 70–99)

## 2021-02-04 LAB — D-DIMER, QUANTITATIVE: D-Dimer, Quant: 1.98 ug/mL-FEU — ABNORMAL HIGH (ref 0.00–0.50)

## 2021-02-04 LAB — FERRITIN: Ferritin: 912 ng/mL — ABNORMAL HIGH (ref 11–307)

## 2021-02-04 LAB — C-REACTIVE PROTEIN: CRP: 5.1 mg/dL — ABNORMAL HIGH (ref <1.0)

## 2021-02-04 MED ORDER — INSULIN GLARGINE-YFGN 100 UNIT/ML ~~LOC~~ SOLN
20.0000 [IU] | Freq: Every day | SUBCUTANEOUS | Status: DC
  Administered 2021-02-04: 20 [IU] via SUBCUTANEOUS
  Filled 2021-02-04 (×2): qty 0.2

## 2021-02-04 MED ORDER — IPRATROPIUM-ALBUTEROL 0.5-2.5 (3) MG/3ML IN SOLN
3.0000 mL | Freq: Two times a day (BID) | RESPIRATORY_TRACT | Status: DC
  Administered 2021-02-04 – 2021-02-06 (×4): 3 mL via RESPIRATORY_TRACT
  Filled 2021-02-04 (×4): qty 3

## 2021-02-04 MED ORDER — DEXAMETHASONE 6 MG PO TABS
6.0000 mg | ORAL_TABLET | Freq: Every day | ORAL | Status: DC
  Administered 2021-02-05 – 2021-02-06 (×2): 6 mg via ORAL
  Filled 2021-02-04 (×2): qty 1

## 2021-02-04 NOTE — TOC Benefit Eligibility Note (Signed)
Patient Product/process development scientist completed.    The patient is currently admitted and upon discharge could be taking Farxiga 10 mg.  The current 30 day co-pay is, $47.00.   The patient is insured through [employer] Part D     Roland Earl, CPhT Pharmacy Patient Advocate Specialist [facility] Patient Advocate Team Direct Number: [phone]  Fax: [phone]

## 2021-02-04 NOTE — Progress Notes (Signed)
Progress Note  Patient Name: Mallory Butler Date of Encounter: 02/04/2021  Attending physician: Domenic Polite, MD Primary care provider: Janie Morning, DO   Subjective: Mallory Butler is a 71 y.o. female who was seen and examined at bedside  Denies chest pain. Shortness of breath is improving. Recently had a breathing treatment. No orthopnea, paroxysmal nocturnal dyspnea.  Lower extremity swelling improving. Remains in isolation due to COVID-19 pneumonia  Objective: Vital Signs in the last 24 hours: Temp:  [97.6 F (36.4 C)-98 F (36.7 C)] 97.6 F (36.4 C) (02/01 0528) Pulse Rate:  [61-66] 66 (02/01 0528) Resp:  [20-24] 24 (02/01 0528) BP: (91-110)/(61-68) 110/67 (02/01 0528) SpO2:  [90 %-99 %] 98 % (02/01 0528) Weight:  [108.8 kg] 108.8 kg (02/01 0528)  Intake/Output:  Intake/Output Summary (Last 24 hours) at 02/04/2021 0904 Last data filed at 02/04/2021 0200 Gross per 24 hour  Intake --  Output 600 ml  Net -600 ml    Net IO Since Admission: -331.83 mL [02/04/21 0904]  Weights:  Filed Weights   02/02/21 0005 02/03/21 0500 02/04/21 0528  Weight: 107 kg 108.9 kg 108.8 kg    Telemetry: Personally reviewed.  Normal sinus rhythm.  Physical examination: PHYSICAL EXAM: Vitals with BMI 02/04/2021 02/03/2021 02/03/2021  Height - - -  Weight 239 lbs 13 oz - -  BMI 97.67 - -  Systolic 341 91 98  Diastolic 67 61 68  Pulse 66 66 61     CONSTITUTIONAL: Appears older than stated age, obese, no acute distress.  SKIN: Skin is warm and dry. No rash noted. No cyanosis. No pallor. No jaundice NECK: JVP difficult to evaluate due to short neck stature and adipose tissue.  No thyromegaly noted. No carotid bruits  CHEST Normal respiratory effort. No intercostal retractions  LUNGS: Clear to auscultation upper lung fields with decreased breath sounds at the bases.  No stridor. No wheezes.  No rales.  CARDIOVASCULAR: Regular rate and rhythm, positive S1-S2, no murmurs rubs or gallops  appreciated ABDOMINAL: Obese, soft, nontender, nondistended, positive bowel sounds in all 4 quadrants, no apparent ascites.  EXTREMITIES: Warm to touch, trace pitting edema, palpable DP and PT pulses bilaterally.  HEMATOLOGIC: No significant bruising NEUROLOGIC: Oriented to person, place, and time. Nonfocal. Normal muscle tone.  PSYCHIATRIC: Normal mood and affect. Normal behavior. Cooperative   Lab Results: Hematology Recent Labs  Lab 02/01/21 1205 02/02/21 0345 02/02/21 0946 02/02/21 0951  WBC 8.1 10.1  --   --   RBC 3.50* 3.35*  --   --   HGB 11.0* 10.5* 10.2* 10.2*  HCT 34.9* 32.7* 30.0* 30.0*  MCV 99.7 97.6  --   --   MCH 31.4 31.3  --   --   MCHC 31.5 32.1  --   --   RDW 13.7 13.9  --   --   PLT 269 268  --   --     Chemistry Recent Labs  Lab 02/02/21 0345 02/02/21 0946 02/02/21 0951 02/03/21 0336 02/04/21 0426  NA 135   < > 137 137 138  K 4.9   < > 4.3 4.6 4.1  CL 101  --   --  103 104  CO2 20*  --   --  20* 21*  GLUCOSE 439*  --   --  316* 217*  BUN 25*  --   --  38* 53*  CREATININE 1.22*  --   --  1.18* 1.63*  CALCIUM 8.7*  --   --  9.1 9.0  PROT 7.2  --   --  6.8 6.9  ALBUMIN 3.2*  --   --  3.4* 3.3*  AST 47*  --   --  205* 157*  ALT 28  --   --  94* 112*  ALKPHOS 141*  --   --  186* 257*  BILITOT 0.6  --   --  0.7 0.3  GFRNONAA 48*  --   --  50* 34*  ANIONGAP 14  --   --  14 13   < > = values in this interval not displayed.     Cardiac Enzymes: Cardiac Panel (last 3 results) Recent Labs    02/01/21 2350 02/02/21 0345 02/02/21 0550  TROPONINIHS 1,347* 1,331* 1,302*    BNP (last 3 results) Recent Labs    02/01/21 1205  BNP 647.1*    ProBNP (last 3 results) No results for input(s): PROBNP in the last 8760 hours.   DDimer  Recent Labs  Lab 02/02/21 0345 02/03/21 0336 02/04/21 0426  DDIMER 0.76* 0.97* 1.98*     Hemoglobin A1c:  Lab Results  Component Value Date   HGBA1C 12.0 (H) 02/01/2021   MPG 297.7 02/01/2021    TSH   Recent Labs    02/01/21 1959  TSH 3.953    Lipid Panel     Component Value Date/Time   CHOL 140 02/02/2021 0345   TRIG 120 02/02/2021 0345   HDL 37 (L) 02/02/2021 0345   CHOLHDL 3.8 02/02/2021 0345   VLDL 24 02/02/2021 0345   LDLCALC 79 02/02/2021 0345    Imaging: CARDIAC CATHETERIZATION  Result Date: 02/02/2021   Mid Cx lesion is 90% stenosed.   Ost Cx to Prox Cx lesion is 30% stenosed.   1st Diag lesion is 99% stenosed.   Prox LAD to Mid LAD lesion is 100% stenosed.   A drug-eluting stent was successfully placed using a STENT ONYX FRONTIER 2.5X26.   A drug-eluting stent was successfully placed using a STENT ONYX FRONTIER [id number].   Post intervention, there is a 0% residual stenosis. Right and left Heart Catheterization 02/02/21: RA: 16/14, mean 14 mmHg RV 52/15, EDP 17 mmHg PA 50/22, mean 37 mmHg. PW 27/29, mean 24 mmHg. QP/QS 1.00.  CO 4.2, CI 2.02 by Fick. LV: 112/15, EDP 29 mmHg.  Ao: 97/60, mean 76 mmHg.  There was no pressure gradient across the aortic valve. LM: Mildly calcified but widely patent. LAD: Large vessel in the proximal segment, moderately calcified, occluded in the midsegment after the origin of a large diagonal 1 which has ostial 99% stenosis. CX: Large vessel, proximal segment has a 30% stenosis, large OM1 with a proximal 20 to 30% stenosis, AV groove has 90 to 95% stenosis in the proximal to mid segment. RCA: Large vessel, mild luminal irregularity. Interventional data: Successful PTCA and stenting of the mid LAD with implantation of a 2.5 x 26 mm Onyx frontier DES, proximally into the proximal LAD and into the distal LM 82.75 x 12 mm Onyx frontier stent deployed postdilated with 3.5 x 8 mm [place] balloon at 20 atm pressure for 30 seconds x 2.  Complete expansion of the stent into the left main.  No sidebranch jailing of D1 which has ostial 99% stenosis.  TIMI 0 to TIMI-3 flow at the end of the procedure.  110 mL contrast utilized. Recommendation: Continue aspirin  indefinitely and Brilinta for at least a period of 1 year.  Medical therapy for circumflex disease unless she has recurrence  of angina pectoris.  Medical therapy for D1 disease.    CARDIAC DATABASE: EKG: 02/01/2021: NSR, 72 bpm, LVH per voltage criteria,PRWP, subtle ST-T changes.   02/01/2021: NSR, 68 bpm, LVH per voltage criteria, poor R wave progression, suble ST elevation V3-V6 without reciprocal changes.   02/01/2021: NSR, 72 bpm, LVH voltage criteria, PR WP, suble ST elevation V3-V6 without reciprocal changes, similar to prior EKG.   Echocardiogram:  1. Left ventricular ejection fraction, by estimation, is 30 to 35%. The  left ventricle has moderately decreased function. The left ventricle  demonstrates regional wall motion abnormalities (see scoring  diagram/findings for description). There is mild  left ventricular hypertrophy. Left ventricular diastolic parameters are  consistent with Grade I diastolic dysfunction (impaired relaxation).  Elevated left ventricular end-diastolic pressure. The mid and distal  anterior wall, entire anterior septum,  entire apex, mid and distal inferior wall, and mid inferoseptal segment  are hypokinetic.   2. Right ventricular systolic function is normal. The right ventricular  size is normal. There is normal pulmonary artery systolic pressure. The  estimated right ventricular systolic pressure is 00.7 mmHg.   3. The mitral valve is degenerative. Mild mitral valve regurgitation. No  evidence of mitral stenosis.   4. The aortic valve was not well visualized. Aortic valve regurgitation  is not visualized. No aortic stenosis is present.   5. The inferior vena cava is dilated in size with <50% respiratory  variability, suggesting right atrial pressure of 15 mmHg.   Conclusion(s)/Recommendation(s): No left ventricular mural or apical  thrombus/thrombi.   Heart catheterization: 02/02/2021:   Mid Cx lesion is 90% stenosed.   Ost Cx to Prox Cx lesion is  30% stenosed.   1st Diag lesion is 99% stenosed.   Prox LAD to Mid LAD lesion is 100% stenosed.   A drug-eluting stent was successfully placed using a STENT ONYX FRONTIER 2.5X26.   A drug-eluting stent was successfully placed using a STENT ONYX FRONTIER [id number].   Post intervention, there is a 0% residual stenosis.   Right and left Heart Catheterization 02/02/21:   RA: 16/14, mean 14 mmHg RV 52/15, EDP 17 mmHg PA 50/22, mean 37 mmHg. PW 27/29, mean 24 mmHg. QP/QS 1.00.  CO 4.2, CI 2.02 by Fick.   LV: 112/15, EDP 29 mmHg.  Ao: 97/60, mean 76 mmHg.  There was no pressure gradient across the aortic valve. LM: Mildly calcified but widely patent. LAD: Large vessel in the proximal segment, moderately calcified, occluded in the midsegment after the origin of a large diagonal 1 which has ostial 99% stenosis. CX: Large vessel, proximal segment has a 30% stenosis, large OM1 with a proximal 20 to 30% stenosis, AV groove has 90 to 95% stenosis in the proximal to mid segment. RCA: Large vessel, mild luminal irregularity.   Interventional data: Successful PTCA and stenting of the mid LAD with implantation of a 2.5 x 26 mm Onyx frontier DES, proximally into the proximal LAD and into the distal LM 82.75 x 12 mm Onyx frontier stent deployed postdilated with 3.5 x 8 mm [place] balloon at 20 atm pressure for 30 seconds x 2.  Complete expansion of the stent into the left main.  No sidebranch jailing of D1 which has ostial 99% stenosis.  TIMI 0 to TIMI-3 flow at the end of the procedure.  110 mL contrast utilized.  Recommendation: Continue aspirin indefinitely and Brilinta for at least a period of 1 year.  Medical therapy for circumflex disease unless she has recurrence  of angina pectoris.  Medical therapy for D1 disease  Scheduled Meds:  aspirin EC  81 mg Oral Daily   atorvastatin  40 mg Oral Daily   bisoprolol  5 mg Oral Daily   chlorpheniramine-HYDROcodone  5 mL Oral Q12H   dapagliflozin propanediol  10 mg  Oral Daily   guaiFENesin  1,200 mg Oral BID   heparin  5,000 Units Subcutaneous Q8H   insulin aspart  0-15 Units Subcutaneous TID WC & HS   insulin aspart  3 Units Subcutaneous TID WC   insulin glargine-yfgn  15 Units Subcutaneous Daily   ipratropium-albuterol  3 mL Nebulization Q6H   levothyroxine  88 mcg Oral Q0600   methylPREDNISolone (SOLU-MEDROL) injection  40 mg Intravenous Daily   pantoprazole  40 mg Oral Daily   sodium chloride flush  3 mL Intravenous Q12H   sodium chloride flush  3 mL Intravenous Q12H   sodium chloride flush  3 mL Intravenous Q12H   ticagrelor  90 mg Oral BID   venlafaxine XR  150 mg Oral Q breakfast    Continuous Infusions:  sodium chloride     sodium chloride     remdesivir 100 mg in NS 100 mL 100 mg (02/03/21 1120)    PRN Meds: sodium chloride, sodium chloride, acetaminophen, dextrose, ipratropium-albuterol, morphine injection, nitroGLYCERIN, ondansetron (ZOFRAN) IV, oxybutynin, sodium chloride flush, sodium chloride flush, traZODone   IMPRESSION & RECOMMENDATIONS: Mallory Butler is a 71 y.o. Caucasian female whose past medical history and cardiac risk factors include: CAD s/p angioplasty/PCI, acute systolic and diastolic heart failure, ischemic cardiomyopathy, IDDM Type II, HTN, HLD, postmenopausal female.  Impression: Acute on chronic systolic and diastolic heart failure, stage C, NYHA class II/III. Established coronary artery disease status post angioplasty and stent LM/LAD without angina pectoris Acute kidney injury NSTEMI-on presentation COVID-19 infection-on presentation Ischemic cardiomyopathy. Insulin-dependent diabetes mellitus type 2, uncontrolled. Coronary artery calcification. Aortic atherosclerosis. Hypertension with chronic kidney disease stage III Diabetes with complications of CKD, hyperglycemia, and hyperlipidemia Obesity due to excess calories.  Plan: Presented as non-STEMI, LVEF reduced with regional wall motion abnormalities,  underwent left heart catheterization noted to have obstructive CAD and status post intervention to LM/LAD.  Continue dual antiplatelet therapy for 1 year and aspirin indefinitely.  Patient was on atorvastatin 80 mg p.o. nightly which has been reduced to 40 mg p.o. nightly given her LFTs.  Which will need to be monitored.  She does not endorse myalgias.  With regards to heart failure management patient was started on parenteral diuretics and initiated Farxiga yesterday.  Given the acute kidney injury likely due to diuresis and recent contrast exposure and soft blood pressures we will discontinue IV Lasix as her volume status is improving.  Her I's and O's do not appear to be accurate.  Continue Wilder Glade for now.  Recommend transitioning from lisinopril to [facility] prior to discharge if renal function allows, otherwise can be done as outpatient  Patient is educated on the importance of following up with cardiology once discharged uptitrate her GDMT and antianginal therapy.  Patient is also educated on the importance of improving glycemic control her last hemoglobin A1c is 12.0.  Will defer glycemic management to primary team.  Patient remains in isolation due to COVID-19 infection and currently getting medical therapy and supportive care per primary team.  Patient was examined after wearing appropriate PPE as part of today's visit.  Patient's questions and concerns were addressed to her satisfaction. She voices understanding of the instructions provided during  this encounter.   This note was created using a voice recognition software as a result there may be grammatical errors inadvertently enclosed that do not reflect the nature of this encounter. Every attempt is made to correct such errors.  Mechele Claude [facility]  Pager: [phone] Office: [phone] 02/04/2021, 9:04 AM

## 2021-02-04 NOTE — Progress Notes (Signed)
Inpatient Diabetes Program Recommendations  AACE/ADA: New Consensus Statement on Inpatient Glycemic Control (2015)  Target Ranges:  Prepandial:   less than 140 mg/dL      Peak postprandial:   less than 180 mg/dL (1-2 hours)      Critically ill patients:  140 - 180 mg/dL   Lab Results  Component Value Date   GLUCAP 239 (H) 02/04/2021   HGBA1C 12.0 (H) 02/01/2021    Review of Glycemic Control  Latest Reference Range & Units 02/02/21 21:35 02/03/21 08:03 02/03/21 12:58 02/03/21 16:00 02/03/21 22:54 02/04/21 07:29 02/04/21 11:34  Glucose-Capillary 70 - 99 mg/dL 944 (H) 967 (H) 591 (H) 342 (H) 351 (H) 182 (H) 239 (H)   Diabetes history: DM 2 Outpatient Diabetes medications:   Current orders for Inpatient glycemic control:  Novolog moderate tid with meals and HS Farxiga 10 mg daily Novolog 3 units tid with meals Semglee 15 units daily  Inpatient Diabetes Program Recommendations:    Please change bedtime scale to HS scale of 0-5 units at 2200.  Patient received Novolog 15 units last night at bedtime for blood sugar of 351 mg/dL.  This may be why fasting blood sugar is lower this morning.    Consider increasing Semglee to 25 units daily and change Novolog meal coverage to 6 units tid with meals.  Patient only eating one meal a day at home per diabetes coordinator discussion on 1/31.  May do better with basal/bolus regimen instead of pre-mixed insulin.   Thanks,  Beryl Meager, RN, BC-ADM Inpatient Diabetes Coordinator Pager [phone]  (8a-5p)

## 2021-02-04 NOTE — Evaluation (Signed)
Occupational Therapy Evaluation Patient Details Name: Mallory DessLinda C Butler MRN: [id number] DOB: 06/07/1950 Today's Date: 02/04/2021   History of Present Illness 71 y.o. female presenting to ED 1/29 with worsening dyspnea and cough x3 weeks. Negative home COVID test. COVID PCR (+) in ED. Patient also found to have NSTEMI s/p cardiac cath 1/30, and acute CHF exacerbation.   PMHx significant for DMII, DLD, HTN, IDDM and obesity.   Clinical Impression   PTA patient was living alone in a private residence and was grossly I with ADLs/IADLs without AD although she reports furniture walking in home. Patient currently functioning below baseline demonstrating observed ADLs with Min guard to supervision A with use of RW. Patient also limited by deficits listed below including decreased balance with increased risk of falls, decreased activity tolerance requiring frequent seated rest breaks, and generalized weakness and would benefit from continued acute OT services in prep for safe d/c home with supervision/assist from family/neighbors. Patient plans to have someone stay with her overnight for several days after d/c. OT will continue to follow acutely.        Recommendations for follow up therapy are one component of a multi-disciplinary discharge planning process, led by the attending physician.  Recommendations may be updated based on patient status, additional functional criteria and insurance authorization.   Follow Up Recommendations  Home health OT    Assistance Recommended at Discharge Frequent or constant Supervision/Assistance  Patient can return home with the following A little help with bathing/dressing/bathroom;Assistance with cooking/housework;Help with stairs or ramp for entrance    Functional Status Assessment  Patient has had a recent decline in their functional status and demonstrates the ability to make significant improvements in function in a reasonable and predictable amount of time.   Equipment Recommendations  None recommended by OT    Recommendations for Other Services       Precautions / Restrictions Precautions Precautions: Fall Restrictions Weight Bearing Restrictions: No      Mobility Bed Mobility Overal bed mobility: Modified Independent                  Transfers Overall transfer level: Needs assistance Equipment used: None Transfers: Sit to/from Stand Sit to Stand: Supervision           General transfer comment: Supervision A for sit to stand from EOB for safety.      Balance Overall balance assessment: Needs assistance Sitting-balance support: Feet supported Sitting balance-Leahy Scale: Good Sitting balance - Comments: No LOB when reaching outside of BOS.   Standing balance support: Bilateral upper extremity supported, Reliant on assistive device for balance Standing balance-Leahy Scale: Fair Standing balance comment: Unsteady without use of AD. Furniture walks at home. Would benefit from use of AD to decrease risk of falls.                           ADL either performed or assessed with clinical judgement   ADL Overall ADL's : Needs assistance/impaired Eating/Feeding: Independent   Grooming: Supervision/safety;Standing Grooming Details (indicate cue type and reason): 2/3 grooming tasks standing at sink level; quick to fatigue Upper Body Bathing: Supervision/ safety;Sitting   Lower Body Bathing: Min guard;Sit to/from stand   Upper Body Dressing : Supervision/safety;Sitting   Lower Body Dressing: Min guard;Sit to/from stand Lower Body Dressing Details (indicate cue type and reason): Able to don footwear seated EOB with set-up assist. Toilet Transfer: Min guard Toilet Transfer Details (indicate cue type and reason): Simulated  with transfer to sink. Min guard for steadying/safety. Patient furniture walks. Recommend use of RW.                 Vision Baseline Vision/History: 1 Wears glasses (when driving  or reading) Ability to See in Adequate Light: 1 Impaired (cataracts) Patient Visual Report: No change from baseline Vision Assessment?: No apparent visual deficits     Perception     Praxis      Pertinent Vitals/Pain Pain Assessment Pain Assessment: No/denies pain     Hand Dominance Left   Extremity/Trunk Assessment Upper Extremity Assessment Upper Extremity Assessment: Generalized weakness   Lower Extremity Assessment Lower Extremity Assessment: Generalized weakness   Cervical / Trunk Assessment Cervical / Trunk Assessment: Normal   Communication Communication Communication: No difficulties   Cognition Arousal/Alertness: Awake/alert Behavior During Therapy: WFL for tasks assessed/performed Overall Cognitive Status: Within Functional Limits for tasks assessed                                 General Comments: very pleasant and motivated to regain strength     General Comments  VSS on RA.    Exercises     Shoulder Instructions      Home Living Family/patient expects to be discharged to:: Private residence Living Arrangements: Alone Available Help at Discharge: Family;Available PRN/intermittently;Neighbor Type of Home: House Home Access: Stairs to enter [employer] of Steps: 6-7   Home Layout: One level     Bathroom Shower/Tub: Producer, television/film/video: Handicapped height     Home Equipment: Agricultural consultant (2 wheels);Shower seat;Cane - single point;Electric scooter          Prior Functioning/Environment Prior Level of Function : Independent/Modified Independent             Mobility Comments: Reports furniture walking in home PTA ADLs Comments: Independent with ADLs/IADLs; manages own meds; drives        OT Problem List: Decreased activity tolerance;Impaired balance (sitting and/or standing);Decreased safety awareness;Decreased knowledge of use of DME or AE;Obesity      OT Treatment/Interventions:  Self-care/ADL training;Energy conservation;DME and/or AE instruction;Therapeutic activities;Patient/family education;Balance training    OT Goals(Current goals can be found in the care plan section) Acute Rehab OT Goals Patient Stated Goal: To return home. OT Goal Formulation: With patient Time For Goal Achievement: 02/18/21 Potential to Achieve Goals: Good ADL Goals Pt Will Perform Grooming: with modified independence;standing Pt Will Perform Upper Body Bathing: with modified independence;sitting Pt Will Perform Lower Body Bathing: with modified independence;sit to/from stand Pt Will Perform Upper Body Dressing: with modified independence;sitting Pt Will Perform Lower Body Dressing: with modified independence;sit to/from stand Pt Will Transfer to Toilet: with modified independence;ambulating Pt Will Perform Toileting - Clothing Manipulation and hygiene: with modified independence;sit to/from stand Pt/caregiver will Perform Home Exercise Program: Increased strength;Both right and left upper extremity;With written HEP provided;Independently Additional ADL Goal #1: Patient will tolerate 10-15 minutes of therapeutic activity without need for rest break indicating improved activity tolerance.  OT Frequency: Min 2X/week    Co-evaluation              AM-PAC OT "6 Clicks" Daily Activity     Outcome Measure Help from another person eating meals?: None Help from another person taking care of personal grooming?: A Little Help from another person toileting, which includes using toliet, bedpan, or urinal?: A Little Help from another person bathing (including washing, rinsing,  drying)?: A Little Help from another person to put on and taking off regular upper body clothing?: A Little Help from another person to put on and taking off regular lower body clothing?: A Little 6 Click Score: 19   End of Session Equipment Utilized During Treatment: Rolling walker (2 wheels) Nurse Communication:  Mobility status  Activity Tolerance: Patient tolerated treatment well Patient left: in bed;Other (comment) (Seated EOB with RN present to administer meds)  OT Visit Diagnosis: Unsteadiness on feet (R26.81);Muscle weakness (generalized) (M62.81)                Time: 1962-2297 OT Time Calculation (min): 25 min Charges:  OT General Charges $OT Visit: 1 Visit OT Evaluation $OT Eval Low Complexity: 1 Low OT Treatments $Self Care/Home Management : 8-22 mins  Jerid Catherman H. OTR/L Supplemental OT, Department of rehab services [phone]  Aireana Ryland R H. 02/04/2021, 10:46 AM

## 2021-02-04 NOTE — Progress Notes (Signed)
PROGRESS NOTE    Mallory Butler  XVQ:[id number] DOB: 1950-07-10 DOA: 02/01/2021 PCP: Janie Morning, DO  Brief Narrative: 70/F with history of obesity, type 2 diabetes mellitus, dyslipidemia, hypertension was admitted with decompensated CHF, non-STEMI and COVID-19 infection. -Chest x-ray noted cardiomegaly and pulmonary vascular congestion, CT chest noted bilateral pleural effusions -Underwent left heart cath with stenting of proximal LAD and distal left main -Diuresed with IV Lasix -Also treated with remdesivir and IV steroids   Subjective: -Feels better overall, breathing improving, he also has some chronic diarrhea  Assessment & Plan:  *Acute systolic CHF -Echo with EF 30-35%, wall motion abnormalities -Diuresed with IV Lasix, appears close to euvolemic -Holding Lasix today with soft BP and AKI -Started on Farxiga -Titrate GDMT as tolerated, follow-up BMP  NSTEMI CAD -Underwent left heart cath 1/30, stenting of proximal LAD and distal left main -Continue aspirin, ticagrelor, bisoprolol, statin -DAPT for 1 year  Acute kidney injury -Creatinine has trended up to 1.6 from 1.1 -Likely secondary to diuretics, Etc. -Holding Lasix today  SARS COVID-19 infection -Clinically stable, no evidence of hypoxemia at this time -Continue remdesivir, cut down steroids to p.o. -Continue  isolation X 10 days  Hypothyroid- (present on admission) On levothyroxine   Class 3 obesity ([place])- (present on admission) Calculated BMI is 41.7   Essential hypertension- (present on admission) Continue bisoprolol.   Type 2 diabetes mellitus with hyperlipidemia ([place])- (present on admission) -CBGs trending up, increase Lantus   DVT prophylaxis: Heparin subcutaneous Code Status: full codeFull code Family Communication: No family at bedside Disposition Plan:    Consultants:  Cardiology  Procedures: Heart catheterization: 02/02/2021:   Mid Cx lesion is 90% stenosed.   Ost Cx to Prox Cx  lesion is 30% stenosed.   1st Diag lesion is 99% stenosed.   Prox LAD to Mid LAD lesion is 100% stenosed.   A drug-eluting stent was successfully placed using a STENT ONYX FRONTIER [id number].   A drug-eluting stent was successfully placed using a STENT ONYX FRONTIER [id number].   Post intervention, there is a 0% residual stenosis.   Right and left Heart Catheterization 02/02/21:   RA: 16/14, mean 14 mmHg RV 52/15, EDP 17 mmHg PA 50/22, mean 37 mmHg. PW 27/29, mean 24 mmHg. QP/QS 1.00.  CO 4.2, CI 2.02 by Fick.   LV: 112/15, EDP 29 mmHg.  Ao: 97/60, mean 76 mmHg.  There was no pressure gradient across the aortic valve. LM: Mildly calcified but widely patent. LAD: Large vessel in the proximal segment, moderately calcified, occluded in the midsegment after the origin of a large diagonal 1 which has ostial 99% stenosis. CX: Large vessel, proximal segment has a 30% stenosis, large OM1 with a proximal 20 to 30% stenosis, AV groove has 90 to 95% stenosis in the proximal to mid segment. RCA: Large vessel, mild luminal irregularity.   Interventional data: Successful PTCA and stenting of the mid LAD with implantation of a 2.5 x 26 mm Onyx frontier DES, proximally into the proximal LAD and into the distal LM 82.75 x 12 mm Onyx frontier stent deployed postdilated with 3.5 x 8 mm [place] balloon at 20 atm pressure for 30 seconds x 2.  Complete expansion of the stent into the left main.  No sidebranch jailing of D1 which has ostial 99% stenosis.  TIMI 0 to TIMI-3 flow at the end of the procedure.  110 mL contrast utilized.  Recommendation: Continue aspirin indefinitely and Brilinta for at least a period of 1 year.  Medical therapy for circumflex disease unless she has recurrence of angina pectoris.  Medical therapy for D1 disease  Antimicrobials:    Objective: Vitals:   02/03/21 2045 02/03/21 2100 02/04/21 0528 02/04/21 1138  BP:  91/61 110/67 108/76  Pulse:  66 66 (!) 58  Resp:   (!) 24 (!) 24  Temp:  98  F (36.7 C) 97.6 F (36.4 C) 98.5 F (36.9 C)  TempSrc:  Oral Oral Oral  SpO2: 93% 94% 98% 97%  Weight:   108.8 kg   Height:        Intake/Output Summary (Last 24 hours) at 02/04/2021 1521 Last data filed at 02/04/2021 0200 Gross per 24 hour  Intake --  Output 600 ml  Net -600 ml   Filed Weights   02/02/21 0005 02/03/21 0500 02/04/21 0528  Weight: 107 kg 108.9 kg 108.8 kg    Examination:  General exam: Appears calm and comfortable  Respiratory system: Poor air movement, few scattered rails Cardiovascular system: S1 & S2 heard, RRR.  Abd: nondistended, soft and nontender.Normal bowel sounds heard. Central nervous system: Alert and oriented. No focal neurological deficits. Extremities: no edema Skin: No rashes Psychiatry: Judgement and insight appear normal. Mood & affect appropriate.     Data Reviewed:   CBC: Recent Labs  Lab 02/01/21 1205 02/02/21 0345 02/02/21 0946 02/02/21 0951  WBC 8.1 10.1  --   --   NEUTROABS  --  6.9  --   --   HGB 11.0* 10.5* 10.2* 10.2*  HCT 34.9* 32.7* 30.0* 30.0*  MCV 99.7 97.6  --   --   PLT 269 268  --   --    Basic Metabolic Panel: Recent Labs  Lab 02/01/21 1205 02/01/21 1959 02/02/21 0345 02/02/21 0946 02/02/21 0951 02/03/21 0336 02/04/21 0426  NA 134* 134* 135 138 137 137 138  K 4.1 4.3 4.9 4.4 4.3 4.6 4.1  CL 102 102 101  --   --  103 104  CO2 19* 18* 20*  --   --  20* 21*  GLUCOSE 554* 309* 439*  --   --  316* 217*  BUN 19 21 25*  --   --  38* 53*  CREATININE 1.12* 1.22* 1.22*  --   --  1.18* 1.63*  CALCIUM 8.4* 8.7* 8.7*  --   --  9.1 9.0  MG  --   --  2.0  --   --   --   --   PHOS  --   --  4.6  --   --   --   --    GFR: Estimated Creatinine Clearance: 38 mL/min (A) (by C-G formula based on SCr of 1.63 mg/dL (H)). Liver Function Tests: Recent Labs  Lab 02/01/21 1205 02/02/21 0345 02/03/21 0336 02/04/21 0426  AST 24 47* 205* 157*  ALT 20 28 94* 112*  ALKPHOS 91 141* 186* 257*  BILITOT 0.4 0.6 0.7 0.3   PROT 6.7 7.2 6.8 6.9  ALBUMIN 2.8* 3.2* 3.4* 3.3*   No results for input(s): LIPASE, AMYLASE in the last 168 hours. No results for input(s): AMMONIA in the last 168 hours. Coagulation Profile: Recent Labs  Lab 02/01/21 1205  INR 1.0   Cardiac Enzymes: No results for input(s): CKTOTAL, CKMB, CKMBINDEX, TROPONINI in the last 168 hours. BNP (last 3 results) No results for input(s): PROBNP in the last 8760 hours. HbA1C: Recent Labs    02/01/21 1959  HGBA1C 12.0*   CBG: Recent Labs  Lab 02/03/21 1258 02/03/21 1600 02/03/21 2254 02/04/21 0729 02/04/21 1134  GLUCAP 274* 342* 351* 182* 239*   Lipid Profile: Recent Labs    02/02/21 0345  CHOL 140  HDL 37*  LDLCALC 79  TRIG 120  CHOLHDL 3.8   Thyroid Function Tests: Recent Labs    02/01/21 1959  TSH 3.953   Anemia Panel: Recent Labs    02/03/21 0336 02/04/21 0426  FERRITIN 1,905* 912*   Urine analysis: No results found for: COLORURINE, APPEARANCEUR, LABSPEC, PHURINE, GLUCOSEU, HGBUR, BILIRUBINUR, KETONESUR, PROTEINUR, UROBILINOGEN, NITRITE, LEUKOCYTESUR Sepsis Labs: _0 (procalcitonin:4,lacticidven:4)  ) Recent Results (from the past 240 hour(s))  Resp Panel by RT-PCR (Flu A&B, Covid) Nasopharyngeal Swab     Status: Abnormal   Collection Time: 02/01/21 12:35 PM   Specimen: Nasopharyngeal Swab; Nasopharyngeal(NP) swabs in vial transport medium  Result Value Ref Range Status   SARS Coronavirus 2 by RT PCR POSITIVE (A) NEGATIVE Final    Comment: (NOTE) SARS-CoV-2 target nucleic acids are DETECTED.  The SARS-CoV-2 RNA is generally detectable in upper respiratory specimens during the acute phase of infection. Positive results are indicative of the presence of the identified virus, but do not rule out bacterial infection or co-infection with other pathogens not detected by the test. Clinical correlation with patient history and other diagnostic information is necessary to determine patient infection  status. The expected result is Negative.  Fact Sheet for Patients: EntrepreneurPulse.com.au  Fact Sheet for Healthcare Providers: IncredibleEmployment.be  This test is not yet approved or cleared by the [place] FDA and  has been authorized for detection and/or diagnosis of SARS-CoV-2 by FDA under an Emergency Use Authorization (EUA).  This EUA will remain in effect (meaning this test can be used) for the duration of  the COVID-19 declaration under Section 564(b)(1) of the A ct, 21 U.S.C. section 360bbb-3(b)(1), unless the authorization is terminated or revoked sooner.     Influenza A by PCR NEGATIVE NEGATIVE Final   Influenza B by PCR NEGATIVE NEGATIVE Final    Comment: (NOTE) The Xpert Xpress SARS-CoV-2/FLU/RSV plus assay is intended as an aid in the diagnosis of influenza from Nasopharyngeal swab specimens and should not be used as a sole basis for treatment. Nasal washings and aspirates are unacceptable for Xpert Xpress SARS-CoV-2/FLU/RSV testing.  Fact Sheet for Patients: EntrepreneurPulse.com.au  Fact Sheet for Healthcare Providers: IncredibleEmployment.be  This test is not yet approved or cleared by the [place] FDA and has been authorized for detection and/or diagnosis of SARS-CoV-2 by FDA under an Emergency Use Authorization (EUA). This EUA will remain in effect (meaning this test can be used) for the duration of the COVID-19 declaration under Section 564(b)(1) of the Act, 21 U.S.C. section 360bbb-3(b)(1), unless the authorization is terminated or revoked.  Performed at [facility], [place] [address] [place], [place] [address]      Radiology Studies: No results found.   Scheduled Meds:  aspirin EC  81 mg Oral Daily   atorvastatin  40 mg Oral Daily   bisoprolol  5 mg Oral Daily   chlorpheniramine-HYDROcodone  5 mL Oral Q12H   dapagliflozin propanediol  10 mg Oral Daily    guaiFENesin  1,200 mg Oral BID   heparin  5,000 Units Subcutaneous Q8H   insulin aspart  0-15 Units Subcutaneous TID WC & HS   insulin aspart  3 Units Subcutaneous TID WC   insulin glargine-yfgn  15 Units Subcutaneous Daily   ipratropium-albuterol  3 mL Nebulization BID   levothyroxine  88 mcg Oral Q0600   methylPREDNISolone (SOLU-MEDROL) injection  40 mg Intravenous Daily   pantoprazole  40 mg Oral Daily   sodium chloride flush  3 mL Intravenous Q12H   sodium chloride flush  3 mL Intravenous Q12H   sodium chloride flush  3 mL Intravenous Q12H   ticagrelor  90 mg Oral BID   venlafaxine XR  150 mg Oral Q breakfast   Continuous Infusions:  sodium chloride     sodium chloride     remdesivir 100 mg in NS 100 mL 100 mg (02/04/21 1043)     LOS: 3 days    Time spent: 5mn    PDomenic Polite MD [facility]   02/04/2021, 3:21 PM

## 2021-02-05 LAB — COMPREHENSIVE METABOLIC PANEL
ALT: 126 U/L — ABNORMAL HIGH (ref 0–44)
AST: 124 U/L — ABNORMAL HIGH (ref 15–41)
Albumin: 3.2 g/dL — ABNORMAL LOW (ref 3.5–5.0)
Alkaline Phosphatase: 306 U/L — ABNORMAL HIGH (ref 38–126)
Anion gap: 13 (ref 5–15)
BUN: 57 mg/dL — ABNORMAL HIGH (ref 8–23)
CO2: 22 mmol/L (ref 22–32)
Calcium: 9.1 mg/dL (ref 8.9–10.3)
Chloride: 103 mmol/L (ref 98–111)
Creatinine, Ser: 1.5 mg/dL — ABNORMAL HIGH (ref 0.44–1.00)
GFR, Estimated: 37 mL/min — ABNORMAL LOW (ref >60)
Glucose, Bld: 256 mg/dL — ABNORMAL HIGH (ref 70–99)
Potassium: 4.2 mmol/L (ref 3.5–5.1)
Sodium: 138 mmol/L (ref 135–145)
Total Bilirubin: 0.2 mg/dL — ABNORMAL LOW (ref 0.3–1.2)
Total Protein: 7 g/dL (ref 6.5–8.1)

## 2021-02-05 LAB — GLUCOSE, CAPILLARY
Glucose-Capillary: 93 mg/dL (ref 70–99)
Glucose-Capillary: 199 mg/dL — ABNORMAL HIGH (ref 70–99)
Glucose-Capillary: 173 mg/dL — ABNORMAL HIGH (ref 70–99)
Glucose-Capillary: 204 mg/dL — ABNORMAL HIGH (ref 70–99)

## 2021-02-05 LAB — D-DIMER, QUANTITATIVE: D-Dimer, Quant: 2.8 ug/mL-FEU — ABNORMAL HIGH (ref 0.00–0.50)

## 2021-02-05 LAB — C-REACTIVE PROTEIN: CRP: 5 mg/dL — ABNORMAL HIGH (ref <1.0)

## 2021-02-05 LAB — FERRITIN: Ferritin: 498 ng/mL — ABNORMAL HIGH (ref 11–307)

## 2021-02-05 MED ORDER — INSULIN ASPART 100 UNIT/ML IJ SOLN
6.0000 [IU] | Freq: Three times a day (TID) | INTRAMUSCULAR | Status: DC
  Administered 2021-02-05 – 2021-02-06 (×4): 6 [IU] via SUBCUTANEOUS

## 2021-02-05 MED ORDER — INSULIN GLARGINE-YFGN 100 UNIT/ML ~~LOC~~ SOLN
25.0000 [IU] | Freq: Every day | SUBCUTANEOUS | Status: DC
  Administered 2021-02-05: 25 [IU] via SUBCUTANEOUS
  Filled 2021-02-05 (×2): qty 0.25

## 2021-02-05 NOTE — Progress Notes (Signed)
Physical Therapy Treatment Patient Details Name: Mallory Butler MRN: [id number] DOB: December 28, 1950 Today's Date: 02/05/2021   History of Present Illness 71 y.o. female presenting to ED 1/29 with worsening dyspnea and cough x3 weeks. Negative home COVID test. COVID PCR (+) in ED. Patient also found to have NSTEMI s/p cardiac cath 1/30, and acute CHF exacerbation.   PMHx significant for DMII, DLD, HTN, IDDM and obesity.    PT Comments    Pt pleasant and very eager to mobilize and return home. Pt able to walk limited distance in room due to fatigue and complete stairs. Pt required 3L with activity particularly stairs due to desaturation. Pt educated for HEP and encouraged to work toward progressive ambulation at home as well as walk to bathroom acutely with nursing staff.     Recommendations for follow up therapy are one component of a multi-disciplinary discharge planning process, led by the attending physician.  Recommendations may be updated based on patient status, additional functional criteria and insurance authorization.  Follow Up Recommendations  Home health PT     Assistance Recommended at Discharge Intermittent Supervision/Assistance  Patient can return home with the following A little help with walking and/or transfers;A little help with bathing/dressing/bathroom;Assistance with cooking/housework;Help with stairs or ramp for entrance   Equipment Recommendations  None recommended by PT    Recommendations for Other Services       Precautions / Restrictions Precautions Precautions: Fall;Other (comment) Precaution Comments: Covid, incontinence, watch sats     Mobility  Bed Mobility Overal bed mobility: Modified Independent             General bed mobility comments: HOB 25 degrees with pt exiting to right with increased time    Transfers Overall transfer level: Modified independent                 General transfer comment: pt able to stand from bed, toilet and  chair with assist for lines    Ambulation/Gait Ambulation/Gait assistance: Min guard Gait Distance (Feet): 20 Feet Assistive device: Rolling walker (2 wheels) Gait Pattern/deviations: Step-through pattern, Decreased stride length, Trunk flexed   Gait velocity interpretation: 1.31 - 2.62 ft/sec, indicative of limited community ambulator   General Gait Details: pt walked from bed to and from bathroom with need for seated rest between trials due to fatigue.   Stairs Stairs: Yes Stairs assistance: Supervision Stair Management: Step to pattern, Forwards, Two rails Number of Stairs: 4 General stair comments: Pt able to ascend stairs safely with reliance on rail with desaturation to 87% and requiring seated rest and increased O2 to 3L for recovery   Wheelchair Mobility    Modified Rankin (Stroke Patients Only)       Balance Overall balance assessment: Needs assistance   Sitting balance-Leahy Scale: Good Sitting balance - Comments: EOB and toilet without assist   Standing balance support: Bilateral upper extremity supported, Reliant on assistive device for balance Standing balance-Leahy Scale: Poor Standing balance comment: Rw for gait                            Cognition Arousal/Alertness: Awake/alert Behavior During Therapy: WFL for tasks assessed/performed Overall Cognitive Status: Within Functional Limits for tasks assessed                                          Exercises  General Exercises - Lower Extremity Long Arc Quad: AROM, Both, Seated, 20 reps Hip Flexion/Marching: AROM, Both, Seated, 20 reps    General Comments        Pertinent Vitals/Pain Pain Assessment Pain Assessment: No/denies pain    Home Living                          Prior Function            PT Goals (current goals can now be found in the care plan section) Progress towards PT goals: Progressing toward goals    Frequency    Min  3X/week      PT Plan Current plan remains appropriate    Co-evaluation              AM-PAC PT "6 Clicks" Mobility   Outcome Measure  Help needed turning from your back to your side while in a flat bed without using bedrails?: A Little Help needed moving from lying on your back to sitting on the side of a flat bed without using bedrails?: A Little Help needed moving to and from a bed to a chair (including a wheelchair)?: A Little Help needed standing up from a chair using your arms (e.g., wheelchair or bedside chair)?: A Little Help needed to walk in hospital room?: A Little Help needed climbing 3-5 steps with a railing? : A Little 6 Click Score: 18    End of Session Equipment Utilized During Treatment: Oxygen Activity Tolerance: Patient tolerated treatment well Patient left: in chair;with call bell/phone within reach Nurse Communication: Mobility status PT Visit Diagnosis: Muscle weakness (generalized) (M62.81);Difficulty in walking, not elsewhere classified (R26.2);Other abnormalities of gait and mobility (R26.89)     Time: 9604-5409 PT Time Calculation (min) (ACUTE ONLY): 32 min  Charges:  $Gait Training: 8-22 mins $Therapeutic Activity: 8-22 mins                     Chadd Tollison P, PT Acute Rehabilitation Services Pager: [phone] Office: [phone]    Roselynne Lortz B Tyneshia Stivers 02/05/2021, 11:20 AM

## 2021-02-05 NOTE — Progress Notes (Signed)
Blood sugars remain elevated. Night time sugar was 366. Administered long-acting and correction insulin but patient was initially hesitant to receive. Is unsure why her sugars are high and why she is getting multiple/increased insulins. Attempted educating patient. Also explained that elevated sugars can come from things like steroids and can be temporary. Encouraged patient to discuss with the team during rounds.

## 2021-02-05 NOTE — Evaluation (Signed)
Occupational Therapy Evaluation Patient Details Name: Mallory Butler MRN: [id number] DOB: 11/13/1950 Today's Date: 02/05/2021   History of Present Illness 71 y.o. female presenting to ED 1/29 with worsening dyspnea and cough x3 weeks. Negative home COVID test. COVID PCR (+) in ED. Patient also found to have NSTEMI s/p cardiac cath 1/30, and acute CHF exacerbation.   PMHx significant for DMII, DLD, HTN, IDDM and obesity.   Clinical Impression    OT treatment session with focus on self-care re-education, functional transfers and education on energy conservation. Patient completed 2/3 grooming tasks in standing/sitting at sink level with supervision A and cues for safety with RW. Patient then completed toilet transfer to standard height commode in bathroom with use of RW and cues for safe hand placement. Patient making good progress toward goals. D/c recommendations remain appropriate. Will continue plan of care.      Recommendations for follow up therapy are one component of a multi-disciplinary discharge planning process, led by the attending physician.  Recommendations may be updated based on patient status, additional functional criteria and insurance authorization.   Follow Up Recommendations  Home health OT    Assistance Recommended at Discharge Frequent or constant Supervision/Assistance  Patient can return home with the following A little help with bathing/dressing/bathroom;Assistance with cooking/housework;Help with stairs or ramp for entrance    Functional Status Assessment     Equipment Recommendations  None recommended by OT    Recommendations for Other Services       Precautions / Restrictions Precautions Precautions: Fall;Other (comment) Precaution Comments: Covid, incontinence, watch sats      Mobility Bed Mobility Overal bed mobility: Modified Independent                  Transfers Overall transfer level: Needs assistance Equipment used: Rolling walker (2  wheels) Transfers: Sit to/from Stand, Bed to chair/wheelchair/BSC Sit to Stand: Modified independent (Device/Increase time)           General transfer comment: Cues for safety with RW and for safe hand placement.      Balance Overall balance assessment: Needs assistance Sitting-balance support: Feet supported Sitting balance-Leahy Scale: Good                                     ADL either performed or assessed with clinical judgement   ADL Overall ADL's : Needs assistance/impaired     Grooming: Set up;Sitting;Standing Grooming Details (indicate cue type and reason): 2/3 grooming tasks in sitting/standing at sink level.                 Toilet Transfer: Supervision/safety;Rolling walker (2 wheels) Toilet Transfer Details (indicate cue type and reason): To standard height commode in bathroom with RW. Cues for safe hand placement. Toileting- Clothing Manipulation and Hygiene: Supervision/safety;Sit to/from stand Toileting - Clothing Manipulation Details (indicate cue type and reason): Able to adjust brief in standing with supervision A for safety.       General ADL Comments: Continues to be limited by decreased activity tolerance.     Vision         Perception     Praxis      Pertinent Vitals/Pain Pain Assessment Pain Assessment: No/denies pain     Hand Dominance     Extremity/Trunk Assessment             Communication     Cognition Arousal/Alertness: Awake/alert Behavior During Therapy: [facility]  for tasks assessed/performed Overall Cognitive Status: Within Functional Limits for tasks assessed                                       General Comments       Exercises     Shoulder Instructions      Home Living                                          Prior Functioning/Environment                          OT Problem List:        OT Treatment/Interventions:      OT Goals(Current  goals can be found in the care plan section) Acute Rehab OT Goals Patient Stated Goal: To return home. OT Goal Formulation: With patient Time For Goal Achievement: 02/18/21 Potential to Achieve Goals: Good ADL Goals Pt Will Perform Grooming: with modified independence;standing Pt Will Perform Upper Body Bathing: with modified independence;sitting Pt Will Perform Lower Body Bathing: with modified independence;sit to/from stand Pt Will Perform Upper Body Dressing: with modified independence;sitting Pt Will Perform Lower Body Dressing: with modified independence;sit to/from stand Pt Will Transfer to Toilet: with modified independence;ambulating Pt Will Perform Toileting - Clothing Manipulation and hygiene: with modified independence;sit to/from stand Pt/caregiver will Perform Home Exercise Program: Increased strength;Both right and left upper extremity;With written HEP provided;Independently Additional ADL Goal #1: Patient will tolerate 10-15 minutes of therapeutic activity without need for rest break indicating improved activity tolerance.  OT Frequency: Min 2X/week    Co-evaluation              AM-PAC OT "6 Clicks" Daily Activity     Outcome Measure Help from another person eating meals?: None Help from another person taking care of personal grooming?: A Little Help from another person toileting, which includes using toliet, bedpan, or urinal?: A Little Help from another person bathing (including washing, rinsing, drying)?: A Little Help from another person to put on and taking off regular upper body clothing?: A Little Help from another person to put on and taking off regular lower body clothing?: A Little 6 Click Score: 19   End of Session Equipment Utilized During Treatment: Rolling walker (2 wheels) Nurse Communication: Mobility status  Activity Tolerance: Patient tolerated treatment well Patient left: in bed;with call bell/phone within reach;with bed alarm set  OT Visit  Diagnosis: Unsteadiness on feet (R26.81);Muscle weakness (generalized) (M62.81)                Time: 3762-8315 OT Time Calculation (min): 24 min Charges:  OT General Charges $OT Visit: 1 Visit OT Treatments $Self Care/Home Management : 8-22 mins $Therapeutic Activity: 8-22 mins  Layney Gillson H. OTR/L Supplemental OT, Department of rehab services [phone]  Lakyra Tippins R H. 02/05/2021, 1:29 PM

## 2021-02-05 NOTE — Progress Notes (Signed)
PROGRESS NOTE    Mallory Butler  EXB:[id number] DOB: December 14, 1950 DOA: 02/01/2021 PCP: Irena Reichmann, DO  Brief Narrative:70/F with history of obesity, type 2 diabetes mellitus, dyslipidemia, hypertension was admitted with decompensated CHF, non-STEMI and COVID-19 infection. -Chest x-ray noted cardiomegaly and pulmonary vascular congestion, CT chest noted bilateral pleural effusions -Underwent left heart cath with stenting of proximal LAD and distal left main -Diuresed with IV Lasix -Also treated with remdesivir and IV steroids  Subjective: -Feels better, breathing is improving  Assessment & Plan: Assessment and Plan: * Acute CHF (congestive heart failure) (HCC) Echo with EF 30-35%, wall motion abnormalities -Diuresed with IV Lasix, appears close to euvolemic -Held Lasix yesterday with soft BP and AKI -Started on Farxiga -Kidney function is stable, restart p.o. Lasix tomorrow, further GDMT likely after discharge -Discharge planning, wean off O2  NSTEMI (non-ST elevated myocardial infarction) (HCC)- (present on admission) CAD -Underwent left heart cath 1/30, stenting of proximal LAD and distal left main -Continue aspirin, ticagrelor, bisoprolol, statin -DAPT for 1 year  COVID-19 virus infection- (present on admission) Clinically stable, minimal hypoxemia which is multifactorial, wean O2 -Continue remdesivir, cut down steroids to p.o.-we will taper this -Continue  isolation X 10 days   AKI (acute kidney injury) (HCC)- (present on admission) - Mild bump in creatinine yesterday, diuretics held -Improving, restart Lasix tomorrow, further titrate GDMT as tolerated  Type 2 diabetes mellitus with hyperlipidemia (HCC)- (present on admission) CBGs poorly controlled while on steroids, increase Semglee and meal coverage insulin  Hypothyroid- (present on admission) On levothyroxine   Class 3 obesity (HCC)- (present on admission) Calculated BMI is 41.7   Essential hypertension-  (present on admission) Continue blood pressure control with bisoprolol.    DVT prophylaxis: Heparin subcutaneous Code Status: Full code Family Communication: No family at bedside Disposition Plan:    Consultants:  cards  Procedures:   Antimicrobials:    Objective: Vitals:   02/05/21 0315 02/05/21 0327 02/05/21 0715 02/05/21 0933  BP: 126/85  108/68   Pulse: 64 68 64   Resp: 18 18 (!) 21   Temp: 98 F (36.7 C)     TempSrc: Oral     SpO2: 98% 98% 97% 98%  Weight:  106.4 kg    Height:        Intake/Output Summary (Last 24 hours) at 02/05/2021 1416 Last data filed at 02/04/2021 2054 Gross per 24 hour  Intake 179.89 ml  Output 1300 ml  Net -1120.11 ml   Filed Weights   02/03/21 0500 02/04/21 0528 02/05/21 0327  Weight: 108.9 kg 108.8 kg 106.4 kg    Examination:  General exam: Obese pleasant female sitting up in bed, AAOx3, no distress HEENT: Neck obese unable to assess JVD CVS: S1-S2, regular rate rhythm Abdomen: Soft, nontender, obese, bowel sounds present Extremities: No edema  Skin: No rashes Psychiatry:  Mood & affect appropriate.     Data Reviewed:   CBC: Recent Labs  Lab 02/01/21 1205 02/02/21 0345 02/02/21 0946 02/02/21 0951  WBC 8.1 10.1  --   --   NEUTROABS  --  6.9  --   --   HGB 11.0* 10.5* 10.2* 10.2*  HCT 34.9* 32.7* 30.0* 30.0*  MCV 99.7 97.6  --   --   PLT 269 268  --   --    Basic Metabolic Panel: Recent Labs  Lab 02/01/21 1959 02/02/21 0345 02/02/21 0946 02/02/21 0951 02/03/21 0336 02/04/21 0426 02/05/21 0104  NA 134* 135 138 137 137 138 138  K 4.3 4.9 4.4 4.3 4.6 4.1 4.2  CL 102 101  --   --  103 104 103  CO2 18* 20*  --   --  20* 21* 22  GLUCOSE 309* 439*  --   --  316* 217* 256*  BUN 21 25*  --   --  38* 53* 57*  CREATININE 1.22* 1.22*  --   --  1.18* 1.63* 1.50*  CALCIUM 8.7* 8.7*  --   --  9.1 9.0 9.1  MG  --  2.0  --   --   --   --   --   PHOS  --  4.6  --   --   --   --   --    GFR: Estimated Creatinine  Clearance: 40.8 mL/min (A) (by C-G formula based on SCr of 1.5 mg/dL (H)). Liver Function Tests: Recent Labs  Lab 02/01/21 1205 02/02/21 0345 02/03/21 0336 02/04/21 0426 02/05/21 0104  AST 24 47* 205* 157* 124*  ALT 20 28 94* 112* 126*  ALKPHOS 91 141* 186* 257* 306*  BILITOT 0.4 0.6 0.7 0.3 0.2*  PROT 6.7 7.2 6.8 6.9 7.0  ALBUMIN 2.8* 3.2* 3.4* 3.3* 3.2*   No results for input(s): LIPASE, AMYLASE in the last 168 hours. No results for input(s): AMMONIA in the last 168 hours. Coagulation Profile: Recent Labs  Lab 02/01/21 1205  INR 1.0   Cardiac Enzymes: No results for input(s): CKTOTAL, CKMB, CKMBINDEX, TROPONINI in the last 168 hours. BNP (last 3 results) No results for input(s): PROBNP in the last 8760 hours. HbA1C: No results for input(s): HGBA1C in the last 72 hours. CBG: Recent Labs  Lab 02/04/21 1134 02/04/21 1612 02/04/21 2047 02/05/21 0737 02/05/21 1152  GLUCAP 239* 389* 366* 93 199*   Lipid Profile: No results for input(s): CHOL, HDL, LDLCALC, TRIG, CHOLHDL, LDLDIRECT in the last 72 hours. Thyroid Function Tests: No results for input(s): TSH, T4TOTAL, FREET4, T3FREE, THYROIDAB in the last 72 hours. Anemia Panel: Recent Labs    02/04/21 0426 02/05/21 0104  FERRITIN 912* 498*   Urine analysis: No results found for: COLORURINE, APPEARANCEUR, LABSPEC, PHURINE, GLUCOSEU, HGBUR, BILIRUBINUR, KETONESUR, PROTEINUR, UROBILINOGEN, NITRITE, LEUKOCYTESUR Sepsis Labs: @LABRCNTIP (procalcitonin:4,lacticidven:4)  ) Recent Results (from the past 240 hour(s))  Resp Panel by RT-PCR (Flu A&B, Covid) Nasopharyngeal Swab     Status: Abnormal   Collection Time: 02/01/21 12:35 PM   Specimen: Nasopharyngeal Swab; Nasopharyngeal(NP) swabs in vial transport medium  Result Value Ref Range Status   SARS Coronavirus 2 by RT PCR POSITIVE (A) NEGATIVE Final    Comment: (NOTE) SARS-CoV-2 target nucleic acids are DETECTED.  The SARS-CoV-2 RNA is generally detectable in upper  respiratory specimens during the acute phase of infection. Positive results are indicative of the presence of the identified virus, but do not rule out bacterial infection or co-infection with other pathogens not detected by the test. Clinical correlation with patient history and other diagnostic information is necessary to determine patient infection status. The expected result is Negative.  Fact Sheet for Patients: 02/03/21  Fact Sheet for Healthcare Providers: BloggerCourse.com  This test is not yet approved or cleared by the [place] FDA and  has been authorized for detection and/or diagnosis of SARS-CoV-2 by FDA under an Emergency Use Authorization (EUA).  This EUA will remain in effect (meaning this test can be used) for the duration of  the COVID-19 declaration under Section 564(b)(1) of the A ct, 21 U.S.C. section 360bbb-3(b)(1), unless the authorization is  terminated or revoked sooner.     Influenza A by PCR NEGATIVE NEGATIVE Final   Influenza B by PCR NEGATIVE NEGATIVE Final    Comment: (NOTE) The Xpert Xpress SARS-CoV-2/FLU/RSV plus assay is intended as an aid in the diagnosis of influenza from Nasopharyngeal swab specimens and should not be used as a sole basis for treatment. Nasal washings and aspirates are unacceptable for Xpert Xpress SARS-CoV-2/FLU/RSV testing.  Fact Sheet for Patients: BloggerCourse.comhttps://www.fda.gov/media/152166/download  Fact Sheet for Healthcare Providers: SeriousBroker.ithttps://www.fda.gov/media/152162/download  This test is not yet approved or cleared by the [place] FDA and has been authorized for detection and/or diagnosis of SARS-CoV-2 by FDA under an Emergency Use Authorization (EUA). This EUA will remain in effect (meaning this test can be used) for the duration of the COVID-19 declaration under Section 564(b)(1) of the Act, 21 U.S.C. section 360bbb-3(b)(1), unless the authorization is  terminated or revoked.  Performed at [facility], [address] [place], [place] [address]      Radiology Studies: No results found.   Scheduled Meds:  aspirin EC  81 mg Oral Daily   atorvastatin  40 mg Oral Daily   bisoprolol  5 mg Oral Daily   chlorpheniramine-HYDROcodone  5 mL Oral Q12H   dapagliflozin propanediol  10 mg Oral Daily   dexamethasone  6 mg Oral Daily   guaiFENesin  1,200 mg Oral BID   heparin  5,000 Units Subcutaneous Q8H   insulin aspart  0-15 Units Subcutaneous TID WC & HS   insulin aspart  6 Units Subcutaneous TID WC   insulin glargine-yfgn  25 Units Subcutaneous Daily   ipratropium-albuterol  3 mL Nebulization BID   levothyroxine  88 mcg Oral Q0600   pantoprazole  40 mg Oral Daily   sodium chloride flush  3 mL Intravenous Q12H   sodium chloride flush  3 mL Intravenous Q12H   sodium chloride flush  3 mL Intravenous Q12H   ticagrelor  90 mg Oral BID   venlafaxine XR  150 mg Oral Q breakfast   Continuous Infusions:  sodium chloride     sodium chloride       LOS: 4 days    Time spent: 30min  Zannie CovePreetha Eva Griffo, MD [facility]   02/05/2021, 2:16 PM

## 2021-02-05 NOTE — Progress Notes (Signed)
Progress Note  Patient Name: Mallory Butler Date of Encounter: 02/05/2021  Attending physician: Domenic Polite, MD Primary care provider: Janie Morning, DO   Subjective: Mallory Butler is a 71 y.o. female who was seen and examined at bedside  Remains in COVID-19 isolation. Denies angina pectoris. Shortness of breath improving. Denies lower extremity swelling, orthopnea, paroxysmal nocturnal dyspnea. Plan of care discussed with nursing staff.  Objective: Vital Signs in the last 24 hours: Temp:  [97.5 F (36.4 C)-98 F (36.7 C)] 97.6 F (36.4 C) (02/02 2029) Pulse Rate:  [61-68] 63 (02/02 2029) Resp:  [18-21] 18 (02/02 2029) BP: (108-126)/(68-85) 123/83 (02/02 2029) SpO2:  [93 %-100 %] 100 % (02/02 2143) Weight:  [106.4 kg] 106.4 kg (02/02 0327)  Intake/Output: No intake or output data in the 24 hours ending 02/05/21 2307   Net IO Since Admission: -1,451.94 mL [02/05/21 2307]  Weights:  Filed Weights   02/03/21 0500 02/04/21 0528 02/05/21 0327  Weight: 108.9 kg 108.8 kg 106.4 kg    Telemetry: Personally reviewed-NSR without any significant dysrhythmia  Physical examination: PHYSICAL EXAM: Vitals with BMI 02/05/2021 02/05/2021 02/05/2021  Height - - -  Weight - - -  BMI - - -  Systolic 144 315 400  Diastolic 83 70 78  Pulse 63 61 -     CONSTITUTIONAL: Appears older than stated age, obese, no acute distress.  SKIN: Skin is warm and dry. No rash noted. No cyanosis. No pallor. No jaundice NECK: JVP difficult to evaluate due to short neck stature and adipose tissue.  No thyromegaly noted. No carotid bruits  CHEST Normal respiratory effort. No intercostal retractions  LUNGS: Clear to auscultation upper lung fields with decreased breath sounds at the bases.  No stridor. No wheezes.  No rales.  CARDIOVASCULAR: Regular rate and rhythm, positive S1-S2, no murmurs rubs or gallops appreciated ABDOMINAL: Obese, soft, nontender, nondistended, positive bowel sounds in all 4 quadrants,  no apparent ascites.  EXTREMITIES: Warm to touch, trace pitting edema, palpable DP and PT pulses bilaterally.  HEMATOLOGIC: No significant bruising NEUROLOGIC: Oriented to person, place, and time. Nonfocal. Normal muscle tone.  PSYCHIATRIC: Normal mood and affect. Normal behavior. Cooperative   Lab Results: Hematology Recent Labs  Lab 02/01/21 1205 02/02/21 0345 02/02/21 0946 02/02/21 0951  WBC 8.1 10.1  --   --   RBC 3.50* 3.35*  --   --   HGB 11.0* 10.5* 10.2* 10.2*  HCT 34.9* 32.7* 30.0* 30.0*  MCV 99.7 97.6  --   --   MCH 31.4 31.3  --   --   MCHC 31.5 32.1  --   --   RDW 13.7 13.9  --   --   PLT 269 268  --   --     Chemistry Recent Labs  Lab 02/03/21 0336 02/04/21 0426 02/05/21 0104  NA 137 138 138  K 4.6 4.1 4.2  CL 103 104 103  CO2 20* 21* 22  GLUCOSE 316* 217* 256*  BUN 38* 53* 57*  CREATININE 1.18* 1.63* 1.50*  CALCIUM 9.1 9.0 9.1  PROT 6.8 6.9 7.0  ALBUMIN 3.4* 3.3* 3.2*  AST 205* 157* 124*  ALT 94* 112* 126*  ALKPHOS 186* 257* 306*  BILITOT 0.7 0.3 0.2*  GFRNONAA 50* 34* 37*  ANIONGAP '14 13 13     ' Cardiac Enzymes: Cardiac Panel (last 3 results) No results for input(s): CKTOTAL, CKMB, TROPONINIHS, RELINDX in the last 72 hours.   BNP (last 3 results) Recent Labs  02/01/21 1205  BNP 647.1*    ProBNP (last 3 results) No results for input(s): PROBNP in the last 8760 hours.   DDimer  Recent Labs  Lab 02/03/21 0336 02/04/21 0426 02/05/21 0104  DDIMER 0.97* 1.98* 2.80*     Hemoglobin A1c:  Lab Results  Component Value Date   HGBA1C 12.0 (H) 02/01/2021   MPG 297.7 02/01/2021    TSH  Recent Labs    02/01/21 1959  TSH 3.953    Lipid Panel     Component Value Date/Time   CHOL 140 02/02/2021 0345   TRIG 120 02/02/2021 0345   HDL 37 (L) 02/02/2021 0345   CHOLHDL 3.8 02/02/2021 0345   VLDL 24 02/02/2021 0345   LDLCALC 79 02/02/2021 0345    Imaging: No results found.  CARDIAC DATABASE: EKG: 02/01/2021: NSR, 72 bpm,  LVH per voltage criteria,PRWP, subtle ST-T changes.   02/01/2021: NSR, 68 bpm, LVH per voltage criteria, poor R wave progression, suble ST elevation V3-V6 without reciprocal changes.   02/01/2021: NSR, 72 bpm, LVH voltage criteria, PR WP, suble ST elevation V3-V6 without reciprocal changes, similar to prior EKG.   Echocardiogram:  1. Left ventricular ejection fraction, by estimation, is 30 to 35%. The  left ventricle has moderately decreased function. The left ventricle  demonstrates regional wall motion abnormalities (see scoring  diagram/findings for description). There is mild  left ventricular hypertrophy. Left ventricular diastolic parameters are  consistent with Grade I diastolic dysfunction (impaired relaxation).  Elevated left ventricular end-diastolic pressure. The mid and distal  anterior wall, entire anterior septum,  entire apex, mid and distal inferior wall, and mid inferoseptal segment  are hypokinetic.   2. Right ventricular systolic function is normal. The right ventricular  size is normal. There is normal pulmonary artery systolic pressure. The  estimated right ventricular systolic pressure is 00.1 mmHg.   3. The mitral valve is degenerative. Mild mitral valve regurgitation. No  evidence of mitral stenosis.   4. The aortic valve was not well visualized. Aortic valve regurgitation  is not visualized. No aortic stenosis is present.   5. The inferior vena cava is dilated in size with <50% respiratory  variability, suggesting right atrial pressure of 15 mmHg.   Conclusion(s)/Recommendation(s): No left ventricular mural or apical  thrombus/thrombi.   Heart catheterization: 02/02/2021:   Mid Cx lesion is 90% stenosed.   Ost Cx to Prox Cx lesion is 30% stenosed.   1st Diag lesion is 99% stenosed.   Prox LAD to Mid LAD lesion is 100% stenosed.   A drug-eluting stent was successfully placed using a STENT ONYX FRONTIER [id number].   A drug-eluting stent was successfully placed  using a STENT ONYX FRONTIER [id number].   Post intervention, there is a 0% residual stenosis.   Right and left Heart Catheterization 02/02/21:   RA: 16/14, mean 14 mmHg RV 52/15, EDP 17 mmHg PA 50/22, mean 37 mmHg. PW 27/29, mean 24 mmHg. QP/QS 1.00.  CO 4.2, CI 2.02 by Fick.   LV: 112/15, EDP 29 mmHg.  Ao: 97/60, mean 76 mmHg.  There was no pressure gradient across the aortic valve. LM: Mildly calcified but widely patent. LAD: Large vessel in the proximal segment, moderately calcified, occluded in the midsegment after the origin of a large diagonal 1 which has ostial 99% stenosis. CX: Large vessel, proximal segment has a 30% stenosis, large OM1 with a proximal 20 to 30% stenosis, AV groove has 90 to 95% stenosis in the proximal to mid segment. RCA:  Large vessel, mild luminal irregularity.   Interventional data: Successful PTCA and stenting of the mid LAD with implantation of a 2.5 x 26 mm Onyx frontier DES, proximally into the proximal LAD and into the distal LM 82.75 x 12 mm Onyx frontier stent deployed postdilated with 3.5 x 8 mm  balloon at 20 atm pressure for 30 seconds x 2.  Complete expansion of the stent into the left main.  No sidebranch jailing of D1 which has ostial 99% stenosis.  TIMI 0 to TIMI-3 flow at the end of the procedure.  110 mL contrast utilized.  Recommendation: Continue aspirin indefinitely and Brilinta for at least a period of 1 year.  Medical therapy for circumflex disease unless she has recurrence of angina pectoris.  Medical therapy for D1 disease  Scheduled Meds:  aspirin EC  81 mg Oral Daily   atorvastatin  40 mg Oral Daily   bisoprolol  5 mg Oral Daily   chlorpheniramine-HYDROcodone  5 mL Oral Q12H   dapagliflozin propanediol  10 mg Oral Daily   dexamethasone  6 mg Oral Daily   guaiFENesin  1,200 mg Oral BID   heparin  5,000 Units Subcutaneous Q8H   insulin aspart  0-15 Units Subcutaneous TID WC & HS   insulin aspart  6 Units Subcutaneous TID WC   insulin  glargine-yfgn  25 Units Subcutaneous Daily   ipratropium-albuterol  3 mL Nebulization BID   levothyroxine  88 mcg Oral Q0600   pantoprazole  40 mg Oral Daily   sodium chloride flush  3 mL Intravenous Q12H   ticagrelor  90 mg Oral BID   venlafaxine XR  150 mg Oral Q breakfast    Continuous Infusions:  sodium chloride      PRN Meds: sodium chloride, acetaminophen, dextrose, ipratropium-albuterol, morphine injection, nitroGLYCERIN, ondansetron (ZOFRAN) IV, oxybutynin, sodium chloride flush, traZODone   IMPRESSION & RECOMMENDATIONS: Mallory Butler is a 71 y.o. Caucasian female whose past medical history and cardiac risk factors include: CAD s/p angioplasty/PCI, acute systolic and diastolic heart failure, ischemic cardiomyopathy, IDDM Type II, HTN, HLD, postmenopausal female.  Impression: Acute on chronic systolic and diastolic heart failure, stage C, NYHA class II/III -improving Established coronary artery disease status post angioplasty and stent LM/LAD without angina pectoris -stable Acute kidney injury -improving NSTEMI-on presentation COVID-19 infection-on presentation Ischemic cardiomyopathy. Insulin-dependent diabetes mellitus type 2, uncontrolled. Coronary artery calcification. Aortic atherosclerosis. Hypertension with chronic kidney disease stage III Diabetes with complications of CKD, hyperglycemia, and hyperlipidemia Obesity due to excess calories.  Plan: Acute on chronic systolic and diastolic heart failure, stage C, NYHA class II/III Improving Continue Farxiga. Unable to uptitrate GDMT given acute kidney injury.  Morning creatinine improving. If renal function continues to improve start losartan 25 mg p.o. every afternoon and spironolactone 25 mg p.o. every morning. Will plan to transition losartan to [facility] as outpatient. Strict I's and O's, daily weights, educated importance of low-salt diet  Ischemic cardiomyopathy: See above  Established coronary artery  disease s/p angioplasty and stent left main/LAD: Performed in the setting of non-STEMI therefore recommending dual antiplatelet therapy for 1 year Currently free of angina pectoris Continue statin 40 mg p.o. nightly, dose reduced secondary to increased LFTs (likely due to COVID-19) Educated on importance of glycemic control-most recent hemoglobin A1c 12.0 Cardiac rehab post discharge  Acute kidney injury on chronic kidney disease with HFrEF: Renal function improving Avoid nephrotoxic agents If renal function continues to improve will uptitrate GDMT as discussed above 02/06/2021  COVID-19 infection: Remains  in isolation. Patient was examined after wearing appropriate PPE's during morning rounds. Management per primary team.  Patient's questions and concerns were addressed to her satisfaction. She voices understanding of the instructions provided during this encounter.   This note was created using a voice recognition software as a result there may be grammatical errors inadvertently enclosed that do not reflect the nature of this encounter. Every attempt is made to correct such errors.  Total time spent: 25 minutes  Mechele [facility]  Pager: [phone] Office: [phone] 02/05/2021, 11:07 PM

## 2021-02-06 LAB — GLUCOSE, CAPILLARY
Glucose-Capillary: 169 mg/dL — ABNORMAL HIGH (ref 70–99)
Glucose-Capillary: 237 mg/dL — ABNORMAL HIGH (ref 70–99)
Glucose-Capillary: 250 mg/dL — ABNORMAL HIGH (ref 70–99)
Glucose-Capillary: 228 mg/dL — ABNORMAL HIGH (ref 70–99)

## 2021-02-06 LAB — COMPREHENSIVE METABOLIC PANEL
ALT: 153 U/L — ABNORMAL HIGH (ref 0–44)
AST: 126 U/L — ABNORMAL HIGH (ref 15–41)
Albumin: 3.3 g/dL — ABNORMAL LOW (ref 3.5–5.0)
Alkaline Phosphatase: 303 U/L — ABNORMAL HIGH (ref 38–126)
Anion gap: 15 (ref 5–15)
BUN: 49 mg/dL — ABNORMAL HIGH (ref 8–23)
CO2: 21 mmol/L — ABNORMAL LOW (ref 22–32)
Calcium: 9 mg/dL (ref 8.9–10.3)
Chloride: 105 mmol/L (ref 98–111)
Creatinine, Ser: 1.41 mg/dL — ABNORMAL HIGH (ref 0.44–1.00)
GFR, Estimated: 40 mL/min — ABNORMAL LOW (ref >60)
Glucose, Bld: 149 mg/dL — ABNORMAL HIGH (ref 70–99)
Potassium: 4.1 mmol/L (ref 3.5–5.1)
Sodium: 141 mmol/L (ref 135–145)
Total Bilirubin: 0.5 mg/dL (ref 0.3–1.2)
Total Protein: 7.1 g/dL (ref 6.5–8.1)

## 2021-02-06 LAB — FERRITIN: Ferritin: 338 ng/mL — ABNORMAL HIGH (ref 11–307)

## 2021-02-06 MED ORDER — POLYETHYLENE GLYCOL 3350 17 G PO PACK
17.0000 g | PACK | Freq: Every day | ORAL | Status: DC
  Administered 2021-02-06: 17 g via ORAL
  Filled 2021-02-06: qty 1

## 2021-02-06 MED ORDER — DAPAGLIFLOZIN PROPANEDIOL 10 MG PO TABS: 10.0000 mg | ORAL_TABLET | Freq: Every day | ORAL | 0 refills | Status: DC

## 2021-02-06 MED ORDER — TICAGRELOR 90 MG PO TABS: 90.0000 mg | ORAL_TABLET | Freq: Two times a day (BID) | ORAL | 0 refills | Status: DC

## 2021-02-06 MED ORDER — SPIRONOLACTONE 25 MG PO TABS: 12.5000 mg | ORAL_TABLET | Freq: Every day | ORAL | 0 refills | Status: DC

## 2021-02-06 MED ORDER — LOSARTAN POTASSIUM 25 MG PO TABS: 12.5000 mg | ORAL_TABLET | Freq: Every day | ORAL | 0 refills | Status: DC

## 2021-02-06 MED ORDER — NOVOLOG MIX 70/30 FLEXPEN (70-30) 100 UNIT/ML ~~LOC~~ SUPN: 35.0000 [IU] | PEN_INJECTOR | Freq: Two times a day (BID) | SUBCUTANEOUS | Status: DC

## 2021-02-06 NOTE — TOC Progression Note (Addendum)
Transition of Care [facility]) - Progression Note    Patient Details  Name: Mallory Butler MRN: [id number] Date of Birth: 1950-12-04  [facility] CM/SW Contact  Lockie Pares, RN Phone Number: 02/06/2021, 9:32 AM  Clinical Narrative:     Spoke with patient regarding home needs and home Health. Patient states she has a walker, shower stool, and electric scooter. She denies any further need for DME. Is interested in a agency for home health that works best with her insurance. She states her son is going to stay with her in [place]. .Verified address. Son can transport home when DC. If still on oxygen will need  Qualifiers , Animator.  1400 patient on room air Sat over 90% when working with PT, no oxygen needed  HH - sent to Amy at [place]- declined Angie at [facility]- no PT available Faxed information to [facility] home [facility]. Awaiting for confirmation   Expected Discharge Plan: Home w Home Health Services Barriers to Discharge: Continued Medical Work up  Expected Discharge Plan and Services Expected Discharge Plan: Home w Home Health Services   Discharge Planning Services: CM Consult Post Acute Care Choice: Home Health Living arrangements for the past 2 months: Single Family Home                           HH Arranged: PT, OT [facility] Agency: [facility] Date [facility] Agency Contacted: 02/06/21 Time HH Agency Contacted: 0930 Representative spoke with at [facility] Agency: Amy   Social Determinants of Health (SDOH) Interventions    Readmission Risk Interventions No flowsheet data found.

## 2021-02-06 NOTE — Care Management Important Message (Signed)
Important Message  Patient Details  Name: Mallory Butler MRN: [id number] Date of Birth: 03/26/1950   Medicare Important Message Given:  Yes     Shelda Altes 02/06/2021, 9:07 AM

## 2021-02-06 NOTE — Progress Notes (Signed)
Physical Therapy Treatment Patient Details Name: Mallory Butler MRN: [id number] DOB: December 02, 1950 Today's Date: 02/06/2021   History of Present Illness 71 y.o. female presenting to ED 1/29 with worsening dyspnea and cough x3 weeks. Negative home COVID test. COVID PCR (+) in ED. Patient also found to have NSTEMI s/p cardiac cath 1/30, and acute CHF exacerbation.   PMHx significant for DMII, DLD, HTN, IDDM and obesity.    PT Comments    Pt pleasant and eager to return home. Pt with increased gait tolerance without RW and on RA throughout session. Pt educated for progressive walking program, energy conservation and recommendation for home pulse oximeter to continue to monitor sats and function. Pt motivated and reports plans for increased activity at home.   93-98% on RA HR 65-74    Recommendations for follow up therapy are one component of a multi-disciplinary discharge planning process, led by the attending physician.  Recommendations may be updated based on patient status, additional functional criteria and insurance authorization.  Follow Up Recommendations  Home health PT     Assistance Recommended at Discharge Intermittent Supervision/Assistance  Patient can return home with the following A little help with walking and/or transfers;A little help with bathing/dressing/bathroom;Assistance with cooking/housework;Help with stairs or ramp for entrance   Equipment Recommendations  None recommended by PT    Recommendations for Other Services       Precautions / Restrictions Precautions Precautions: Fall;Other (comment) Precaution Comments: Covid, incontinence, watch sats     Mobility  Bed Mobility               General bed mobility comments: EOB on arrival and end of session    Transfers Overall transfer level: Modified independent                 General transfer comment: mod I with assist only for lines from bed, toilet and chair     Ambulation/Gait Ambulation/Gait assistance: Min guard Gait Distance (Feet): 45 Feet Assistive device: None Gait Pattern/deviations: Step-through pattern, Decreased stride length   Gait velocity interpretation: 1.31 - 2.62 ft/sec, indicative of limited community ambulator   General Gait Details: pt completed multiple gait trials. 15', 15', 35, 45, 30' respectively without RW and seated rest between each trial with cues for breathing technique and awareness of energy conservation with pt able to maintain sats >92% on RA with gait   Stairs             Wheelchair Mobility    Modified Rankin (Stroke Patients Only)       Balance Overall balance assessment: Mild deficits observed, not formally tested   Sitting balance-Leahy Scale: Good Sitting balance - Comments: EOB and toilet without assist   Standing balance support: No upper extremity supported Standing balance-Leahy Scale: Good Standing balance comment: walking without assist                            Cognition Arousal/Alertness: Awake/alert Behavior During Therapy: WFL for tasks assessed/performed Overall Cognitive Status: Within Functional Limits for tasks assessed                                          Exercises      General Comments        Pertinent Vitals/Pain Pain Assessment Pain Assessment: No/denies pain    Home Living  Prior Function            PT Goals (current goals can now be found in the care plan section) Progress towards PT goals: Progressing toward goals    Frequency    Min 3X/week      PT Plan Current plan remains appropriate    Co-evaluation              AM-PAC PT "6 Clicks" Mobility   Outcome Measure  Help needed turning from your back to your side while in a flat bed without using bedrails?: A Little Help needed moving from lying on your back to sitting on the side of a flat bed without using  bedrails?: A Little Help needed moving to and from a bed to a chair (including a wheelchair)?: A Little Help needed standing up from a chair using your arms (e.g., wheelchair or bedside chair)?: A Little Help needed to walk in hospital room?: A Little Help needed climbing 3-5 steps with a railing? : A Little 6 Click Score: 18    End of Session   Activity Tolerance: Patient tolerated treatment well Patient left: with call bell/phone within reach;in bed Nurse Communication: Mobility status PT Visit Diagnosis: Muscle weakness (generalized) (M62.81);Difficulty in walking, not elsewhere classified (R26.2);Other abnormalities of gait and mobility (R26.89)     Time: 5176-1607 PT Time Calculation (min) (ACUTE ONLY): 31 min  Charges:  $Gait Training: 23-37 mins                     Merryl Hacker, PT Acute Rehabilitation Services Pager: [phone] Office: [phone]    Mallory Butler 02/06/2021, 12:11 PM

## 2021-02-06 NOTE — Plan of Care (Signed)

## 2021-02-06 NOTE — Discharge Summary (Signed)
Physician Discharge Summary  Mallory Butler HYQ:[id number] DOB: 31-Jul-1950 DOA: 02/01/2021  PCP: Janie Morning, DO  Admit date: 02/01/2021 Discharge date: 02/06/2021  Time spent: 35 minutes  Recommendations for Outpatient Follow-up:  Cardiology Dr.Tolia 3/3 PCP in 1 week, please check BMP at follow-up    Discharge Diagnoses:  Principal Problem:   Acute CHF (congestive heart failure) (HCC) Acute systolic CHF   NSTEMI (non-ST elevated myocardial infarction) ([place])   COVID-19 virus infection   AKI (acute kidney injury) ([place])   Type 2 diabetes mellitus with hyperlipidemia ([place])   Essential hypertension   Class 3 obesity ([place])   Hypothyroid   Atherosclerosis of native coronary artery of native heart without angina pectoris   S/P primary angioplasty with coronary stent   Ischemic cardiomyopathy   Acute combined systolic and diastolic heart failure ([place])   Calcification of native coronary artery   Atherosclerosis of aorta (HCC)   Benign hypertension with CKD (chronic kidney disease) stage III (HCC)   Class 3 severe obesity due to excess calories with serious comorbidity and body mass index (BMI) of 40.0 to 44.9 in adult [facility])   Discharge Condition:   Diet recommendation: Diabetic, low-sodium, heart healthy  Filed Weights   02/04/21 0528 02/05/21 0327 02/06/21 0532  Weight: 108.8 kg 106.4 kg 105.6 kg    History of present illness:  71/F with history of obesity, type 2 diabetes mellitus, dyslipidemia, hypertension was admitted with decompensated CHF, non-STEMI and COVID-19 infection. -Chest x-ray noted cardiomegaly and pulmonary vascular congestion, CT chest noted bilateral pleural effusions  Hospital Course:   * Acute CHF (congestive heart failure) (HCC) Echo with EF 30-35%, wall motion abnormalities -Diuresed with IV Lasix, appears close to euvolemic -Diuretics subsequently held in the setting of AKI and soft BP -Started on Farxiga, discussed with cardiology today, creatinine  improving, volume status is stable, will discharge on Aldactone 12.5 mg daily and losartan 12.5 mg daily -Further titration of GDMT at follow-up  NSTEMI (non-ST elevated myocardial infarction) ([place])- (present on admission) CAD -Underwent left heart cath 1/30, stenting of proximal LAD and distal left main -Continue aspirin, ticagrelor, bisoprolol, statin -DAPT for 1 year  COVID-19 virus infection- (present on admission) Clinically stable, minimal hypoxemia which is multifactorial -Completed course of IV remdesivir and steroids, weaned off oxygen -Continue  isolation X 10 days total  AKI (acute kidney injury) ([place])- (present on admission) - Mild bump in creatinine to 1.6, 2 days ago -Creatinine improving, down to 1.4 today, will restart low-dose Aldactone and losartan, see discussion above, needs BMP check in 1 week  Type 2 diabetes mellitus with hyperlipidemia ([place])- (present on admission) - Insulin 70/30 resumed, also on Glucotrol and metformin, started on Farxiga this admission  Hypothyroid- (present on admission) On levothyroxine   Class 3 obesity ([place])- (present on admission) Calculated BMI is 41.7   Essential hypertension- (present on admission) Continue blood pressure control with bisoprolol.         Procedures: Heart catheterization: 02/02/2021:   Mid Cx lesion is 90% stenosed.   Ost Cx to Prox Cx lesion is 30% stenosed.   1st Diag lesion is 99% stenosed.   Prox LAD to Mid LAD lesion is 100% stenosed.   A drug-eluting stent was successfully placed using a STENT ONYX FRONTIER 2.5X26.   A drug-eluting stent was successfully placed using a STENT ONYX FRONTIER [id number].   Post intervention, there is a 0% residual stenosis.   Right and left Heart Catheterization 02/02/21:   RA: 16/14, mean 14  mmHg RV 52/15, EDP 17 mmHg PA 50/22, mean 37 mmHg. PW 27/29, mean 24 mmHg. QP/QS 1.00.  CO 4.2, CI 2.02 by Fick.   LV: 112/15, EDP 29 mmHg.  Ao: 97/60, mean 76 mmHg.  There  was no pressure gradient across the aortic valve. LM: Mildly calcified but widely patent. LAD: Large vessel in the proximal segment, moderately calcified, occluded in the midsegment after the origin of a large diagonal 1 which has ostial 99% stenosis. CX: Large vessel, proximal segment has a 30% stenosis, large OM1 with a proximal 20 to 30% stenosis, AV groove has 90 to 95% stenosis in the proximal to mid segment. RCA: Large vessel, mild luminal irregularity.   Interventional data: Successful PTCA and stenting of the mid LAD with implantation of a 2.5 x 26 mm Onyx frontier DES, proximally into the proximal LAD and into the distal LM 82.75 x 12 mm Onyx frontier stent deployed postdilated with 3.5 x 8 mm [place] balloon at 20 atm pressure for 30 seconds x 2.  Complete expansion of the stent into the left main.  No sidebranch jailing of D1 which has ostial 99% stenosis.  TIMI 0 to TIMI-3 flow at the end of the procedure.  110 mL contrast utilized.  Recommendation: Continue aspirin indefinitely and Brilinta for at least a period of 1 year.  Medical therapy for circumflex disease unless she has recurrence of angina pectoris.  Medical therapy for D1 disease    Consultations: Cardiology  Discharge Exam: Vitals:   02/06/21 1203 02/06/21 1350  BP:  117/78  Pulse: 74   Resp:    Temp:  (!) 97.5 F (36.4 C)  SpO2: 98%   General exam: Obese pleasant female sitting up in bed, AAOx3, no distress HEENT: Neck obese unable to assess JVD CVS: S1-S2, regular rate rhythm Abdomen: Soft, nontender, obese, bowel sounds present Extremities: No edema  Skin: No rashes Psychiatry:  Mood & affect appropriate.     Discharge Instructions   Discharge Instructions     Amb Referral to Cardiac Rehabilitation   Complete by: As directed    Will send Cardiac Rehab Phase 2 order to [facility]   Diagnosis:  NSTEMI Coronary Stents     After initial evaluation and assessments completed: Virtual Based Care may be provided  alone or in conjunction with Phase 2 Cardiac Rehab based on patient barriers.: Yes   Diet - low sodium heart healthy   Complete by: As directed    Diet Carb Modified   Complete by: As directed    Increase activity slowly   Complete by: As directed       Allergies as of 02/06/2021   No Known Allergies      Medication List     STOP taking these medications    lisinopril 20 MG tablet Commonly known as: ZESTRIL       TAKE these medications    aspirin EC 81 MG tablet Take 1 tablet by mouth daily.   bisoprolol 10 MG tablet Commonly known as: ZEBETA Take 10 mg by mouth daily.   dapagliflozin propanediol 10 MG Tabs tablet Commonly known as: FARXIGA Take 1 tablet (10 mg total) by mouth daily. Start taking on: February 07, 2021   glipiZIDE 10 MG tablet Commonly known as: GLUCOTROL Take 10 mg by mouth 2 (two) times daily before a meal.   levothyroxine 88 MCG tablet Commonly known as: SYNTHROID Take 88 mcg by mouth daily before breakfast.   losartan 25 MG tablet Commonly known  as: Cozaar Take 0.5 tablets (12.5 mg total) by mouth daily.   metFORMIN 1000 MG tablet Commonly known as: GLUCOPHAGE Take 1 tablet by mouth 2 (two) times a day.   Myrbetriq 50 MG Tb24 tablet Generic drug: mirabegron ER Take 50 mg by mouth daily.   NovoLOG Mix 70/30 FlexPen (70-30) 100 UNIT/ML FlexPen Generic drug: insulin aspart protamine - aspart Inject 35 Units into the skin 2 (two) times daily with a meal. 45units in morning and 30-35units in evening What changed:  how to take this additional instructions   OneTouch Ultra test strip Generic drug: glucose blood USE TO TEST 3 TIMES DAILY   oxybutynin 5 MG tablet Commonly known as: DITROPAN Take 5 mg by mouth in the morning, at noon, and at bedtime. TAKE 1 TABLET BY MOUTH TWICE A DAY FOR BLADDER   simvastatin 80 MG tablet Commonly known as: ZOCOR Take 1 tablet by mouth daily.   spironolactone 25 MG tablet Commonly known as:  Aldactone Take 0.5 tablets (12.5 mg total) by mouth daily.   ticagrelor 90 MG Tabs tablet Commonly known as: BRILINTA Take 1 tablet (90 mg total) by mouth 2 (two) times daily.   venlafaxine XR 150 MG 24 hr capsule Commonly known as: EFFEXOR-XR 150 mg daily with breakfast.       No Known Allergies  Follow-up Information     Tolia, Sunit, DO Follow up on 03/06/2021.   Specialties: Cardiology, Vascular Surgery Why: 130pm Contact information: [address] [place] [address] [phone]                  The results of significant diagnostics from this hospitalization (including imaging, microbiology, ancillary and laboratory) are listed below for reference.    Significant Diagnostic Studies: DG Chest 2 View  Result Date: 02/01/2021 CLINICAL DATA:  Cough, shortness of breath. EXAM: CHEST - 2 VIEW COMPARISON:  None. FINDINGS: The heart size and mediastinal contours are within normal limits. Possible minimal bibasilar subsegmental atelectasis or edema is noted with probable small pleural effusions. The visualized skeletal structures are unremarkable. IMPRESSION: Possible minimal bibasilar subsegmental atelectasis or edema with probable small pleural effusions. Electronically Signed   By: Marijo Conception M.D.   On: 02/01/2021 14:25   CT Angio Chest PE W and/or Wo Contrast  Result Date: 02/01/2021 CLINICAL DATA:  Positive D-dimer level. EXAM: CT ANGIOGRAPHY CHEST WITH CONTRAST TECHNIQUE: Multidetector CT imaging of the chest was performed using the standard protocol during bolus administration of intravenous contrast. Multiplanar CT image reconstructions and MIPs were obtained to evaluate the vascular anatomy. RADIATION DOSE REDUCTION: This exam was performed according to the departmental dose-optimization program which includes automated exposure control, adjustment of the mA and/or kV according to patient size and/or use of iterative reconstruction technique.  CONTRAST:  56m OMNIPAQUE IOHEXOL 350 MG/ML SOLN COMPARISON:  None. FINDINGS: Cardiovascular: Satisfactory opacification of the pulmonary arteries to the segmental level. No evidence of pulmonary embolism. Normal heart size. No pericardial effusion. Coronary artery calcifications are noted. Mediastinum/Nodes: Thyroid gland and esophagus are unremarkable. Mildly enlarged mediastinal lymph nodes are noted, largest being subcarinal lymph node measuring 13 mm, which most likely are reactive or inflammatory in etiology. Lungs/Pleura: No pneumothorax is noted. Mild to moderate bilateral pleural effusions are noted. Probable bilateral pulmonary edema is noted. Upper Abdomen: No acute abnormality. Musculoskeletal: No chest wall abnormality. No acute or significant osseous findings. Review of the MIP images confirms the above findings. IMPRESSION: No definite evidence of pulmonary  embolus. Bilateral pulmonary edema is noted with mild to moderate bilateral pleural effusions. Coronary artery calcifications are noted suggesting coronary artery disease. Mildly enlarged mediastinal adenopathy is noted which most likely is reactive or inflammatory in etiology. Aortic Atherosclerosis (ICD10-I70.0). Electronically Signed   By: Marijo Conception M.D.   On: 02/01/2021 15:53   CARDIAC CATHETERIZATION  Result Date: 02/02/2021   Mid Cx lesion is 90% stenosed.   Ost Cx to Prox Cx lesion is 30% stenosed.   1st Diag lesion is 99% stenosed.   Prox LAD to Mid LAD lesion is 100% stenosed.   A drug-eluting stent was successfully placed using a STENT ONYX FRONTIER 2.5X26.   A drug-eluting stent was successfully placed using a STENT ONYX FRONTIER [id number].   Post intervention, there is a 0% residual stenosis. Right and left Heart Catheterization 02/02/21: RA: 16/14, mean 14 mmHg RV 52/15, EDP 17 mmHg PA 50/22, mean 37 mmHg. PW 27/29, mean 24 mmHg. QP/QS 1.00.  CO 4.2, CI 2.02 by Fick. LV: 112/15, EDP 29 mmHg.  Ao: 97/60, mean 76 mmHg.  There was  no pressure gradient across the aortic valve. LM: Mildly calcified but widely patent. LAD: Large vessel in the proximal segment, moderately calcified, occluded in the midsegment after the origin of a large diagonal 1 which has ostial 99% stenosis. CX: Large vessel, proximal segment has a 30% stenosis, large OM1 with a proximal 20 to 30% stenosis, AV groove has 90 to 95% stenosis in the proximal to mid segment. RCA: Large vessel, mild luminal irregularity. Interventional data: Successful PTCA and stenting of the mid LAD with implantation of a 2.5 x 26 mm Onyx frontier DES, proximally into the proximal LAD and into the distal LM 82.75 x 12 mm Onyx frontier stent deployed postdilated with 3.5 x 8 mm [place] balloon at 20 atm pressure for 30 seconds x 2.  Complete expansion of the stent into the left main.  No sidebranch jailing of D1 which has ostial 99% stenosis.  TIMI 0 to TIMI-3 flow at the end of the procedure.  110 mL contrast utilized. Recommendation: Continue aspirin indefinitely and Brilinta for at least a period of 1 year.  Medical therapy for circumflex disease unless she has recurrence of angina pectoris.  Medical therapy for D1 disease.   ECHOCARDIOGRAM LIMITED  Result Date: 02/01/2021    ECHOCARDIOGRAM REPORT   Patient Name:   Mallory Butler Date of Exam: 02/01/2021 Medical Rec #:  [id number]     Height:       63.0 in Accession #:    [id number]    Weight:       230.0 lb Date of Birth:  [birth year]     BSA:          2.052 m Patient Age:    66 years      BP:           88/63 mmHg Patient Gender: F             HR:           76 bpm. Exam Location:  Inpatient Procedure: 2D Echo, Limited Color Doppler and Cardiac Doppler Indications:    NSTEMI  History:        Patient has no prior history of Echocardiogram examinations.                 Covid; Risk Factors:Diabetes, Hypertension and Sleep Apnea.  Sonographer:    Johny Chess RDCS Referring Phys: [id number] Rex Kras  Sonographer Comments: Patient is morbidly obese.  Could not remain still. IMPRESSIONS  1. Left ventricular ejection fraction, by estimation, is 30 to 35%. The left ventricle has moderately decreased function. The left ventricle demonstrates regional wall motion abnormalities (see scoring diagram/findings for description). There is mild left ventricular hypertrophy. Left ventricular diastolic parameters are consistent with Grade I diastolic dysfunction (impaired relaxation). Elevated left ventricular end-diastolic pressure. The mid and distal anterior wall, entire anterior septum, entire apex, mid and distal inferior wall, and mid inferoseptal segment are hypokinetic.  2. Right ventricular systolic function is normal. The right ventricular size is normal. There is normal pulmonary artery systolic pressure. The estimated right ventricular systolic pressure is 81.8 mmHg.  3. The mitral valve is degenerative. Mild mitral valve regurgitation. No evidence of mitral stenosis.  4. The aortic valve was not well visualized. Aortic valve regurgitation is not visualized. No aortic stenosis is present.  5. The inferior vena cava is dilated in size with <50% respiratory variability, suggesting right atrial pressure of 15 mmHg. Conclusion(s)/Recommendation(s): No left ventricular mural or apical thrombus/thrombi. FINDINGS  Left Ventricle: The mid and distal anterior wall, entire anterior septum, entire apex, mid and distal inferior wall, and mid inferoseptal segment are hypokinetic. Left ventricular ejection fraction, by estimation, is 30 to 35%. The left ventricle has moderately decreased function. The left ventricle demonstrates regional wall motion abnormalities. Definity contrast agent was given IV to delineate the left ventricular endocardial borders. The left ventricular internal cavity size was normal in size. There is mild left ventricular hypertrophy. Left ventricular diastolic parameters are consistent with Grade I diastolic dysfunction (impaired relaxation). Elevated  left ventricular end-diastolic pressure.  LV Wall Scoring: The mid and distal anterior wall, entire anterior septum, entire apex, mid and distal inferior wall, and mid inferoseptal segment are hypokinetic. Right Ventricle: The right ventricular size is normal. No increase in right ventricular wall thickness. Right ventricular systolic function is normal. There is normal pulmonary artery systolic pressure. The tricuspid regurgitant velocity is 2.13 m/s, and  with an assumed right atrial pressure of 8 mmHg, the estimated right ventricular systolic pressure is 29.9 mmHg. Left Atrium: Left atrial size was normal in size. Right Atrium: Right atrial size was normal in size. Pericardium: There is no evidence of pericardial effusion. Mitral Valve: The mitral valve is degenerative in appearance. There is mild thickening of the mitral valve leaflet(s). There is mild calcification of the mitral valve leaflet(s). Mildly decreased mobility of the mitral valve leaflets. Mild mitral valve regurgitation. No evidence of mitral valve stenosis. Tricuspid Valve: The tricuspid valve is normal in structure. Tricuspid valve regurgitation is mild . No evidence of tricuspid stenosis. Aortic Valve: The aortic valve was not well visualized. Aortic valve regurgitation is not visualized. No aortic stenosis is present. Pulmonic Valve: The pulmonic valve was not well visualized. Pulmonic valve regurgitation is not visualized. No evidence of pulmonic stenosis. Aorta: The aortic root and ascending aorta are structurally normal, with no evidence of dilitation. Venous: The inferior vena cava is dilated in size with less than 50% respiratory variability, suggesting right atrial pressure of 15 mmHg. IAS/Shunts: No atrial level shunt detected by color flow Doppler.  LEFT VENTRICLE PLAX 2D LVIDd:         5.70 cm      Diastology LVIDs:         4.00 cm      LV e' medial:    5.11 cm/s LV PW:         1.50  cm      LV E/e' medial:  21.3 LV IVS:        1.10 cm       LV e' lateral:   5.98 cm/s LVOT diam:     1.90 cm      LV E/e' lateral: 18.2 LVOT Area:     2.84 cm  LV Volumes (MOD) LV vol d, MOD A2C: 150.0 ml LV vol d, MOD A4C: 129.0 ml LV vol s, MOD A2C: 118.0 ml LV vol s, MOD A4C: 96.7 ml LV SV MOD A2C:     32.0 ml LV SV MOD A4C:     129.0 ml LV SV MOD BP:      32.5 ml IVC IVC diam: 2.60 cm LEFT ATRIUM         Index       RIGHT ATRIUM           Index Mallory diam:    5.20 cm 2.53 cm/m  RA Area:     14.20 cm                                 RA Volume:   35.50 ml  17.30 ml/m   AORTA Ao Asc diam: 3.10 cm MITRAL VALVE                TRICUSPID VALVE MV Area (PHT): 3.85 cm     TR Peak grad:   18.1 mmHg MV Decel Time: 197 msec     TR Vmax:        213.00 cm/s MV E velocity: 109.00 cm/s MV A velocity: 69.00 cm/s   SHUNTS MV E/A ratio:  1.58         Systemic Diam: 1.90 cm Sunit Tolia DO Electronically signed by Rex Kras DO Signature Date/Time: 02/01/2021/6:22:49 PM    Final     Microbiology: Recent Results (from the past 240 hour(s))  Resp Panel by RT-PCR (Flu A&B, Covid) Nasopharyngeal Swab     Status: Abnormal   Collection Time: 02/01/21 12:35 PM   Specimen: Nasopharyngeal Swab; Nasopharyngeal(NP) swabs in vial transport medium  Result Value Ref Range Status   SARS Coronavirus 2 by RT PCR POSITIVE (A) NEGATIVE Final    Comment: (NOTE) SARS-CoV-2 target nucleic acids are DETECTED.  The SARS-CoV-2 RNA is generally detectable in upper respiratory specimens during the acute phase of infection. Positive results are indicative of the presence of the identified virus, but do not rule out bacterial infection or co-infection with other pathogens not detected by the test. Clinical correlation with patient history and other diagnostic information is necessary to determine patient infection status. The expected result is Negative.  Fact Sheet for Patients: EntrepreneurPulse.com.au  Fact Sheet for Healthcare  Providers: IncredibleEmployment.be  This test is not yet approved or cleared by the [place] FDA and  has been authorized for detection and/or diagnosis of SARS-CoV-2 by FDA under an Emergency Use Authorization (EUA).  This EUA will remain in effect (meaning this test can be used) for the duration of  the COVID-19 declaration under Section 564(b)(1) of the A ct, 21 U.S.C. section 360bbb-3(b)(1), unless the authorization is terminated or revoked sooner.     Influenza A by PCR NEGATIVE NEGATIVE Final   Influenza B by PCR NEGATIVE NEGATIVE Final    Comment: (NOTE) The Xpert Xpress SARS-CoV-2/FLU/RSV plus assay is intended as an aid in the diagnosis of influenza from Nasopharyngeal swab specimens  and should not be used as a sole basis for treatment. Nasal washings and aspirates are unacceptable for Xpert Xpress SARS-CoV-2/FLU/RSV testing.  Fact Sheet for Patients: EntrepreneurPulse.com.au  Fact Sheet for Healthcare Providers: IncredibleEmployment.be  This test is not yet approved or cleared by the [place] FDA and has been authorized for detection and/or diagnosis of SARS-CoV-2 by FDA under an Emergency Use Authorization (EUA). This EUA will remain in effect (meaning this test can be used) for the duration of the COVID-19 declaration under Section 564(b)(1) of the Act, 21 U.S.C. section 360bbb-3(b)(1), unless the authorization is terminated or revoked.  Performed at [facility], [place] [address], [place], [place] [address]      Labs: Basic Metabolic Panel: Recent Labs  Lab 02/02/21 0345 02/02/21 0946 02/02/21 0951 02/03/21 0336 02/04/21 0426 02/05/21 0104 02/06/21 0331  NA 135   < > 137 137 138 138 141  K 4.9   < > 4.3 4.6 4.1 4.2 4.1  CL 101  --   --  103 104 103 105  CO2 20*  --   --  20* 21* 22 21*  GLUCOSE 439*  --   --  316* 217* 256* 149*  BUN 25*  --   --  38* 53* 57* 49*  CREATININE 1.22*   --   --  1.18* 1.63* 1.50* 1.41*  CALCIUM 8.7*  --   --  9.1 9.0 9.1 9.0  MG 2.0  --   --   --   --   --   --   PHOS 4.6  --   --   --   --   --   --    < > = values in this interval not displayed.   Liver Function Tests: Recent Labs  Lab 02/02/21 0345 02/03/21 0336 02/04/21 0426 02/05/21 0104 02/06/21 0331  AST 47* 205* 157* 124* 126*  ALT 28 94* 112* 126* 153*  ALKPHOS 141* 186* 257* 306* 303*  BILITOT 0.6 0.7 0.3 0.2* 0.5  PROT 7.2 6.8 6.9 7.0 7.1  ALBUMIN 3.2* 3.4* 3.3* 3.2* 3.3*   No results for input(s): LIPASE, AMYLASE in the last 168 hours. No results for input(s): AMMONIA in the last 168 hours. CBC: Recent Labs  Lab 02/01/21 1205 02/02/21 0345 02/02/21 0946 02/02/21 0951  WBC 8.1 10.1  --   --   NEUTROABS  --  6.9  --   --   HGB 11.0* 10.5* 10.2* 10.2*  HCT 34.9* 32.7* 30.0* 30.0*  MCV 99.7 97.6  --   --   PLT 269 268  --   --    Cardiac Enzymes: No results for input(s): CKTOTAL, CKMB, CKMBINDEX, TROPONINI in the last 168 hours. BNP: BNP (last 3 results) Recent Labs    02/01/21 1205  BNP 647.1*    ProBNP (last 3 results) No results for input(s): PROBNP in the last 8760 hours.  CBG: Recent Labs  Lab 02/05/21 1609 02/05/21 2123 02/06/21 0740 02/06/21 1111 02/06/21 1243  GLUCAP 173* 204* 169* 237* 250*       Signed:  Domenic Polite MD.  [facility] 02/06/2021, 2:59 PM

## 2021-02-10 ENCOUNTER — Telehealth (HOSPITAL_COMMUNITY): Payer: Self-pay

## 2021-02-10 NOTE — Telephone Encounter (Signed)
Per phase I cardiac rehab, fax cardiac rehab referral to [place] [facility]. °

## 2021-02-11 DIAGNOSIS — I2510 Atherosclerotic heart disease of native coronary artery without angina pectoris: Secondary | ICD-10-CM | POA: Diagnosis not present

## 2021-02-19 ENCOUNTER — Encounter (HOSPITAL_COMMUNITY): Payer: Self-pay | Admitting: Cardiology

## 2021-03-03 ENCOUNTER — Ambulatory Visit: Payer: Medicare Other | Admitting: Cardiology

## 2021-03-03 ENCOUNTER — Encounter: Payer: Self-pay | Admitting: Cardiology

## 2021-03-03 ENCOUNTER — Other Ambulatory Visit: Payer: Self-pay

## 2021-03-03 VITALS — BP 130/70 | HR 75 | Ht 63.0 in | Wt 225.6 lb

## 2021-03-03 DIAGNOSIS — I2510 Atherosclerotic heart disease of native coronary artery without angina pectoris: Secondary | ICD-10-CM | POA: Diagnosis not present

## 2021-03-03 DIAGNOSIS — E1165 Type 2 diabetes mellitus with hyperglycemia: Secondary | ICD-10-CM | POA: Diagnosis not present

## 2021-03-03 DIAGNOSIS — I5042 Chronic combined systolic (congestive) and diastolic (congestive) heart failure: Secondary | ICD-10-CM | POA: Diagnosis not present

## 2021-03-03 DIAGNOSIS — Z794 Long term (current) use of insulin: Secondary | ICD-10-CM

## 2021-03-03 DIAGNOSIS — I252 Old myocardial infarction: Secondary | ICD-10-CM

## 2021-03-03 DIAGNOSIS — Z8616 Personal history of COVID-19: Secondary | ICD-10-CM | POA: Diagnosis not present

## 2021-03-03 DIAGNOSIS — Z955 Presence of coronary angioplasty implant and graft: Secondary | ICD-10-CM

## 2021-03-03 DIAGNOSIS — I255 Ischemic cardiomyopathy: Secondary | ICD-10-CM | POA: Diagnosis not present

## 2021-03-03 DIAGNOSIS — Z6841 Body Mass Index (BMI) 40.0 and over, adult: Secondary | ICD-10-CM

## 2021-03-03 DIAGNOSIS — E039 Hypothyroidism, unspecified: Secondary | ICD-10-CM | POA: Diagnosis not present

## 2021-03-03 NOTE — Progress Notes (Signed)
ID:  Mallory Butler, DOB February 08, 1950, MRN [id number]  PCP:  Greig Right, MD  Cardiologist:  Rex Kras, DO, [facility] (established care 02/01/2021)  Date: 03/03/21  Chief Complaint  Patient presents with   non-ST elevated myocardial infarction   New Patient (Initial Visit)   Hospitalization Follow-up   Dark Stools    HPI  Mallory Butler is a 71 y.o. Caucasian female who presents to the office with a chief complaint of " posthospitalization follow-up, newly discovered CAD, ischemic cardiomyopathy, NSTEMI." Patient's past medical history and cardiovascular risk factors include: CAD status post angioplasty/PCI (distal LM/proximal LAD, and mid LAD), ischemic cardiomyopathy, acute on chronic systolic and diastolic heart failure, insulin-dependent diabetes mellitus type 2, hypertension, hyperlipidemia, postmenopausal female, obesity due to excess calories.  She is referred to the office at the request of Mallory Morning, DO for evaluation of reestablish care after recent hospitalization for NSTEMI/CAD/status post PCI.  Patient is accompanied by her son Mallory Butler, patient provides verbal consent for having him present during today's encounter.  Patient was seen in consultation in January 2023 when she presented to the hospital chief complaint of shortness of breath.  She was diagnosed with COVID-19 infection, work-up notable for elevated troponins suggestive of NSTEMI, patient noted to have reduced LVEF with regional wall motion abnormalities on echocardiogram, subsequently underwent left heart catheterization was noted to have obstructive CAD in the LAD distribution.  She underwent angioplasty/PCI to the distal left main/proximal LAD and mid LAD.   After appropriate diuresis patient was discharged home on GDMT and asked to follow-up as outpatient for escalation of therapy.  Since hospitalization patient states that she is moving around her house more but with effort related activities continues to have  shortness of breath.  She does not check her weights at home.  Her diet is high in canned foods (soups, beans, vegetables).  She is educated on the importance of salt restriction and fluid restrictions for now until she is euvolemic.   She denies angina pectoris.  She is compliant with dual antiplatelet therapy.  FUNCTIONAL STATUS: No structured exercise program or daily routine.   ALLERGIES: No Known Allergies  MEDICATION LIST PRIOR TO VISIT: Current Meds  Medication Sig   aspirin EC 81 MG tablet Take 1 tablet by mouth daily.   bisoprolol (ZEBETA) 10 MG tablet Take 10 mg by mouth daily.   dapagliflozin propanediol (FARXIGA) 10 MG TABS tablet Take 1 tablet (10 mg total) by mouth daily.   glipiZIDE (GLUCOTROL) 10 MG tablet Take 10 mg by mouth 2 (two) times daily before a meal.   glucose blood (ONETOUCH ULTRA) test strip USE TO TEST 3 TIMES DAILY   insulin aspart protamine - aspart (NOVOLOG MIX 70/30 FLEXPEN) (70-30) 100 UNIT/ML FlexPen Inject 35 Units into the skin 2 (two) times daily with a meal. 45units in Butler and 30-35units in evening   levothyroxine (SYNTHROID) 88 MCG tablet Take 88 mcg by mouth daily before breakfast.   losartan (COZAAR) 25 MG tablet Take 0.5 tablets (12.5 mg total) by mouth daily.   metFORMIN (GLUCOPHAGE) 1000 MG tablet Take 1 tablet by mouth 2 (two) times a day.   Multiple Vitamin (MULTIVITAMIN) capsule Take 1 capsule by mouth daily.   oxybutynin (DITROPAN XL) 15 MG 24 hr tablet Take 15 mg by mouth daily.   oxybutynin (DITROPAN) 5 MG tablet Take 5 mg by mouth in the Butler, at noon, and at bedtime. TAKE 1 TABLET BY MOUTH TWICE A DAY FOR BLADDER  simvastatin (ZOCOR) 80 MG tablet Take 1 tablet by mouth daily.   ticagrelor (BRILINTA) 90 MG TABS tablet Take 1 tablet (90 mg total) by mouth 2 (two) times daily.   venlafaxine XR (EFFEXOR-XR) 150 MG 24 hr capsule 150 mg daily with breakfast.     PAST MEDICAL HISTORY: Past Medical History:  Diagnosis Date   CHF  (congestive heart failure) ([place])    Coronary artery disease    COVID-19    Diabetes mellitus without complication ([place])    Hyperlipidemia    NSTEMI (non-ST elevated myocardial infarction) ([place])    Thyroid disease     PAST SURGICAL HISTORY: Past Surgical History:  Procedure Laterality Date   CORONARY STENT INTERVENTION N/A 02/02/2021   Procedure: CORONARY STENT INTERVENTION;  Surgeon: Adrian Prows, MD;  Location: [place] [facility];  Service: Cardiovascular;  Laterality: N/A;   KNEE SURGERY Left    RIGHT/LEFT HEART CATH AND CORONARY ANGIOGRAPHY N/A 02/02/2021   Procedure: RIGHT/LEFT HEART CATH AND CORONARY ANGIOGRAPHY;  Surgeon: Adrian Prows, MD;  Location: [place] [facility];  Service: Cardiovascular;  Laterality: N/A;    FAMILY HISTORY: The patient family history includes Cancer in her mother; Heart attack in her father and sister; Hyperlipidemia in her mother; Hypertension in her father.  SOCIAL HISTORY:  The patient  reports that she has quit smoking. Her smoking use included cigarettes. She has never used smokeless tobacco. She reports that she does not currently use alcohol.  REVIEW OF SYSTEMS: Review of Systems  Cardiovascular:  Positive for dyspnea on exertion, leg swelling, orthopnea and paroxysmal nocturnal dyspnea. Negative for chest pain, palpitations and syncope.  Respiratory:  Positive for shortness of breath.    PHYSICAL EXAM: Vitals with BMI 03/03/2021 02/06/2021 02/06/2021  Height $Remov'5\' 3"'PwVoWi$  - -  Weight 225 lbs 10 oz - -  BMI 13.24 - -  Systolic 401 027 -  Diastolic 70 78 -  Pulse 75 - 74    CONSTITUTIONAL: Appears older than stated age, obese, hemodynamically stable, no acute distress. Currently in wheelchair.  SKIN: Skin is warm and dry. No rash noted. No cyanosis. No pallor. No jaundice HEAD: Normocephalic and atraumatic.  EYES: No scleral icterus MOUTH/THROAT: Moist oral membranes.  NECK: Unable to evaluate JVP due to short neck stature and adipose tissue,  No  carotid bruits  LYMPHATIC: No visible cervical adenopathy.  CHEST Normal respiratory effort. No intercostal retractions  LUNGS: Clear to auscultation bilaterally.  No stridor. No wheezes. No rales.  CARDIOVASCULAR: Regular rate and rhythm, positive S1-S2, no murmurs rubs or gallops appreciated. ABDOMINAL: Obese, soft, nontender, nondistended, positive bowel sounds in all 4 quadrants, no apparent ascites.  EXTREMITIES: +1 bilateral peripheral edema, warm to touch, palpable DP and PT pulses HEMATOLOGIC: No significant bruising NEUROLOGIC: Oriented to person, place, and time. Nonfocal. Normal muscle tone.  PSYCHIATRIC: Normal mood and affect. Normal behavior. Cooperative  CARDIAC DATABASE: EKG: 03/03/2021: NSR, 73 bpm, left axis, left anterior fascicular block, LVH per voltage criteria cannot rule out old anterior infarct, TWI suggestive of possible anterolateral ischemia or secondary to repolarization.  Echocardiogram: 02/01/2021:  1. Left ventricular ejection fraction, by estimation, is 30 to 35%. The  left ventricle has moderately decreased function. The left ventricle  demonstrates regional wall motion abnormalities (see scoring  diagram/findings for description). There is mild  left ventricular hypertrophy. Left ventricular diastolic parameters are  consistent with Grade I diastolic dysfunction (impaired relaxation).  Elevated left ventricular end-diastolic pressure. The mid and distal  anterior wall, entire  anterior septum,  entire apex, mid and distal inferior wall, and mid inferoseptal segment  are hypokinetic.   2. Right ventricular systolic function is normal. The right ventricular  size is normal. There is normal pulmonary artery systolic pressure. The  estimated right ventricular systolic pressure is 81.2 mmHg.   3. The mitral valve is degenerative. Mild mitral valve regurgitation. No  evidence of mitral stenosis.   4. The aortic valve was not well visualized. Aortic valve  regurgitation  is not visualized. No aortic stenosis is present.   5. The inferior vena cava is dilated in size with <50% respiratory  variability, suggesting right atrial pressure of 15 mmHg.   Conclusion(s)/Recommendation(s): No left ventricular mural or apical  thrombus/thrombi.   Stress Testing: No results found for this or any previous visit from the past 1095 days.  Heart Catheterization: 02/02/2021:   Mid Cx lesion is 90% stenosed.   Ost Cx to Prox Cx lesion is 30% stenosed.   1st Diag lesion is 99% stenosed.   Prox LAD to Mid LAD lesion is 100% stenosed.   A drug-eluting stent was successfully placed using a STENT ONYX FRONTIER [id number].   A drug-eluting stent was successfully placed using a STENT ONYX FRONTIER [id number].   Post intervention, there is a 0% residual stenosis.   Right and left Heart Catheterization 02/02/21:   RA: 16/14, mean 14 mmHg RV 52/15, EDP 17 mmHg PA 50/22, mean 37 mmHg. PW 27/29, mean 24 mmHg. QP/QS 1.00.  CO 4.2, CI 2.02 by Fick.   LV: 112/15, EDP 29 mmHg.  Ao: 97/60, mean 76 mmHg.  There was no pressure gradient across the aortic valve. LM: Mildly calcified but widely patent. LAD: Large vessel in the proximal segment, moderately calcified, occluded in the midsegment after the origin of a large diagonal 1 which has ostial 99% stenosis. CX: Large vessel, proximal segment has a 30% stenosis, large OM1 with a proximal 20 to 30% stenosis, AV groove has 90 to 95% stenosis in the proximal to mid segment. RCA: Large vessel, mild luminal irregularity.   Interventional data: Successful PTCA and stenting of the mid LAD with implantation of a 2.5 x 26 mm Onyx frontier DES, proximally into the proximal LAD and into the distal LM 82.75 x 12 mm Onyx frontier stent deployed postdilated with 3.5 x 8 mm [place] balloon at 20 atm pressure for 30 seconds x 2.  Complete expansion of the stent into the left main.  No sidebranch jailing of D1 which has ostial 99% stenosis.  TIMI 0  to TIMI-3 flow at the end of the procedure.  110 mL contrast utilized.  Recommendation: Continue aspirin indefinitely and Brilinta for at least a period of 1 year.  Medical therapy for circumflex disease unless she has recurrence of angina pectoris.  Medical therapy for D1 disease.  LABORATORY DATA: CBC Latest Ref Rng & Units 02/02/2021 02/02/2021 02/02/2021  WBC 4.0 - 10.5 K/uL - - 10.1  Hemoglobin 12.0 - 15.0 g/dL 10.2(L) 10.2(L) 10.5(L)  Hematocrit 36.0 - 46.0 % 30.0(L) 30.0(L) 32.7(L)  Platelets 150 - 400 K/uL - - 268    CMP Latest Ref Rng & Units 02/06/2021 02/05/2021 02/04/2021  Glucose 70 - 99 mg/dL 149(H) 256(H) 217(H)  BUN 8 - 23 mg/dL 49(H) 57(H) 53(H)  Creatinine 0.44 - 1.00 mg/dL 1.41(H) 1.50(H) 1.63(H)  Sodium 135 - 145 mmol/L 141 138 138  Potassium 3.5 - 5.1 mmol/L 4.1 4.2 4.1  Chloride 98 - 111 mmol/L 105 103 104  CO2 22 - 32 mmol/L 21(L) 22 21(L)  Calcium 8.9 - 10.3 mg/dL 9.0 9.1 9.0  Total Protein 6.5 - 8.1 g/dL 7.1 7.0 6.9  Total Bilirubin 0.3 - 1.2 mg/dL 0.5 0.2(L) 0.3  Alkaline Phos 38 - 126 U/L 303(H) 306(H) 257(H)  AST 15 - 41 U/L 126(H) 124(H) 157(H)  ALT 0 - 44 U/L 153(H) 126(H) 112(H)    Lipid Panel     Component Value Date/Time   CHOL 140 02/02/2021 0345   TRIG 120 02/02/2021 0345   HDL 37 (L) 02/02/2021 0345   CHOLHDL 3.8 02/02/2021 0345   VLDL 24 02/02/2021 0345   LDLCALC 79 02/02/2021 0345    No components found for: NTPROBNP No results for input(s): PROBNP in the last 8760 hours. Recent Labs    02/01/21 1959  TSH 3.953    BMP Recent Labs    02/04/21 0426 02/05/21 0104 02/06/21 0331  NA 138 138 141  K 4.1 4.2 4.1  CL 104 103 105  CO2 21* 22 21*  GLUCOSE 217* 256* 149*  BUN 53* 57* 49*  CREATININE 1.63* 1.50* 1.41*  CALCIUM 9.0 9.1 9.0  GFRNONAA 34* 37* 40*    HEMOGLOBIN A1C Lab Results  Component Value Date   HGBA1C 12.0 (H) 02/01/2021   MPG 297.7 02/01/2021    IMPRESSION:    ICD-10-CM   1. Atherosclerosis of native  coronary artery of native heart without angina pectoris  I25.10 CMP14+EGFR    Pro b natriuretic peptide (BNP)    Magnesium    2. S/P primary angioplasty and PCI (distal left main/proximal LAD and mid LAD)  Z95.5     3. History of non-ST elevation myocardial infarction (NSTEMI)  I25.2 EKG 12-Lead    CMP14+EGFR    Pro b natriuretic peptide (BNP)    Magnesium    4. Ischemic cardiomyopathy  I25.5 CMP14+EGFR    Pro b natriuretic peptide (BNP)    Magnesium    5. Chronic combined systolic and diastolic CHF (congestive heart failure) (HCC)  I50.42 CMP14+EGFR    Pro b natriuretic peptide (BNP)    Magnesium    6. History of COVID-19  Z86.16     7. Type 2 diabetes mellitus with hyperglycemia, with long-term current use of insulin (HCC)  E11.65    Z79.4     8. Long-term insulin use (HCC)  Z79.4     9. Hypothyroidism, unspecified type  E03.9     10. Class 3 severe obesity due to excess calories with serious comorbidity and body mass index (BMI) of 40.0 to 44.9 in adult [facility])  E66.01    Z68.41        RECOMMENDATIONS: EREN PUEBLA is a 71 y.o. Caucasian female whose past medical history and cardiac risk factors include: CAD status post angioplasty/PCI (distal LM/proximal LAD, and mid LAD), ischemic cardiomyopathy, acute on chronic systolic and diastolic heart failure, insulin-dependent diabetes mellitus type 2, hypertension, hyperlipidemia, postmenopausal female, obesity due to excess calories.  Recently hospitalized for COVID-19 infection and also ruled in for non-STEMI.  Echocardiogram noted moderately reduced LVEF with regional wall motion abnormalities.  Subsequently underwent left heart catheterization and was noted to have obstructive CAD in the LAD distribution.  She underwent PCI to the distal left main/proximal LAD and mid LAD.  She is currently on dual antiplatelet therapy and recommended DAPT for at least 1 year given her presentation of ACS.  Continue DAPT therapy until  02/02/2022.  With regards to heart failure management she was  given parenteral diuretic therapy during her hospitalization and was becoming euvolemic; however, due to acute kidney injury secondary to diuresis medications were down titrated.  She was discharged home on Farxiga, losartan, spironolactone.  She is supposed to have labs prior to today's office visit so the medication titrated; however, patient forgot.  I will enroll her into principal care management as we uptitrate her GDMT for close monitoring of her weight, labs, and needing assistance with medication approval/patient assistance.  Ordered BMP, NT proBNP, magnesium levels.  If renal function is relatively at baseline would recommend transitioning losartan to [facility].  Had a long discussion with both the patient and her son and that she will need close follow-up for up titration of GDMT and to prevent hospitalization.  Once she is on guideline directed medical therapy at maximally tolerated doses would recommend an echocardiogram in 90 days to reevaluate LVEF.  Patient is also educated on importance of improving her other cardiovascular risk factors.  Her most recent hemoglobin A1c was reported to be 12.  She is currently on insulin and oral antiglycemic agents.  Patient is encouraged to follow-up with endocrinology for further evaluation and management.  Her fasting LDL was 79 mg/dL.  Continue Zocor 80 mg p.o. nightly for now.  I would like to see her back in 1 month to reevaluate her symptoms and uptitrate GDMT as tolerated.  Total 58 minutes: As part of today's office visit reviewed hospitalization records including H&P, discharge, cardiology consultation last progress note, medication reconciliation was performed, recent labs were independently reviewed, reviewed the results of the echocardiogram and left heart catheterization.  HPI was also obtained by her son at today's office visit.  Patient is enrolled into principal care  management as discussed above.  Also order additional diagnostic testing as noted above.   FINAL MEDICATION LIST END OF ENCOUNTER: No orders of the defined types were placed in this encounter.   Medications Discontinued During This Encounter  Medication Reason   mirabegron ER (MYRBETRIQ) 50 MG TB24 tablet      Current Outpatient Medications:    aspirin EC 81 MG tablet, Take 1 tablet by mouth daily., Disp: , Rfl:    bisoprolol (ZEBETA) 10 MG tablet, Take 10 mg by mouth daily., Disp: , Rfl:    dapagliflozin propanediol (FARXIGA) 10 MG TABS tablet, Take 1 tablet (10 mg total) by mouth daily., Disp: 30 tablet, Rfl: 0   glipiZIDE (GLUCOTROL) 10 MG tablet, Take 10 mg by mouth 2 (two) times daily before a meal., Disp: , Rfl:    glucose blood (ONETOUCH ULTRA) test strip, USE TO TEST 3 TIMES DAILY, Disp: , Rfl:    insulin aspart protamine - aspart (NOVOLOG MIX 70/30 FLEXPEN) (70-30) 100 UNIT/ML FlexPen, Inject 35 Units into the skin 2 (two) times daily with a meal. 45units in [place] and 30-35units in evening, Disp: , Rfl:    levothyroxine (SYNTHROID) 88 MCG tablet, Take 88 mcg by mouth daily before breakfast., Disp: , Rfl:    losartan (COZAAR) 25 MG tablet, Take 0.5 tablets (12.5 mg total) by mouth daily., Disp: 30 tablet, Rfl: 0   metFORMIN (GLUCOPHAGE) 1000 MG tablet, Take 1 tablet by mouth 2 (two) times a day., Disp: , Rfl:    Multiple Vitamin (MULTIVITAMIN) capsule, Take 1 capsule by mouth daily., Disp: , Rfl:    oxybutynin (DITROPAN XL) 15 MG 24 hr tablet, Take 15 mg by mouth daily., Disp: , Rfl:    oxybutynin (DITROPAN) 5 MG tablet, Take 5 mg  by mouth in the [place], at noon, and at bedtime. TAKE 1 TABLET BY MOUTH TWICE A DAY FOR BLADDER, Disp: , Rfl:    simvastatin (ZOCOR) 80 MG tablet, Take 1 tablet by mouth daily., Disp: , Rfl:    ticagrelor (BRILINTA) 90 MG TABS tablet, Take 1 tablet (90 mg total) by mouth 2 (two) times daily., Disp: 60 tablet, Rfl: 0   venlafaxine XR (EFFEXOR-XR) 150 MG  24 hr capsule, 150 mg daily with breakfast., Disp: , Rfl:    spironolactone (ALDACTONE) 25 MG tablet, Take 0.5 tablets (12.5 mg total) by mouth daily., Disp: 30 tablet, Rfl: 0  Orders Placed This Encounter  Procedures   CMP14+EGFR   Pro b natriuretic peptide (BNP)   Magnesium   EKG 12-Lead    There are no Patient Instructions on file for this visit.   --Continue cardiac medications as reconciled in final medication list. --Return in about 4 weeks (around 03/31/2021) for Follow up, heart failure management.. Or sooner if needed. --Continue follow-up with your primary care physician regarding the management of your other chronic comorbid conditions.  Patient's questions and concerns were addressed to her satisfaction. She voices understanding of the instructions provided during this encounter.   This note was created using a voice recognition software as a result there may be grammatical errors inadvertently enclosed that do not reflect the nature of this encounter. Every attempt is made to correct such errors.  Rex Kras, [place], [facility]  Pager: [phone] Office: [phone]

## 2021-03-05 DIAGNOSIS — I255 Ischemic cardiomyopathy: Secondary | ICD-10-CM | POA: Diagnosis not present

## 2021-03-05 DIAGNOSIS — I2510 Atherosclerotic heart disease of native coronary artery without angina pectoris: Secondary | ICD-10-CM | POA: Diagnosis not present

## 2021-03-05 DIAGNOSIS — I252 Old myocardial infarction: Secondary | ICD-10-CM | POA: Diagnosis not present

## 2021-03-05 DIAGNOSIS — I5042 Chronic combined systolic (congestive) and diastolic (congestive) heart failure: Secondary | ICD-10-CM | POA: Diagnosis not present

## 2021-03-06 ENCOUNTER — Telehealth: Payer: Self-pay | Admitting: Pharmacist

## 2021-03-06 ENCOUNTER — Ambulatory Visit: Payer: Medicare Other | Admitting: Cardiology

## 2021-03-06 DIAGNOSIS — I5042 Chronic combined systolic (congestive) and diastolic (congestive) heart failure: Secondary | ICD-10-CM

## 2021-03-06 LAB — CMP14+EGFR
ALT: 13 IU/L (ref 0–32)
AST: 16 IU/L (ref 0–40)
Albumin/Globulin Ratio: 1.2 (ref 1.2–2.2)
Albumin: 4 g/dL (ref 3.8–4.8)
Alkaline Phosphatase: 129 IU/L — ABNORMAL HIGH (ref 44–121)
BUN/Creatinine Ratio: 23 (ref 12–28)
BUN: 24 mg/dL (ref 8–27)
Bilirubin Total: 0.4 mg/dL (ref 0.0–1.2)
CO2: 21 mmol/L (ref 20–29)
Calcium: 9.5 mg/dL (ref 8.7–10.3)
Chloride: 102 mmol/L (ref 96–106)
Creatinine, Ser: 1.03 mg/dL — ABNORMAL HIGH (ref 0.57–1.00)
Globulin, Total: 3.4 g/dL (ref 1.5–4.5)
Glucose: 206 mg/dL — ABNORMAL HIGH (ref 70–99)
Potassium: 3.9 mmol/L (ref 3.5–5.2)
Sodium: 140 mmol/L (ref 134–144)
Total Protein: 7.4 g/dL (ref 6.0–8.5)
eGFR: 58 mL/min/{1.73_m2} — ABNORMAL LOW (ref >59)

## 2021-03-06 LAB — MAGNESIUM: Magnesium: 2.1 mg/dL (ref 1.6–2.3)

## 2021-03-06 LAB — PRO B NATRIURETIC PEPTIDE: NT-Pro BNP: 5947 pg/mL — ABNORMAL HIGH (ref 0–301)

## 2021-03-06 NOTE — Telephone Encounter (Signed)
-----   Message from [place], [place] sent at 03/06/2021  1:26 PM EST ----- ?Renal function has improved. ?Would recommend discontinuation of losartan and start Entresto 49/51 mg p.o. twice daily with labs in 1 week to evaluate kidney function and electrolytes. ?Please follow-up with the patient, thank you ? ?Sunit Bolckow, DO, FACC ?

## 2021-03-09 ENCOUNTER — Other Ambulatory Visit: Payer: Self-pay | Admitting: Cardiology

## 2021-03-09 DIAGNOSIS — I5042 Chronic combined systolic (congestive) and diastolic (congestive) heart failure: Secondary | ICD-10-CM

## 2021-03-09 NOTE — Addendum Note (Signed)
Addended by: Cassell Clement T on: 03/09/2021 03:37 PM ? ? Modules accepted: Orders ? ?

## 2021-03-09 NOTE — Telephone Encounter (Signed)
Pt called to clarify her recent medication changes. Pt reports that she has been complaint to her current therapy of bisoprolol 10 mg, farxiga 10 mg, spironolactone 12.5 mg. Pt confirmed that she doesn't take her losartan any longer and that her son did drop off the Entresto 49/51 mg BID last Friday. Pt reports that she hasnt started it yet, but agrees to start taking it starting tonight. Pt to get labwork in 1 week.  ?

## 2021-03-16 DIAGNOSIS — E1169 Type 2 diabetes mellitus with other specified complication: Secondary | ICD-10-CM | POA: Diagnosis not present

## 2021-03-16 DIAGNOSIS — I10 Essential (primary) hypertension: Secondary | ICD-10-CM | POA: Diagnosis not present

## 2021-03-16 DIAGNOSIS — E113219 Type 2 diabetes mellitus with mild nonproliferative diabetic retinopathy with macular edema, unspecified eye: Secondary | ICD-10-CM | POA: Diagnosis not present

## 2021-03-16 DIAGNOSIS — N1831 Chronic kidney disease, stage 3a: Secondary | ICD-10-CM | POA: Diagnosis not present

## 2021-03-16 DIAGNOSIS — I2510 Atherosclerotic heart disease of native coronary artery without angina pectoris: Secondary | ICD-10-CM | POA: Diagnosis not present

## 2021-03-31 ENCOUNTER — Ambulatory Visit: Payer: Medicare Other | Admitting: Cardiology

## 2021-04-01 ENCOUNTER — Other Ambulatory Visit: Payer: Self-pay

## 2021-04-01 MED ORDER — SACUBITRIL-VALSARTAN 49-51 MG PO TABS: 1.0000 | ORAL_TABLET | Freq: Two times a day (BID) | ORAL | 3 refills | Status: DC

## 2021-04-01 MED ORDER — TICAGRELOR 90 MG PO TABS: 90.0000 mg | ORAL_TABLET | Freq: Two times a day (BID) | ORAL | 2 refills | Status: DC

## 2021-04-07 NOTE — Progress Notes (Signed)
Teny,  ?FYI. Outside labs I received.  ?Looks like they reflect her being on Entresto 49/51mg po bid.  ?Lets review her data and make changes if needed.  ? ?Her DM is not well controlled. Please reemphasized the importance of glycemic control.  Find out if she is currently seeing either PCP or endocrinology regarding this.  And if not please direct her that way.  ? ?Cholesterol needs to be better controlled as well given her comorbidities.  Recommend discontinuing simvastatin 80 mg p.o. nightly and transitioning her to Crestor 40 mg p.o. nightly ? ?External Labs: ?Collected: 03/16/2021 provided by [facility] ?Total cholesterol 175, triglycerides 225, HDL 38, LDL 92, non-HDL 137. ?Sodium 139, potassium 5.3, chloride 103, bicarb 23, BUN 44, creatinine 0.96. ?eGFR 60. ?Hemoglobin A1c 13.2 ? ?Sunit Tolia, DO, FACC ?

## 2021-04-08 NOTE — Progress Notes (Signed)
Discussed with pt and pt's son. Currently, DM is being managed by PCP. Doesn't have an established care with an endocrinologist. Pt was started on Ozempic through PCP, but pt hastn started it yet, planing on starting this Friday. Has been making an active effort in reducing her carb intake. Pt reports no changes were made to her statin therapy at the time.  ? ?Pt tolerating entresto 49/51 mg BID without significant complains. Reports that she has noticed improvement in her SOB and edema complains since starting Entresto. Pt isnt currently enrolled in remote monitoring and pt hasnt been tracking her BP or weight readings at home.  ? ?Pt to bring in her home medications to her upcoming OV on 04/14/21. May considering increasing entresto dose to 97/103 mg at the upcoming OV.

## 2021-04-10 DIAGNOSIS — I5022 Chronic systolic (congestive) heart failure: Secondary | ICD-10-CM | POA: Diagnosis not present

## 2021-04-14 ENCOUNTER — Ambulatory Visit: Payer: Medicare Other | Admitting: Cardiology

## 2021-04-14 ENCOUNTER — Encounter: Payer: Self-pay | Admitting: Cardiology

## 2021-04-14 VITALS — BP 103/51 | HR 91 | Temp 97.6°F | Resp 16 | Ht 63.0 in | Wt 223.0 lb

## 2021-04-14 DIAGNOSIS — I255 Ischemic cardiomyopathy: Secondary | ICD-10-CM

## 2021-04-14 DIAGNOSIS — I5042 Chronic combined systolic (congestive) and diastolic (congestive) heart failure: Secondary | ICD-10-CM

## 2021-04-14 DIAGNOSIS — E1165 Type 2 diabetes mellitus with hyperglycemia: Secondary | ICD-10-CM

## 2021-04-14 DIAGNOSIS — Z8616 Personal history of COVID-19: Secondary | ICD-10-CM | POA: Diagnosis not present

## 2021-04-14 DIAGNOSIS — Z794 Long term (current) use of insulin: Secondary | ICD-10-CM

## 2021-04-14 DIAGNOSIS — I2510 Atherosclerotic heart disease of native coronary artery without angina pectoris: Secondary | ICD-10-CM | POA: Diagnosis not present

## 2021-04-14 DIAGNOSIS — Z955 Presence of coronary angioplasty implant and graft: Secondary | ICD-10-CM

## 2021-04-14 DIAGNOSIS — I252 Old myocardial infarction: Secondary | ICD-10-CM

## 2021-04-14 MED ORDER — ATORVASTATIN CALCIUM 40 MG PO TABS: 40.0000 mg | ORAL_TABLET | Freq: Every evening | ORAL | 0 refills | Status: DC

## 2021-04-14 MED ORDER — EZETIMIBE 10 MG PO TABS: 10.0000 mg | ORAL_TABLET | Freq: Every day | ORAL | 0 refills | Status: DC
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
## 2021-04-16 DIAGNOSIS — E113313 Type 2 diabetes mellitus with moderate nonproliferative diabetic retinopathy with macular edema, bilateral: Secondary | ICD-10-CM | POA: Diagnosis not present

## 2021-04-16 DIAGNOSIS — H25813 Combined forms of age-related cataract, bilateral: Secondary | ICD-10-CM | POA: Diagnosis not present

## 2021-04-16 DIAGNOSIS — H353132 Nonexudative age-related macular degeneration, bilateral, intermediate dry stage: Secondary | ICD-10-CM | POA: Diagnosis not present

## 2021-04-22 ENCOUNTER — Other Ambulatory Visit: Payer: Self-pay

## 2021-04-22 DIAGNOSIS — Z955 Presence of coronary angioplasty implant and graft: Secondary | ICD-10-CM

## 2021-04-22 DIAGNOSIS — I5042 Chronic combined systolic (congestive) and diastolic (congestive) heart failure: Secondary | ICD-10-CM

## 2021-04-22 DIAGNOSIS — I255 Ischemic cardiomyopathy: Secondary | ICD-10-CM

## 2021-04-22 DIAGNOSIS — I252 Old myocardial infarction: Secondary | ICD-10-CM

## 2021-04-26 ENCOUNTER — Other Ambulatory Visit: Payer: Self-pay | Admitting: Cardiology

## 2021-04-26 DIAGNOSIS — I255 Ischemic cardiomyopathy: Secondary | ICD-10-CM

## 2021-04-26 DIAGNOSIS — I2510 Atherosclerotic heart disease of native coronary artery without angina pectoris: Secondary | ICD-10-CM

## 2021-04-26 DIAGNOSIS — I252 Old myocardial infarction: Secondary | ICD-10-CM

## 2021-04-26 DIAGNOSIS — Z955 Presence of coronary angioplasty implant and graft: Secondary | ICD-10-CM

## 2021-04-29 DIAGNOSIS — E113219 Type 2 diabetes mellitus with mild nonproliferative diabetic retinopathy with macular edema, unspecified eye: Secondary | ICD-10-CM | POA: Diagnosis not present

## 2021-04-29 DIAGNOSIS — E038 Other specified hypothyroidism: Secondary | ICD-10-CM | POA: Diagnosis not present

## 2021-04-29 DIAGNOSIS — I10 Essential (primary) hypertension: Secondary | ICD-10-CM | POA: Diagnosis not present

## 2021-04-29 DIAGNOSIS — E1169 Type 2 diabetes mellitus with other specified complication: Secondary | ICD-10-CM | POA: Diagnosis not present

## 2021-04-29 DIAGNOSIS — N1831 Chronic kidney disease, stage 3a: Secondary | ICD-10-CM | POA: Diagnosis not present

## 2021-04-29 DIAGNOSIS — I2510 Atherosclerotic heart disease of native coronary artery without angina pectoris: Secondary | ICD-10-CM | POA: Diagnosis not present

## 2021-05-07 DIAGNOSIS — Z01818 Encounter for other preprocedural examination: Secondary | ICD-10-CM | POA: Diagnosis not present

## 2021-05-07 DIAGNOSIS — H25811 Combined forms of age-related cataract, right eye: Secondary | ICD-10-CM | POA: Diagnosis not present

## 2021-05-07 DIAGNOSIS — E113313 Type 2 diabetes mellitus with moderate nonproliferative diabetic retinopathy with macular edema, bilateral: Secondary | ICD-10-CM | POA: Diagnosis not present

## 2021-05-07 DIAGNOSIS — H25812 Combined forms of age-related cataract, left eye: Secondary | ICD-10-CM | POA: Diagnosis not present

## 2021-05-07 DIAGNOSIS — H353132 Nonexudative age-related macular degeneration, bilateral, intermediate dry stage: Secondary | ICD-10-CM | POA: Diagnosis not present

## 2021-05-10 DIAGNOSIS — I5022 Chronic systolic (congestive) heart failure: Secondary | ICD-10-CM | POA: Diagnosis not present

## 2021-05-14 ENCOUNTER — Telehealth: Payer: Self-pay | Admitting: Family Medicine

## 2021-05-14 ENCOUNTER — Telehealth: Payer: Self-pay

## 2021-05-14 DIAGNOSIS — H25812 Combined forms of age-related cataract, left eye: Secondary | ICD-10-CM | POA: Diagnosis not present

## 2021-05-14 NOTE — Telephone Encounter (Signed)
Pt called requesting we refill her Spironolactone. It does not look like this ws prescribed by [place]. Please advise. ?

## 2021-05-18 ENCOUNTER — Other Ambulatory Visit: Payer: Self-pay

## 2021-05-18 MED ORDER — SPIRONOLACTONE 25 MG PO TABS: 12.5000 mg | ORAL_TABLET | Freq: Every day | ORAL | 0 refills | Status: DC

## 2021-05-18 NOTE — Telephone Encounter (Signed)
Medication has been sent to pharmacy.  °

## 2021-05-18 NOTE — Telephone Encounter (Signed)
Refill spironolactone 12.5 mg p.o. daily.  180-day supply no refill. ? ?Mallory Butler Odis Hollingshead, DO, [facility]

## 2021-06-08 DIAGNOSIS — G5601 Carpal tunnel syndrome, right upper limb: Secondary | ICD-10-CM | POA: Diagnosis not present

## 2021-06-08 DIAGNOSIS — I10 Essential (primary) hypertension: Secondary | ICD-10-CM | POA: Diagnosis not present

## 2021-06-08 DIAGNOSIS — M720 Palmar fascial fibromatosis [Dupuytren]: Secondary | ICD-10-CM | POA: Diagnosis not present

## 2021-06-08 DIAGNOSIS — E113219 Type 2 diabetes mellitus with mild nonproliferative diabetic retinopathy with macular edema, unspecified eye: Secondary | ICD-10-CM | POA: Diagnosis not present

## 2021-06-08 DIAGNOSIS — E1169 Type 2 diabetes mellitus with other specified complication: Secondary | ICD-10-CM | POA: Diagnosis not present

## 2021-07-14 ENCOUNTER — Encounter: Payer: Self-pay | Admitting: Cardiology

## 2021-07-14 ENCOUNTER — Ambulatory Visit: Payer: Medicare Other | Admitting: Cardiology

## 2021-07-14 VITALS — BP 136/62 | HR 80 | Temp 98.0°F | Resp 17 | Ht 63.0 in | Wt 223.2 lb

## 2021-07-14 DIAGNOSIS — Z955 Presence of coronary angioplasty implant and graft: Secondary | ICD-10-CM

## 2021-07-14 DIAGNOSIS — I252 Old myocardial infarction: Secondary | ICD-10-CM

## 2021-07-14 DIAGNOSIS — E1165 Type 2 diabetes mellitus with hyperglycemia: Secondary | ICD-10-CM

## 2021-07-14 DIAGNOSIS — I5042 Chronic combined systolic (congestive) and diastolic (congestive) heart failure: Secondary | ICD-10-CM

## 2021-07-14 DIAGNOSIS — I2510 Atherosclerotic heart disease of native coronary artery without angina pectoris: Secondary | ICD-10-CM

## 2021-07-14 DIAGNOSIS — Z8616 Personal history of COVID-19: Secondary | ICD-10-CM

## 2021-07-14 DIAGNOSIS — Z794 Long term (current) use of insulin: Secondary | ICD-10-CM

## 2021-07-14 DIAGNOSIS — I255 Ischemic cardiomyopathy: Secondary | ICD-10-CM

## 2021-07-14 NOTE — Progress Notes (Signed)
ID:  Mallory Butler, DOB 1950-12-22, MRN [id number]  PCP:  Greig Right, MD  Cardiologist:  Rex Kras, DO, [facility] (established care 02/01/2021)  Date: 07/14/21 Last Office Visit: 04/14/2021  Chief Complaint  Patient presents with   Coronary Artery Disease   Congestive Heart Failure   NSTEMI    3 MONTH    HPI  Mallory Butler is a 71 y.o. Caucasian female whose past medical history and cardiovascular risk factors include: CAD status post angioplasty/PCI (distal LM/proximal LAD, and mid LAD), ischemic cardiomyopathy, chronic systolic and diastolic heart failure, insulin-dependent diabetes mellitus type 2, hypertension, hyperlipidemia, postmenopausal female, obesity due to excess calories.  She is referred to the office at the request of Greig Right, MD for evaluation of reestablish care after recent hospitalization for NSTEMI/CAD/status post PCI.  Establish care in January 2023 when she was admitted to the hospital for shortness of breath and diagnosed with COVID-19 pneumonia.  Echocardiogram noted reduced LVEF and regional wall motion abnormalities and given the elevated cardiac biomarkers she underwent angiography and was noted to have obstructive CAD in the distal left main/LAD distribution.  She underwent angioplasty/PCI to the distal left main/proximal LAD and mid LAD.  During the last office visit she was educated on the importance of improving her modifiable cardiovascular risk factors including diabetes, lipid management and blood pressure.  Her GDMT was currently being uptitrated.  She now presents for 61-monthfollow-up visit.  She is supposed to have labs done prior to today's visit but they are still pending.  Clinically she feels well and no longer has heart failure symptoms.  She is able to ambulate longer distances but she does get dyspnea with over exertional activities.  She denies angina pectoris.  She is compliant with dual antiplatelet therapy.  FUNCTIONAL STATUS: No  structured exercise program or daily routine.   ALLERGIES: No Known Allergies  MEDICATION LIST PRIOR TO VISIT: Current Meds  Medication Sig   aspirin EC 81 MG tablet Take 1 tablet by mouth daily.   atorvastatin (LIPITOR) 40 MG tablet TAKE 1 TABLET BY MOUTH EVERYDAY AT BEDTIME   bisoprolol (ZEBETA) 10 MG tablet Take 10 mg by mouth daily.   dapagliflozin propanediol (FARXIGA) 10 MG TABS tablet Take 1 tablet (10 mg total) by mouth daily.   ezetimibe (ZETIA) 10 MG tablet TAKE 1 TABLET BY MOUTH EVERY DAY   glipiZIDE (GLUCOTROL) 10 MG tablet Take 10 mg by mouth 2 (two) times daily before a meal.   glucose blood (ONETOUCH ULTRA) test strip USE TO TEST 3 TIMES DAILY   HUMALOG MIX 75/25 KWIKPEN (75-25) 100 UNIT/ML KwikPen Inject into the skin.   levothyroxine (SYNTHROID) 88 MCG tablet Take 88 mcg by mouth daily before breakfast.   metFORMIN (GLUCOPHAGE) 1000 MG tablet Take 1 tablet by mouth 2 (two) times a day.   ofloxacin (OCUFLOX) 0.3 % ophthalmic solution Place 1 drop into the right eye daily.   oxybutynin (DITROPAN XL) 15 MG 24 hr tablet Take 15 mg by mouth daily.   prednisoLONE acetate (PRED FORTE) 1 % ophthalmic suspension Place 1 drop into the right eye daily.   sacubitril-valsartan (ENTRESTO) 49-51 MG Take 1 tablet by mouth 2 (two) times daily.   spironolactone (ALDACTONE) 25 MG tablet Take 0.5 tablets (12.5 mg total) by mouth daily.   ticagrelor (BRILINTA) 90 MG TABS tablet Take 1 tablet (90 mg total) by mouth 2 (two) times daily.   venlafaxine XR (EFFEXOR-XR) 150 MG 24 hr capsule Take 150 mg  by mouth daily with breakfast.     PAST MEDICAL HISTORY: Past Medical History:  Diagnosis Date   CHF (congestive heart failure) ([place])    Coronary artery disease    COVID-19    Diabetes mellitus without complication ([place])    Hyperlipidemia    NSTEMI (non-ST elevated myocardial infarction) ([place])    Thyroid disease     PAST SURGICAL HISTORY: Past Surgical History:  Procedure Laterality Date    CORONARY STENT INTERVENTION N/A 02/02/2021   Procedure: CORONARY STENT INTERVENTION;  Surgeon: Adrian Prows, MD;  Location: [place] [facility];  Service: Cardiovascular;  Laterality: N/A;   KNEE SURGERY Left    RIGHT/LEFT HEART CATH AND CORONARY ANGIOGRAPHY N/A 02/02/2021   Procedure: RIGHT/LEFT HEART CATH AND CORONARY ANGIOGRAPHY;  Surgeon: Adrian Prows, MD;  Location: [place] [facility];  Service: Cardiovascular;  Laterality: N/A;    FAMILY HISTORY: The patient family history includes Cancer in her mother; Heart attack in her father and sister; Hyperlipidemia in her mother; Hypertension in her father.  SOCIAL HISTORY:  The patient  reports that she quit smoking about 51 years ago. Her smoking use included cigarettes. She has never used smokeless tobacco. She reports that she does not currently use alcohol.  REVIEW OF SYSTEMS: Review of Systems  Cardiovascular:  Positive for dyspnea on exertion and leg swelling (improved). Negative for chest pain, orthopnea, palpitations, paroxysmal nocturnal dyspnea and syncope.  Respiratory:  Negative for shortness of breath.     PHYSICAL EXAM:    07/14/2021    1:59 PM 07/14/2021    1:58 PM 04/14/2021    2:04 PM  Vitals with BMI  Height  _0  _1   Weight  223 lbs 3 oz 223 lbs  BMI  97.98 92.11  Systolic 941 740 814  Diastolic 62 68 51  Pulse 80 89 91    CONSTITUTIONAL: Appears older than stated age, obese, hemodynamically stable, no acute distress. Currently in wheelchair.  SKIN: Skin is warm and dry. No rash noted. No cyanosis. No pallor. No jaundice HEAD: Normocephalic and atraumatic.  EYES: No scleral icterus MOUTH/THROAT: Moist oral membranes.  NECK: Unable to evaluate JVP due to short neck stature and adipose tissue,  No carotid bruits  CHEST Normal respiratory effort. No intercostal retractions  LUNGS: Clear to auscultation bilaterally.  No stridor. No wheezes. No rales.  CARDIOVASCULAR: Regular rate and rhythm, positive S1-S2, no  murmurs rubs or gallops appreciated. ABDOMINAL: Obese, soft, nontender, nondistended, positive bowel sounds in all 4 quadrants, no apparent ascites.  EXTREMITIES: Trace bilateral peripheral edema, warm to touch, palpable DP and PT pulses HEMATOLOGIC: No significant bruising NEUROLOGIC: Oriented to person, place, and time. Nonfocal. Normal muscle tone.  PSYCHIATRIC: Normal mood and affect. Normal behavior. Cooperative  CARDIAC DATABASE: EKG: 03/03/2021: NSR, 73 bpm, left axis, left anterior fascicular block, LVH per voltage criteria cannot rule out old anterior infarct, TWI suggestive of possible anterolateral ischemia or secondary to repolarization.  Echocardiogram: 02/01/2021:  1. Left ventricular ejection fraction, by estimation, is 30 to 35%. The  left ventricle has moderately decreased function. The left ventricle  demonstrates regional wall motion abnormalities (see scoring  diagram/findings for description). There is mild  left ventricular hypertrophy. Left ventricular diastolic parameters are  consistent with Grade I diastolic dysfunction (impaired relaxation).  Elevated left ventricular end-diastolic pressure. The mid and distal  anterior wall, entire anterior septum,  entire apex, mid and distal inferior wall, and mid inferoseptal segment  are hypokinetic.   2. Right  ventricular systolic function is normal. The right ventricular  size is normal. There is normal pulmonary artery systolic pressure. The  estimated right ventricular systolic pressure is 99.8 mmHg.   3. The mitral valve is degenerative. Mild mitral valve regurgitation. No  evidence of mitral stenosis.   4. The aortic valve was not well visualized. Aortic valve regurgitation  is not visualized. No aortic stenosis is present.   5. The inferior vena cava is dilated in size with <50% respiratory  variability, suggesting right atrial pressure of 15 mmHg.   Conclusion(s)/Recommendation(s): No left ventricular mural or  apical  thrombus/thrombi.   Stress Testing: No results found for this or any previous visit from the past 1095 days.  Heart Catheterization: 02/02/2021:   Mid Cx lesion is 90% stenosed.   Ost Cx to Prox Cx lesion is 30% stenosed.   1st Diag lesion is 99% stenosed.   Prox LAD to Mid LAD lesion is 100% stenosed.   A drug-eluting stent was successfully placed using a STENT ONYX FRONTIER [id number].   A drug-eluting stent was successfully placed using a STENT ONYX FRONTIER [id number].   Post intervention, there is a 0% residual stenosis.   Right and left Heart Catheterization 02/02/21:   RA: 16/14, mean 14 mmHg RV 52/15, EDP 17 mmHg PA 50/22, mean 37 mmHg. PW 27/29, mean 24 mmHg. QP/QS 1.00.  CO 4.2, CI 2.02 by Fick.   LV: 112/15, EDP 29 mmHg.  Ao: 97/60, mean 76 mmHg.  There was no pressure gradient across the aortic valve. LM: Mildly calcified but widely patent. LAD: Large vessel in the proximal segment, moderately calcified, occluded in the midsegment after the origin of a large diagonal 1 which has ostial 99% stenosis. CX: Large vessel, proximal segment has a 30% stenosis, large OM1 with a proximal 20 to 30% stenosis, AV groove has 90 to 95% stenosis in the proximal to mid segment. RCA: Large vessel, mild luminal irregularity.   Interventional data: Successful PTCA and stenting of the mid LAD with implantation of a 2.5 x 26 mm Onyx frontier DES, proximally into the proximal LAD and into the distal LM 82.75 x 12 mm Onyx frontier stent deployed postdilated with 3.5 x 8 mm Burkburnett balloon at 20 atm pressure for 30 seconds x 2.  Complete expansion of the stent into the left main.  No sidebranch jailing of D1 which has ostial 99% stenosis.  TIMI 0 to TIMI-3 flow at the end of the procedure.  110 mL contrast utilized.  Recommendation: Continue aspirin indefinitely and Brilinta for at least a period of 1 year.  Medical therapy for circumflex disease unless she has recurrence of angina pectoris.  Medical  therapy for D1 disease.  LABORATORY DATA:    Latest Ref Rng & Units 02/02/2021    9:51 AM 02/02/2021    9:46 AM 02/02/2021    3:45 AM  CBC  WBC 4.0 - 10.5 K/uL   10.1   Hemoglobin 12.0 - 15.0 g/dL 10.2  10.2  10.5   Hematocrit 36.0 - 46.0 % 30.0  30.0  32.7   Platelets 150 - 400 K/uL   268        Latest Ref Rng & Units 03/05/2021    3:01 PM 02/06/2021    3:31 AM 02/05/2021    1:04 AM  CMP  Glucose 70 - 99 mg/dL 206  149  256   BUN 8 - 27 mg/dL 24  49  57   Creatinine 0.57 - 1.00 mg/dL  1.03  1.41  1.50   Sodium 134 - 144 mmol/L 140  141  138   Potassium 3.5 - 5.2 mmol/L 3.9  4.1  4.2   Chloride 96 - 106 mmol/L 102  105  103   CO2 20 - 29 mmol/L _0 Calcium 8.7 - 10.3 mg/dL 9.5  9.0  9.1   Total Protein 6.0 - 8.5 g/dL 7.4  7.1  7.0   Total Bilirubin 0.0 - 1.2 mg/dL 0.4  0.5  0.2   Alkaline Phos 44 - 121 IU/L 129  303  306   AST 0 - 40 IU/L 16  126  124   ALT 0 - 32 IU/L 13  153  126     Lipid Panel     Component Value Date/Time   CHOL 140 02/02/2021 0345   TRIG 120 02/02/2021 0345   HDL 37 (L) 02/02/2021 0345   CHOLHDL 3.8 02/02/2021 0345   VLDL 24 02/02/2021 0345   LDLCALC 79 02/02/2021 0345    No components found for: "NTPROBNP" Recent Labs    03/05/21 1501  PROBNP 5,947*   Recent Labs    02/01/21 1959  TSH 3.953    BMP Recent Labs    02/04/21 0426 02/05/21 0104 02/06/21 0331 03/05/21 1501  NA 138 138 141 140  K 4.1 4.2 4.1 3.9  CL 104 103 105 102  CO2 21* 22 21* 21  GLUCOSE 217* 256* 149* 206*  BUN 53* 57* 49* 24  CREATININE 1.63* 1.50* 1.41* 1.03*  CALCIUM 9.0 9.1 9.0 9.5  GFRNONAA 34* 37* 40*  --     HEMOGLOBIN A1C Lab Results  Component Value Date   HGBA1C 12.0 (H) 02/01/2021   MPG 297.7 02/01/2021    IMPRESSION:    ICD-10-CM   1. Chronic combined systolic and diastolic CHF (congestive heart failure) (HCC)  I50.42 PCV ECHOCARDIOGRAM COMPLETE    2. Ischemic cardiomyopathy  I25.5 PCV ECHOCARDIOGRAM COMPLETE    3. History of  non-ST elevation myocardial infarction (NSTEMI)  I25.2     4. S/P primary angioplasty with coronary stent  Z95.5     5. Atherosclerosis of native coronary artery of native heart without angina pectoris  I25.10     6. History of COVID-19  Z86.16     7. Type 2 diabetes mellitus with hyperglycemia, with long-term current use of insulin (HCC)  E11.65    Z79.4     8. Long-term insulin use (HCC)  Z79.4     9. Class 3 severe obesity due to excess calories with serious comorbidity and body mass index (BMI) of 40.0 to 44.9 in adult [facility])  E66.01    Z68.41        RECOMMENDATIONS: Mallory Butler is a 71 y.o. Caucasian female whose past medical history and cardiac risk factors include: CAD status post angioplasty/PCI (distal LM/proximal LAD, and mid LAD), ischemic cardiomyopathy, acute on chronic systolic and diastolic heart failure, insulin-dependent diabetes mellitus type 2, hypertension, hyperlipidemia, postmenopausal female, obesity due to excess calories.  Chronic combined systolic and diastolic CHF (congestive heart failure) (HCC) Stage C, NYHA class II.  Appears to be better compensated compared to last office visit. Medications reconciled. We will hold off on up titration of GDMT until labs are available to reevaluate her renal function and NT proBNP. Strict I's and O's, daily weights.  Repeat echocardiogram prior to next office visit to reevaluate LVEF.  Ischemic cardiomyopathy See above  History of NSTEMI /  s/p primary angioplasty with coronary stent / Atherosclerosis of native coronary artery of native heart without angina pectoris Denies angina pectoris. Medications reconciled. Continue dual antiplatelet therapy for at least 1 year given her initial presentation of ACS.  We will repeat echocardiogram to reevaluate LVEF as discussed above. Educated on importance of improving her modifiable cardiovascular risk factors.  Type 2 diabetes mellitus with hyperglycemia, with long-term  current use of insulin (HCC) / Long-term insulin use (HCC) Currently on insulin therapy. Continue Farxiga, statin therapy, ARNI, metformin Reemphasized the importance of glycemic control.  Class 3 severe obesity due to excess calories with serious comorbidity and body mass index (BMI) of 40.0 to 44.9 in adult [facility]) Body mass index is 39.54 kg/m. I reviewed with the patient the importance of diet, regular physical activity/exercise, weight loss.   Patient is educated on increasing physical activity gradually as tolerated.  With the goal of moderate intensity exercise for 30 minutes a day 5 days a week.  FINAL MEDICATION LIST END OF ENCOUNTER: No orders of the defined types were placed in this encounter.   Medications Discontinued During This Encounter  Medication Reason   insulin aspart protamine - aspart (NOVOLOG MIX 70/30 FLEXPEN) (70-30) 100 UNIT/ML FlexPen Patient Preference     Current Outpatient Medications:    aspirin EC 81 MG tablet, Take 1 tablet by mouth daily., Disp: , Rfl:    atorvastatin (LIPITOR) 40 MG tablet, TAKE 1 TABLET BY MOUTH EVERYDAY AT BEDTIME, Disp: 90 tablet, Rfl: 0   bisoprolol (ZEBETA) 10 MG tablet, Take 10 mg by mouth daily., Disp: , Rfl:    dapagliflozin propanediol (FARXIGA) 10 MG TABS tablet, Take 1 tablet (10 mg total) by mouth daily., Disp: 30 tablet, Rfl: 0   ezetimibe (ZETIA) 10 MG tablet, TAKE 1 TABLET BY MOUTH EVERY DAY, Disp: 90 tablet, Rfl: 0   glipiZIDE (GLUCOTROL) 10 MG tablet, Take 10 mg by mouth 2 (two) times daily before a meal., Disp: , Rfl:    glucose blood (ONETOUCH ULTRA) test strip, USE TO TEST 3 TIMES DAILY, Disp: , Rfl:    HUMALOG MIX 75/25 KWIKPEN (75-25) 100 UNIT/ML KwikPen, Inject into the skin., Disp: , Rfl:    levothyroxine (SYNTHROID) 88 MCG tablet, Take 88 mcg by mouth daily before breakfast., Disp: , Rfl:    metFORMIN (GLUCOPHAGE) 1000 MG tablet, Take 1 tablet by mouth 2 (two) times a day., Disp: , Rfl:    ofloxacin (OCUFLOX) 0.3  % ophthalmic solution, Place 1 drop into the right eye daily., Disp: , Rfl:    oxybutynin (DITROPAN XL) 15 MG 24 hr tablet, Take 15 mg by mouth daily., Disp: , Rfl:    prednisoLONE acetate (PRED FORTE) 1 % ophthalmic suspension, Place 1 drop into the right eye daily., Disp: , Rfl:    sacubitril-valsartan (ENTRESTO) 49-51 MG, Take 1 tablet by mouth 2 (two) times daily., Disp: 60 tablet, Rfl: 3   spironolactone (ALDACTONE) 25 MG tablet, Take 0.5 tablets (12.5 mg total) by mouth daily., Disp: 90 tablet, Rfl: 0   ticagrelor (BRILINTA) 90 MG TABS tablet, Take 1 tablet (90 mg total) by mouth 2 (two) times daily., Disp: 60 tablet, Rfl: 2   venlafaxine XR (EFFEXOR-XR) 150 MG 24 hr capsule, Take 150 mg by mouth daily with breakfast., Disp: , Rfl:    Multiple Vitamin (MULTIVITAMIN) capsule, Take 1 capsule by mouth daily., Disp: , Rfl:   Orders Placed This Encounter  Procedures   PCV ECHOCARDIOGRAM COMPLETE    There are  no Patient Instructions on file for this visit.   --Continue cardiac medications as reconciled in final medication list. --Return in about 3 months (around 10/14/2021) for Follow up, heart failure management.. Or sooner if needed. --Continue follow-up with your primary care physician regarding the management of your other chronic comorbid conditions.  Patient's questions and concerns were addressed to her satisfaction. She voices understanding of the instructions provided during this encounter.   This note was created using a voice recognition software as a result there may be grammatical errors inadvertently enclosed that do not reflect the nature of this encounter. Every attempt is made to correct such errors.  Rex Kras, [place], [facility]  Pager: [phone] Office: [phone]

## 2021-07-17 ENCOUNTER — Other Ambulatory Visit: Payer: Self-pay

## 2021-07-17 DIAGNOSIS — I5042 Chronic combined systolic (congestive) and diastolic (congestive) heart failure: Secondary | ICD-10-CM

## 2021-07-17 LAB — CMP14+EGFR
ALT: 10 IU/L (ref 0–32)
AST: 14 IU/L (ref 0–40)
Albumin/Globulin Ratio: 1.4 (ref 1.2–2.2)
Albumin: 4 g/dL (ref 3.9–4.9)
Alkaline Phosphatase: 90 IU/L (ref 44–121)
BUN/Creatinine Ratio: 28 (ref 12–28)
BUN: 22 mg/dL (ref 8–27)
Bilirubin Total: 0.3 mg/dL (ref 0.0–1.2)
CO2: 26 mmol/L (ref 20–29)
Calcium: 9.4 mg/dL (ref 8.7–10.3)
Chloride: 100 mmol/L (ref 96–106)
Creatinine, Ser: 0.79 mg/dL (ref 0.57–1.00)
Globulin, Total: 2.8 g/dL (ref 1.5–4.5)
Glucose: 137 mg/dL — ABNORMAL HIGH (ref 70–99)
Potassium: 4.7 mmol/L (ref 3.5–5.2)
Sodium: 141 mmol/L (ref 134–144)
Total Protein: 6.8 g/dL (ref 6.0–8.5)
eGFR: 80 mL/min/{1.73_m2} (ref >59)

## 2021-07-17 LAB — LIPID PANEL WITH LDL/HDL RATIO
Cholesterol, Total: 131 mg/dL (ref 100–199)
HDL: 55 mg/dL (ref >39)
LDL Chol Calc (NIH): 55 mg/dL (ref 0–99)
LDL/HDL Ratio: 1 ratio (ref 0.0–3.2)
Triglycerides: 119 mg/dL (ref 0–149)
VLDL Cholesterol Cal: 21 mg/dL (ref 5–40)

## 2021-07-17 LAB — LDL CHOLESTEROL, DIRECT: LDL Direct: 51 mg/dL (ref 0–99)

## 2021-07-17 LAB — PRO B NATRIURETIC PEPTIDE: NT-Pro BNP: 624 pg/mL — ABNORMAL HIGH (ref 0–301)

## 2021-07-17 MED ORDER — SACUBITRIL-VALSARTAN 97-103 MG PO TABS: 1.0000 | ORAL_TABLET | Freq: Two times a day (BID) | ORAL | 0 refills | Status: DC

## 2021-07-17 NOTE — Progress Notes (Signed)
Called and spoke to patient she voiced understanding medication has been sent and orders have been placed and released

## 2021-07-18 ENCOUNTER — Other Ambulatory Visit: Payer: Self-pay | Admitting: Cardiology

## 2021-07-18 DIAGNOSIS — I252 Old myocardial infarction: Secondary | ICD-10-CM

## 2021-07-18 DIAGNOSIS — I255 Ischemic cardiomyopathy: Secondary | ICD-10-CM

## 2021-07-18 DIAGNOSIS — I2510 Atherosclerotic heart disease of native coronary artery without angina pectoris: Secondary | ICD-10-CM

## 2021-07-18 DIAGNOSIS — Z955 Presence of coronary angioplasty implant and graft: Secondary | ICD-10-CM

## 2021-07-31 ENCOUNTER — Other Ambulatory Visit: Payer: Self-pay

## 2021-07-31 DIAGNOSIS — I5042 Chronic combined systolic (congestive) and diastolic (congestive) heart failure: Secondary | ICD-10-CM

## 2021-07-31 NOTE — Progress Notes (Signed)
Tried calling patient no answer left a vm to call back

## 2021-08-03 NOTE — Progress Notes (Signed)
Pt called back and was informed about her lab results and to increase her entresto.

## 2021-08-08 ENCOUNTER — Other Ambulatory Visit: Payer: Self-pay | Admitting: Cardiology

## 2021-08-11 ENCOUNTER — Other Ambulatory Visit: Payer: Self-pay

## 2021-08-11 DIAGNOSIS — I5042 Chronic combined systolic (congestive) and diastolic (congestive) heart failure: Secondary | ICD-10-CM

## 2021-08-11 NOTE — Progress Notes (Signed)
No she was going to get them done this week

## 2021-08-15 ENCOUNTER — Other Ambulatory Visit: Payer: Self-pay | Admitting: Cardiology

## 2021-08-22 ENCOUNTER — Other Ambulatory Visit: Payer: Self-pay | Admitting: Cardiology

## 2021-09-04 NOTE — Progress Notes (Signed)
Called patient no answer left a vm

## 2021-10-01 ENCOUNTER — Other Ambulatory Visit: Payer: Self-pay

## 2021-10-01 ENCOUNTER — Encounter (HOSPITAL_COMMUNITY): Payer: Self-pay

## 2021-10-01 ENCOUNTER — Inpatient Hospital Stay (HOSPITAL_COMMUNITY)
Admission: EM | Admit: 2021-10-01 | Discharge: 2021-10-10 | DRG: 871 | Disposition: A | Discharge status: Skilled Nursing Facility | Payer: Medicare Other | Attending: Internal Medicine | Admitting: Internal Medicine

## 2021-10-01 ENCOUNTER — Emergency Department (HOSPITAL_COMMUNITY): Payer: Medicare Other

## 2021-10-01 DIAGNOSIS — E11649 Type 2 diabetes mellitus with hypoglycemia without coma: Secondary | ICD-10-CM | POA: Diagnosis not present

## 2021-10-01 DIAGNOSIS — E1159 Type 2 diabetes mellitus with other circulatory complications: Secondary | ICD-10-CM | POA: Diagnosis present

## 2021-10-01 DIAGNOSIS — L0231 Cutaneous abscess of buttock: Secondary | ICD-10-CM | POA: Diagnosis present

## 2021-10-01 DIAGNOSIS — Z8616 Personal history of COVID-19: Secondary | ICD-10-CM

## 2021-10-01 DIAGNOSIS — N1832 Chronic kidney disease, stage 3b: Secondary | ICD-10-CM | POA: Diagnosis present

## 2021-10-01 DIAGNOSIS — A419 Sepsis, unspecified organism: Principal | ICD-10-CM | POA: Diagnosis present

## 2021-10-01 DIAGNOSIS — T502X5A Adverse effect of carbonic-anhydrase inhibitors, benzothiadiazides and other diuretics, initial encounter: Secondary | ICD-10-CM | POA: Diagnosis present

## 2021-10-01 DIAGNOSIS — I5042 Chronic combined systolic (congestive) and diastolic (congestive) heart failure: Secondary | ICD-10-CM | POA: Diagnosis present

## 2021-10-01 DIAGNOSIS — I252 Old myocardial infarction: Secondary | ICD-10-CM

## 2021-10-01 DIAGNOSIS — R739 Hyperglycemia, unspecified: Secondary | ICD-10-CM

## 2021-10-01 DIAGNOSIS — Z87891 Personal history of nicotine dependence: Secondary | ICD-10-CM

## 2021-10-01 DIAGNOSIS — R6520 Severe sepsis without septic shock: Secondary | ICD-10-CM

## 2021-10-01 DIAGNOSIS — E871 Hypo-osmolality and hyponatremia: Secondary | ICD-10-CM | POA: Diagnosis present

## 2021-10-01 DIAGNOSIS — N170 Acute kidney failure with tubular necrosis: Secondary | ICD-10-CM | POA: Diagnosis present

## 2021-10-01 DIAGNOSIS — E1110 Type 2 diabetes mellitus with ketoacidosis without coma: Secondary | ICD-10-CM | POA: Diagnosis present

## 2021-10-01 DIAGNOSIS — I21A1 Myocardial infarction type 2: Secondary | ICD-10-CM | POA: Diagnosis present

## 2021-10-01 DIAGNOSIS — I480 Paroxysmal atrial fibrillation: Secondary | ICD-10-CM | POA: Diagnosis present

## 2021-10-01 DIAGNOSIS — E039 Hypothyroidism, unspecified: Secondary | ICD-10-CM | POA: Diagnosis present

## 2021-10-01 DIAGNOSIS — E119 Type 2 diabetes mellitus without complications: Secondary | ICD-10-CM

## 2021-10-01 DIAGNOSIS — Z7902 Long term (current) use of antithrombotics/antiplatelets: Secondary | ICD-10-CM

## 2021-10-01 DIAGNOSIS — E1169 Type 2 diabetes mellitus with other specified complication: Secondary | ICD-10-CM | POA: Diagnosis present

## 2021-10-01 DIAGNOSIS — E876 Hypokalemia: Secondary | ICD-10-CM | POA: Diagnosis present

## 2021-10-01 DIAGNOSIS — Z6841 Body Mass Index (BMI) 40.0 and over, adult: Secondary | ICD-10-CM

## 2021-10-01 DIAGNOSIS — Z79899 Other long term (current) drug therapy: Secondary | ICD-10-CM

## 2021-10-01 DIAGNOSIS — Z8249 Family history of ischemic heart disease and other diseases of the circulatory system: Secondary | ICD-10-CM

## 2021-10-01 DIAGNOSIS — Z794 Long term (current) use of insulin: Secondary | ICD-10-CM | POA: Diagnosis not present

## 2021-10-01 DIAGNOSIS — J9601 Acute respiratory failure with hypoxia: Secondary | ICD-10-CM | POA: Diagnosis not present

## 2021-10-01 DIAGNOSIS — I130 Hypertensive heart and chronic kidney disease with heart failure and stage 1 through stage 4 chronic kidney disease, or unspecified chronic kidney disease: Secondary | ICD-10-CM | POA: Diagnosis present

## 2021-10-01 DIAGNOSIS — E785 Hyperlipidemia, unspecified: Secondary | ICD-10-CM | POA: Diagnosis present

## 2021-10-01 DIAGNOSIS — R197 Diarrhea, unspecified: Secondary | ICD-10-CM | POA: Diagnosis present

## 2021-10-01 DIAGNOSIS — Z7982 Long term (current) use of aspirin: Secondary | ICD-10-CM

## 2021-10-01 DIAGNOSIS — Z7984 Long term (current) use of oral hypoglycemic drugs: Secondary | ICD-10-CM

## 2021-10-01 DIAGNOSIS — F419 Anxiety disorder, unspecified: Secondary | ICD-10-CM | POA: Diagnosis present

## 2021-10-01 DIAGNOSIS — I4891 Unspecified atrial fibrillation: Secondary | ICD-10-CM | POA: Diagnosis not present

## 2021-10-01 DIAGNOSIS — L03317 Cellulitis of buttock: Secondary | ICD-10-CM | POA: Diagnosis present

## 2021-10-01 DIAGNOSIS — Z955 Presence of coronary angioplasty implant and graft: Secondary | ICD-10-CM

## 2021-10-01 DIAGNOSIS — F32A Depression, unspecified: Secondary | ICD-10-CM | POA: Diagnosis present

## 2021-10-01 DIAGNOSIS — I152 Hypertension secondary to endocrine disorders: Secondary | ICD-10-CM | POA: Diagnosis present

## 2021-10-01 DIAGNOSIS — Z79890 Hormone replacement therapy (postmenopausal): Secondary | ICD-10-CM

## 2021-10-01 DIAGNOSIS — N179 Acute kidney failure, unspecified: Secondary | ICD-10-CM | POA: Diagnosis not present

## 2021-10-01 DIAGNOSIS — I2510 Atherosclerotic heart disease of native coronary artery without angina pectoris: Secondary | ICD-10-CM | POA: Diagnosis present

## 2021-10-01 DIAGNOSIS — R6521 Severe sepsis with septic shock: Secondary | ICD-10-CM | POA: Diagnosis not present

## 2021-10-01 DIAGNOSIS — E1122 Type 2 diabetes mellitus with diabetic chronic kidney disease: Secondary | ICD-10-CM | POA: Diagnosis present

## 2021-10-01 DIAGNOSIS — E118 Type 2 diabetes mellitus with unspecified complications: Secondary | ICD-10-CM | POA: Diagnosis not present

## 2021-10-01 DIAGNOSIS — K5900 Constipation, unspecified: Secondary | ICD-10-CM | POA: Diagnosis not present

## 2021-10-01 DIAGNOSIS — Z83438 Family history of other disorder of lipoprotein metabolism and other lipidemia: Secondary | ICD-10-CM

## 2021-10-01 LAB — COMPREHENSIVE METABOLIC PANEL
ALT: 19 U/L (ref 0–44)
AST: 30 U/L (ref 15–41)
Albumin: 2.4 g/dL — ABNORMAL LOW (ref 3.5–5.0)
Alkaline Phosphatase: 98 U/L (ref 38–126)
Anion gap: 19 — ABNORMAL HIGH (ref 5–15)
BUN: 58 mg/dL — ABNORMAL HIGH (ref 8–23)
CO2: 21 mmol/L — ABNORMAL LOW (ref 22–32)
Calcium: 8.8 mg/dL — ABNORMAL LOW (ref 8.9–10.3)
Chloride: 91 mmol/L — ABNORMAL LOW (ref 98–111)
Creatinine, Ser: 2.02 mg/dL — ABNORMAL HIGH (ref 0.44–1.00)
GFR, Estimated: 26 mL/min — ABNORMAL LOW (ref >60)
Glucose, Bld: 465 mg/dL — ABNORMAL HIGH (ref 70–99)
Potassium: 4.5 mmol/L (ref 3.5–5.1)
Sodium: 131 mmol/L — ABNORMAL LOW (ref 135–145)
Total Bilirubin: 0.7 mg/dL (ref 0.3–1.2)
Total Protein: 7.2 g/dL (ref 6.5–8.1)

## 2021-10-01 LAB — CBC WITH DIFFERENTIAL/PLATELET
Abs Immature Granulocytes: 0 10*3/uL (ref 0.00–0.07)
Basophils Absolute: 0 10*3/uL (ref 0.0–0.1)
Basophils Relative: 0 %
Eosinophils Absolute: 0 10*3/uL (ref 0.0–0.5)
Eosinophils Relative: 0 %
HCT: 35.1 % — ABNORMAL LOW (ref 36.0–46.0)
Hemoglobin: 11.6 g/dL — ABNORMAL LOW (ref 12.0–15.0)
Lymphocytes Relative: 16 %
Lymphs Abs: 2.9 10*3/uL (ref 0.7–4.0)
MCH: 32.4 pg (ref 26.0–34.0)
MCHC: 33 g/dL (ref 30.0–36.0)
MCV: 98 fL (ref 80.0–100.0)
Monocytes Absolute: 0.7 10*3/uL (ref 0.1–1.0)
Monocytes Relative: 4 %
Neutro Abs: 14.3 10*3/uL — ABNORMAL HIGH (ref 1.7–7.7)
Neutrophils Relative %: 80 %
Platelets: 296 10*3/uL (ref 150–400)
RBC: 3.58 MIL/uL — ABNORMAL LOW (ref 3.87–5.11)
RDW: 12.5 % (ref 11.5–15.5)
WBC: 17.9 10*3/uL — ABNORMAL HIGH (ref 4.0–10.5)
nRBC: 0 % (ref 0.0–0.2)
nRBC: 0 /100 WBC

## 2021-10-01 LAB — PROTIME-INR
INR: 1.3 — ABNORMAL HIGH (ref 0.8–1.2)
Prothrombin Time: 15.6 seconds — ABNORMAL HIGH (ref 11.4–15.2)

## 2021-10-01 LAB — LACTIC ACID, PLASMA: Lactic Acid, Venous: 3.9 mmol/L (ref 0.5–1.9)

## 2021-10-01 MED ORDER — SODIUM CHLORIDE 0.9 % IV SOLN
2.0000 g | Freq: Once | INTRAVENOUS | Status: AC
  Administered 2021-10-01: 2 g via INTRAVENOUS
  Filled 2021-10-01: qty 12.5

## 2021-10-01 MED ORDER — ONDANSETRON HCL 4 MG/2ML IJ SOLN
4.0000 mg | Freq: Once | INTRAMUSCULAR | Status: AC
  Administered 2021-10-01: 4 mg via INTRAVENOUS
  Filled 2021-10-01: qty 2

## 2021-10-01 MED ORDER — LACTATED RINGERS IV BOLUS
1000.0000 mL | Freq: Once | INTRAVENOUS | Status: AC
  Administered 2021-10-01: 1000 mL via INTRAVENOUS

## 2021-10-01 MED ORDER — VANCOMYCIN HCL IN DEXTROSE 1-5 GM/200ML-% IV SOLN: 1000.0000 mg | Freq: Once | INTRAVENOUS | Status: DC

## 2021-10-01 MED ORDER — LACTATED RINGERS IV SOLN: INTRAVENOUS | Status: DC

## 2021-10-01 MED ORDER — FENTANYL CITRATE PF 50 MCG/ML IJ SOSY
50.0000 ug | PREFILLED_SYRINGE | Freq: Once | INTRAMUSCULAR | Status: AC
  Administered 2021-10-01: 50 ug via INTRAVENOUS
  Filled 2021-10-01: qty 1

## 2021-10-01 MED ORDER — OXYCODONE-ACETAMINOPHEN 5-325 MG PO TABS
1.0000 | ORAL_TABLET | Freq: Once | ORAL | Status: AC
  Administered 2021-10-01: 1 via ORAL
  Filled 2021-10-01: qty 1

## 2021-10-01 MED ORDER — VANCOMYCIN HCL 2000 MG/400ML IV SOLN
2000.0000 mg | Freq: Once | INTRAVENOUS | Status: AC
  Administered 2021-10-02: 2000 mg via INTRAVENOUS
  Filled 2021-10-01: qty 400

## 2021-10-01 NOTE — ED Provider Notes (Signed)
[facility]GENC[facility] DEPARTMENT Provider Note   CSN: [id number] Arrival date & time: 10/01/21  1703     History  Chief Complaint  Patient presents with  . Abscess  . Fever    Mallory Butler is a 71 y.o. female.  Patient is a 71 year old female with a past medical history of CAD status post stent placement, CHF with a EF of 30 to 35%, diabetes, hypertension, hypothyroidism presenting to the emergency department with concern for an abscess to her right buttock.  She states that about 4 days ago she initially noticed what felt like a pimple and is approximately the size of a pea.  She states it was painful to touch.  She states over the last few days it has been getting much bigger in size and she has had fevers for the last 2 days.  She states she has had associated nausea and diarrhea.  She denies any abdominal pain.  The history is provided by the patient.  Abscess Associated symptoms: fever   Fever      Home Medications Prior to Admission medications   Medication Sig Start Date End Date Taking? Authorizing Provider  aspirin EC 81 MG tablet Take 1 tablet by mouth daily. 07/14/15   [provider]  atorvastatin (LIPITOR) 40 MG tablet TAKE 1 TABLET BY MOUTH EVERYDAY AT BEDTIME 07/20/21   Tolia, Sunit, DO  bisoprolol (ZEBETA) 10 MG tablet Take 10 mg by mouth daily.    [provider]  BRILINTA 90 MG TABS tablet TAKE 1 TABLET BY MOUTH 2 TIMES DAILY. 08/17/21   Tolia, Sunit, DO  dapagliflozin propanediol (FARXIGA) 10 MG TABS tablet Take 1 tablet (10 mg total) by mouth daily. 02/07/21   Domenic Polite, MD  ezetimibe (ZETIA) 10 MG tablet TAKE 1 TABLET BY MOUTH EVERY DAY 07/20/21   Tolia, Sunit, DO  glipiZIDE (GLUCOTROL) 10 MG tablet Take 10 mg by mouth 2 (two) times daily before a meal. 05/30/18   [provider]  glucose blood (ONETOUCH ULTRA) test strip USE TO TEST 3 TIMES DAILY 06/27/17   [provider]  HUMALOG MIX 75/25 KWIKPEN  (75-25) 100 UNIT/ML KwikPen Inject into the skin. 03/17/21   [provider]  levothyroxine (SYNTHROID) 88 MCG tablet Take 88 mcg by mouth daily before breakfast. 07/05/18   [provider]  metFORMIN (GLUCOPHAGE) 1000 MG tablet Take 1 tablet by mouth 2 (two) times a day. 04/28/18   [provider]  Multiple Vitamin (MULTIVITAMIN) capsule Take 1 capsule by mouth daily.    [provider]  ofloxacin (OCUFLOX) 0.3 % ophthalmic solution Place 1 drop into the right eye daily. 05/29/21   [provider]  oxybutynin (DITROPAN XL) 15 MG 24 hr tablet Take 15 mg by mouth daily. 03/01/21   [provider]  prednisoLONE acetate (PRED FORTE) 1 % ophthalmic suspension Place 1 drop into the right eye daily. 06/09/21   [provider]  sacubitril-valsartan (ENTRESTO) 97-103 MG Take 1 tablet by mouth 2 (two) times daily. 07/17/21   Tolia, Sunit, DO  spironolactone (ALDACTONE) 25 MG tablet TAKE 1/2 TABLET BY MOUTH EVERY DAY 08/24/21   Tolia, Sunit, DO  venlafaxine XR (EFFEXOR-XR) 150 MG 24 hr capsule Take 150 mg by mouth daily with breakfast. 07/03/18   [provider]      Allergies    Patient has no known allergies.    Review of Systems   Review of Systems  Constitutional:  Positive for fever.  Physical Exam Updated Vital Signs BP (!) 99/58   Pulse 82   Temp 97.9 F (36.6 C) (Oral)   Resp 18   SpO2 98%  Physical Exam Vitals and nursing note reviewed.  Constitutional:      General: She is not in acute distress.    Appearance: Normal appearance. She is obese.  HENT:     Head: Normocephalic and atraumatic.     Nose: Nose normal.     Mouth/Throat:     Mouth: Mucous membranes are moist.     Pharynx: Oropharynx is clear.  Eyes:     Extraocular Movements: Extraocular movements intact.     Conjunctiva/sclera: Conjunctivae normal.  Cardiovascular:     Rate and Rhythm: Normal rate and regular rhythm.     Pulses: Normal pulses.      Heart sounds: Normal heart sounds.  Pulmonary:     Effort: Pulmonary effort is normal.     Breath sounds: Normal breath sounds.  Abdominal:     General: Abdomen is flat.     Palpations: Abdomen is soft.     Tenderness: There is no abdominal tenderness.  Musculoskeletal:     Cervical back: Normal range of motion and neck supple.  Skin:    Comments: Approximately 2 cm area of stage I ulceration with significant surrounding erythema, warmth and induration of the right buttock, no palpable fluctuance, no drainage  Neurological:     General: No focal deficit present.     Mental Status: She is alert and oriented to person, place, and time.  Psychiatric:        Mood and Affect: Mood normal.     ED Results / Procedures / Treatments   Labs (all labs ordered are listed, but only abnormal results are displayed) Labs Reviewed  COMPREHENSIVE METABOLIC PANEL - Abnormal; Notable for the following components:      Result Value   Sodium 131 (*)    Chloride 91 (*)    CO2 21 (*)    Glucose, Bld 465 (*)    BUN 58 (*)    Creatinine, Ser 2.02 (*)    Calcium 8.8 (*)    Albumin 2.4 (*)    GFR, Estimated 26 (*)    Anion gap 19 (*)    All other components within normal limits  LACTIC ACID, PLASMA - Abnormal; Notable for the following components:   Lactic Acid, Venous 3.9 (*)    All other components within normal limits  CBC WITH DIFFERENTIAL/PLATELET - Abnormal; Notable for the following components:   WBC 17.9 (*)    RBC 3.58 (*)    Hemoglobin 11.6 (*)    HCT 35.1 (*)    Neutro Abs 14.3 (*)    All other components within normal limits  PROTIME-INR - Abnormal; Notable for the following components:   Prothrombin Time 15.6 (*)    INR 1.3 (*)    All other components within normal limits  CULTURE, BLOOD (ROUTINE X 2)  CULTURE, BLOOD (ROUTINE X 2)  LACTIC ACID, PLASMA  URINALYSIS, ROUTINE W REFLEX MICROSCOPIC  BETA-HYDROXYBUTYRIC ACID    EKG None  Radiology CT PELVIS WO  CONTRAST  Result Date: 10/01/2021 CLINICAL DATA:  Soft tissue infection suspected; right buttock mass EXAM: CT PELVIS WITHOUT CONTRAST TECHNIQUE: Multidetector CT imaging of the pelvis was performed following the standard protocol without intravenous contrast. RADIATION DOSE REDUCTION: This exam was performed according to the departmental dose-optimization program which includes automated exposure control, adjustment of the mA and/or kV  according to patient size and/or use of iterative reconstruction technique. COMPARISON:  None Available. FINDINGS: Urinary Tract:  No abnormality visualized. Bowel: Small bowel herniation into a large fat containing ventral abdominal wall hernia. No evidence of obstruction. Colonic diverticulosis without diverticulitis. Vascular/Lymphatic: No pathologically enlarged lymph nodes. No significant vascular abnormality seen. Reproductive:  No mass or other significant abnormality Other:  None. Musculoskeletal: Extensive soft tissue stranding with a few foci of more confluence fluid. No organized fluid collection or abscess. No soft tissue gas. Mild enlargement and edema within the right gluteus maximus muscle. No acute fracture.  No evidence of osteomyelitis. IMPRESSION: Findings compatible with cellulitis and developing phlegmon in the right buttock subcutaneous fat. Edema within the right gluteus maximus concerning for infectious myositis. No drainable fluid collection. Electronically Signed   By: Placido Sou M.D.   On: 10/01/2021 22:47   DG Chest 2 View  Result Date: 10/01/2021 CLINICAL DATA:  Suspected sepsis EXAM: CHEST - 2 VIEW COMPARISON:  02/01/2021 FINDINGS: The heart size and mediastinal contours are within normal limits. Both lungs are clear. The visualized skeletal structures are unremarkable. IMPRESSION: No active cardiopulmonary disease. Electronically Signed   By: Rolm Baptise M.D.   On: 10/01/2021 20:35    Procedures Procedures    Medications Ordered in  ED Medications  vancomycin (VANCOCIN) IVPB 1000 mg/200 mL premix (has no administration in time range)  ceFEPIme (MAXIPIME) 2 g in sodium chloride 0.9 % 100 mL IVPB (has no administration in time range)  lactated ringers infusion (has no administration in time range)  oxyCODONE-acetaminophen (PERCOCET/ROXICET) 5-325 MG per tablet 1 tablet (1 tablet Oral Given 10/01/21 1954)  lactated ringers bolus 1,000 mL (1,000 mLs Intravenous New Bag/Given 10/01/21 2304)  ondansetron (ZOFRAN) injection 4 mg (4 mg Intravenous Given 10/01/21 2303)  fentaNYL (SUBLIMAZE) injection 50 mcg (50 mcg Intravenous Given 10/01/21 2305)    ED Course/ Medical Decision Making/ A&P Clinical Course as of 10/01/21 2341  Thu Oct 01, 2021  2026 WBC(!): 17.9 [GL]  2256 CT imaging with evidence of cellulitis and phlegmon, no drainable fluid collection and possible infectious myositis.  Will require admission. [VK]  2310 Lactate is 3.9.  She is currently receiving fluids and will receive significant fluids with her antibiotics.  She will be closely monitored for continued fluid resuscitation in the setting of her CHF. [VK]  2332 Patient signed out Dr. Posey Pronto hospitalist. General surgery will also be consulted for possible infectious myositis for additional recommendations. [VK]  2340 I spoke with Dr. Dema Severin of general surgery who states that if there is no drainable collection that they agree with broad-spectrum antibiotics.  They recommend repeat imaging if she is having a worsening clinical course and not improving with antibiotics. [VK]    Clinical Course User Index [GL] Loeffler, Adora Fridge, PA-C [VK] Kemper Durie, DO                           Medical Decision Making This patient presents to the ED with chief complaint(s) of abscess with pertinent past medical history of diabetes, hypertension, CHF, CAD which further complicates the presenting complaint. The complaint involves an extensive differential diagnosis and also  carries with it a high risk of complications and morbidity.    The differential diagnosis includes abscess, cellulitis, no evidence of necrotizing fasciitis on exam, DKA, dehydration, sepsis  Additional history obtained: Additional history obtained from N/A Records reviewed Primary Care Documents and previous labs  ED  Course and Reassessment: Patient was initially evaluated by provider in triage and had labs and CT scan performed.  The patient was noted to be hypotensive in triage to the 80s over 50s and has been afebrile since she has been here.  Labs reveal an AKI with a creatinine of 2 baseline of 0.8, hyperglycemia with a glucose in the 400s and elevated anion gap of 19.  Lactate is pending and BHB will additionally be sent to evaluate for lactic acidosis versus DKA.  The patient does have significant erythema and warmth to her right buttock, concerning for sepsis from cellulitis.  She will be given broad-spectrum antibiotics.  She will be given for gentle fluid resuscitation in the setting of CHF.  CT read is pending at this time  Independent labs interpretation:  The following labs were independently interpreted: Hyperglycemia, AKI, anion gap metabolic acidosis  Independent visualization of imaging: - I independently visualized the following imaging with scope of interpretation limited to determining acute life threatening conditions related to emergency care: CT AP, which revealed cellulitis with possible infectious myositis, no drainable fluid collection  Consultation: - Consulted or discussed management/test interpretation w/ external professional: Hospitalist  Consideration for admission or further workup: Requires admission for her sepsis secondary to her cellulitis Social Determinants of health: N/A    Amount and/or Complexity of Data Reviewed Labs: ordered. Radiology: ordered.  Risk Prescription drug management. Decision regarding hospitalization.          Final  Clinical Impression(s) / ED Diagnoses Final diagnoses:  AKI (acute kidney injury) ([place])  Cellulitis of buttock  Hyperglycemia  Sepsis with acute renal failure without septic shock, due to unspecified organism, unspecified acute renal failure type [facility])    Rx / DC Orders ED Discharge Orders     None         Kemper Durie, DO 10/01/21 2315

## 2021-10-01 NOTE — ED Triage Notes (Signed)
Pt c/o abscess to right hip for about 1 week, she is a diabetic. Reports chills and night sweats. Pt  is AxOx4.

## 2021-10-01 NOTE — H&P (Signed)
History and Physical    AZELIA CERF [id number] DOB: [birth year] DOA: 10/01/2021  PCP: Greig Right, MD  Patient coming from: Home  I have personally briefly reviewed patient's old medical records in [place]  Chief Complaint: Right buttocks infection  HPI: Mallory Butler is a 71 y.o. female with medical history significant for chronic combined systolic and diastolic CHF (EF [id number] by TTE 02/01/2021), CAD s/p PCI, insulin-dependent T2DM, HTN, HLD, hypothyroidism who presented to the ED for evaluation of right buttocks infection.  Patient reports feeling a nodule on her right buttocks 5 days ago.  Initially felt like a pea-sized but has increased in size somewhat since that time.  She has felt some swelling around the area with increasing pain as well.  She reports subjective fevers, chills, diaphoresis.  Due to worsening pain she came to the ED for further evaluation.  ED Course  Labs/Imaging on admission: I have personally reviewed following labs and imaging studies.  Initial vitals showed BP 101/56, pulse 76, RR 17, temp 97.9 F, SPO2 100% on room air.  Labs show WBC 17.9, hemoglobin 11.6, platelets 296,000, sodium 131, potassium 4.5, bicarb 21, BUN 58, creatinine 2.02, serum glucose 465, anion gap 19, LFTs within normal limits, lactic acid 3.9.  Blood cultures in process.  2 view chest x-ray negative for focal consolidation, edema, effusion.  CT pelvis without contrast showed cellulitis and developing phlegmon in the right buttock subcutaneous fat.  Edema within the right gluteus maximus concerning for infectious myositis seen.  No drainable fluid collection.  Patient was given 1 L LR, IV vancomycin and cefepime.  EDP spoke with on-call general surgery who felt no surgical intervention needed due to no drainable collections.  Recommended to continue antibiotics and can repeat imaging if needed to ensure she has not developed a collection.  The hospitalist service was  consulted to admit for further evaluation and management.  Review of Systems: All systems reviewed and are negative except as documented in history of present illness above.   Past Medical History:  Diagnosis Date   CHF (congestive heart failure) ([place])    Coronary artery disease    COVID-19    Diabetes mellitus without complication ([place])    Hyperlipidemia    NSTEMI (non-ST elevated myocardial infarction) ([place])    Thyroid disease     Past Surgical History:  Procedure Laterality Date   CORONARY STENT INTERVENTION N/A 02/02/2021   Procedure: CORONARY STENT INTERVENTION;  Surgeon: Adrian Prows, MD;  Location: [facility];  Service: Cardiovascular;  Laterality: N/A;   KNEE SURGERY Left    RIGHT/LEFT HEART CATH AND CORONARY ANGIOGRAPHY N/A 02/02/2021   Procedure: RIGHT/LEFT HEART CATH AND CORONARY ANGIOGRAPHY;  Surgeon: Adrian Prows, MD;  Location: [facility];  Service: Cardiovascular;  Laterality: N/A;    Social History:  reports that she quit smoking about 51 years ago. Her smoking use included cigarettes. She has never used smokeless tobacco. She reports that she does not currently use alcohol. No history on file for drug use.  No Known Allergies  Family History  Problem Relation Age of Onset   Hyperlipidemia Mother    Cancer Mother    Heart attack Father    Hypertension Father    Heart attack Sister      Prior to Admission medications   Medication Sig Start Date End Date Taking? Authorizing Provider  aspirin EC 81 MG tablet Take 1 tablet by mouth daily. 07/14/15   [provider]  atorvastatin (LIPITOR) 40 MG tablet TAKE 1 TABLET BY MOUTH EVERYDAY AT BEDTIME 07/20/21   Tolia, Sunit, DO  bisoprolol (ZEBETA) 10 MG tablet Take 10 mg by mouth daily.    [provider]  BRILINTA 90 MG TABS tablet TAKE 1 TABLET BY MOUTH 2 TIMES DAILY. 08/17/21   Tolia, Sunit, DO  dapagliflozin propanediol (FARXIGA) 10 MG TABS tablet Take 1 tablet (10 mg total) by mouth  daily. 02/07/21   Domenic Polite, MD  ezetimibe (ZETIA) 10 MG tablet TAKE 1 TABLET BY MOUTH EVERY DAY 07/20/21   Tolia, Sunit, DO  glipiZIDE (GLUCOTROL) 10 MG tablet Take 10 mg by mouth 2 (two) times daily before a meal. 05/30/18   [provider]  glucose blood (ONETOUCH ULTRA) test strip USE TO TEST 3 TIMES DAILY 06/27/17   [provider]  HUMALOG MIX 75/25 KWIKPEN (75-25) 100 UNIT/ML KwikPen Inject into the skin. 03/17/21   [provider]  levothyroxine (SYNTHROID) 88 MCG tablet Take 88 mcg by mouth daily before breakfast. 07/05/18   [provider]  metFORMIN (GLUCOPHAGE) 1000 MG tablet Take 1 tablet by mouth 2 (two) times a day. 04/28/18   [provider]  Multiple Vitamin (MULTIVITAMIN) capsule Take 1 capsule by mouth daily.    [provider]  ofloxacin (OCUFLOX) 0.3 % ophthalmic solution Place 1 drop into the right eye daily. 05/29/21   [provider]  oxybutynin (DITROPAN XL) 15 MG 24 hr tablet Take 15 mg by mouth daily. 03/01/21   [provider]  prednisoLONE acetate (PRED FORTE) 1 % ophthalmic suspension Place 1 drop into the right eye daily. 06/09/21   [provider]  sacubitril-valsartan (ENTRESTO) 97-103 MG Take 1 tablet by mouth 2 (two) times daily. 07/17/21   Tolia, Sunit, DO  spironolactone (ALDACTONE) 25 MG tablet TAKE 1/2 TABLET BY MOUTH EVERY DAY 08/24/21   Terri Skains, Sunit, DO  venlafaxine XR (EFFEXOR-XR) 150 MG 24 hr capsule Take 150 mg by mouth daily with breakfast. 07/03/18   [provider]    Physical Exam: Vitals:   10/01/21 2042 10/01/21 2216 10/01/21 2315 10/02/21 0000  BP: (!) 99/59 (!) 99/58    Pulse: 83 82 85   Resp:  18 18   Temp:    98.1 F (36.7 C)  TempSrc:      SpO2:  98% 95%    Constitutional: Obese woman resting in bed, NAD, calm, comfortable Eyes: EOMI, lids and conjunctivae normal ENMT: Mucous membranes are moist. Posterior pharynx clear of any exudate or lesions.Normal  dentition.  Neck: normal, supple, no masses. Respiratory: clear to auscultation bilaterally, no wheezing, no crackles. Normal respiratory effort. No accessory muscle use.  Cardiovascular: Regular rate and rhythm, no murmurs / rubs / gallops. No extremity edema. 2+ pedal pulses. Abdomen: no tenderness, no masses palpated. Musculoskeletal: no clubbing / cyanosis. No joint deformity upper and lower extremities. Good ROM, no contractures. Normal muscle tone.  Skin: Approximately 2 cm circular ulcer of the right buttocks with overlying eschar and surrounding erythema, warmth, induration, tenderness to palpation.  No active drainage. Neurologic: Sensation intact. Strength 5/5 in all 4.  Psychiatric:  Alert and oriented x 3. Normal mood.   EKG: Not performed. Assessment/Plan Principal Problem:   Severe sepsis due to cellulitis of right buttock (HCC) Active Problems:   Cellulitis of right buttock   Acute renal failure superimposed on stage 3b chronic kidney disease (HCC)   Chronic combined systolic and diastolic CHF (congestive heart failure) ([place])  Insulin dependent type 2 diabetes mellitus ([place])   Hypertension associated with diabetes ([place])   Hyperlipidemia associated with type 2 diabetes mellitus ([place])   Hypothyroidism   Atherosclerosis of native coronary artery of native heart without angina pectoris   FLORE RAMIL is a 71 y.o. female with medical history significant for chronic combined systolic and diastolic CHF (EF [id number] by TTE 02/01/2021), CAD s/p PCI, insulin-dependent T2DM, HTN, HLD, hypothyroidism who is admitted with sepsis due to right buttock cellulitis.  Assessment and Plan: * Severe sepsis due to cellulitis of right buttock ([place]) Presenting with leukocytosis, lactic acidosis, AKI.  Has cellulitis of right buttock with concern for infectious myositis on CT imaging.  No fluid collection seen. -Continue empiric IV vancomycin, cefepime, Flagyl -Follow blood cultures  Acute renal  failure superimposed on stage 3b chronic kidney disease ([place]) In setting of sepsis.  Started on gentle IV fluid hydration overnight.  Monitor UOP and repeat labs in AM.  Chronic combined systolic and diastolic CHF (congestive heart failure) (HCC) Appears euvolemic on admission.  Holding Entresto, spironolactone, Farxiga in setting of AKI. -Monitor strict I/O's with ongoing gentle IV fluid hydration  Insulin dependent type 2 diabetes mellitus ([place]) Hyperglycemic on admission.  Start on Semglee 30 units twice daily and resistant SSI with HS coverage.  Hypertension associated with diabetes ([place]) Holding antihypertensives as BP has been on the low side.  Maintaining MAP >65.  Atherosclerosis of native coronary artery of native heart without angina pectoris Denies any chest pain.  Continue aspirin, Brilinta, statin, Zetia.  Hypothyroidism Continue Synthroid.  Hyperlipidemia associated with type 2 diabetes mellitus (HCC) Continue atorvastatin and Zetia.  DVT prophylaxis: heparin injection 5,000 Units Start: 10/02/21 0600 Code Status: Full code, confirmed with patient on admission Family Communication: Patient, she has discussed with family Disposition Plan: From home, dispo pending clinical progress Consults called: None Severity of Illness: The appropriate patient status for this patient is INPATIENT. Inpatient status is judged to be reasonable and necessary in order to provide the required intensity of service to ensure the patient's safety. The patient's presenting symptoms, physical exam findings, and initial radiographic and laboratory data in the context of their chronic comorbidities is felt to place them at high risk for further clinical deterioration. Furthermore, it is not anticipated that the patient will be medically stable for discharge from the hospital within 2 midnights of admission.   * I certify that at the point of admission it is my clinical judgment that the patient will  require inpatient hospital care spanning beyond 2 midnights from the point of admission due to high intensity of service, high risk for further deterioration and high frequency of surveillance required.Zada Finders MD [facility]  If 7PM-7AM, please contact night-coverage [email or web address]  10/02/2021, 2:03 AM

## 2021-10-01 NOTE — ED Notes (Signed)
Family friend Lovena Le [phone] Significant other Elenore Rota  [phone]   If son doesn't answers for updates or questions

## 2021-10-01 NOTE — ED Provider Triage Note (Signed)
Emergency Medicine Provider Triage Evaluation Note  Mallory Butler , a 71 y.o. female  was evaluated in triage.  Pt complains of right buttocks mass.  States that over the past couple of days she has noticed a pea-sized lump on her right buttocks that has slowly been enlarging.  She feels that she has a underlying abscess.  She also says she had associated fevers.  She states that she has not actually checked these at home but has felt very feverish.  She has a softer blood pressure here which is unusual for her.  Review of Systems  Positive:  Negative:   Physical Exam  BP (!) 101/56   Pulse 76   Temp 97.9 F (36.6 C) (Oral)   Resp 17   SpO2 100%  Gen:   Awake, no distress   Resp:  Normal effort  MSK:   Moves extremities without difficulty  Other:  Unable to palpate abscess with clothes on. The area in question does not feel fluctuant. Seems indurated and deep.   Medical Decision Making  Medically screening exam initiated at 6:32 PM.  Appropriate orders placed.  Mallory Butler was informed that the remainder of the evaluation will be completed by another provider, this initial triage assessment does not replace that evaluation, and the importance of remaining in the ED until their evaluation is complete.  Will evaluate with CT scan of pelvis for possible deep abscess   Mallory Birchwood, PA-C 10/01/21 1834

## 2021-10-02 DIAGNOSIS — R6520 Severe sepsis without septic shock: Secondary | ICD-10-CM | POA: Diagnosis not present

## 2021-10-02 DIAGNOSIS — A419 Sepsis, unspecified organism: Secondary | ICD-10-CM | POA: Diagnosis not present

## 2021-10-02 DIAGNOSIS — N179 Acute kidney failure, unspecified: Secondary | ICD-10-CM | POA: Diagnosis not present

## 2021-10-02 DIAGNOSIS — L03317 Cellulitis of buttock: Secondary | ICD-10-CM | POA: Diagnosis not present

## 2021-10-02 DIAGNOSIS — N1832 Chronic kidney disease, stage 3b: Secondary | ICD-10-CM

## 2021-10-02 LAB — CBG MONITORING, ED
Glucose-Capillary: 475 mg/dL — ABNORMAL HIGH (ref 70–99)
Glucose-Capillary: 318 mg/dL — ABNORMAL HIGH (ref 70–99)
Glucose-Capillary: 403 mg/dL — ABNORMAL HIGH (ref 70–99)
Glucose-Capillary: 282 mg/dL — ABNORMAL HIGH (ref 70–99)
Glucose-Capillary: 247 mg/dL — ABNORMAL HIGH (ref 70–99)

## 2021-10-02 LAB — BASIC METABOLIC PANEL
Anion gap: 15 (ref 5–15)
Anion gap: 15 (ref 5–15)
BUN: 62 mg/dL — ABNORMAL HIGH (ref 8–23)
BUN: 69 mg/dL — ABNORMAL HIGH (ref 8–23)
CO2: 21 mmol/L — ABNORMAL LOW (ref 22–32)
CO2: 20 mmol/L — ABNORMAL LOW (ref 22–32)
Calcium: 8.1 mg/dL — ABNORMAL LOW (ref 8.9–10.3)
Calcium: 8.3 mg/dL — ABNORMAL LOW (ref 8.9–10.3)
Chloride: 96 mmol/L — ABNORMAL LOW (ref 98–111)
Chloride: 95 mmol/L — ABNORMAL LOW (ref 98–111)
Creatinine, Ser: 1.95 mg/dL — ABNORMAL HIGH (ref 0.44–1.00)
Creatinine, Ser: 2.23 mg/dL — ABNORMAL HIGH (ref 0.44–1.00)
GFR, Estimated: 27 mL/min — ABNORMAL LOW (ref >60)
GFR, Estimated: 23 mL/min — ABNORMAL LOW (ref >60)
Glucose, Bld: 390 mg/dL — ABNORMAL HIGH (ref 70–99)
Glucose, Bld: 295 mg/dL — ABNORMAL HIGH (ref 70–99)
Potassium: 4.2 mmol/L (ref 3.5–5.1)
Potassium: 4.1 mmol/L (ref 3.5–5.1)
Sodium: 132 mmol/L — ABNORMAL LOW (ref 135–145)
Sodium: 130 mmol/L — ABNORMAL LOW (ref 135–145)

## 2021-10-02 LAB — CBC
HCT: 37.2 % (ref 36.0–46.0)
Hemoglobin: 11.7 g/dL — ABNORMAL LOW (ref 12.0–15.0)
MCH: 32.9 pg (ref 26.0–34.0)
MCHC: 31.5 g/dL (ref 30.0–36.0)
MCV: 104.5 fL — ABNORMAL HIGH (ref 80.0–100.0)
Platelets: 238 10*3/uL (ref 150–400)
RBC: 3.56 MIL/uL — ABNORMAL LOW (ref 3.87–5.11)
RDW: 12.7 % (ref 11.5–15.5)
WBC: 17.4 10*3/uL — ABNORMAL HIGH (ref 4.0–10.5)
nRBC: 0 % (ref 0.0–0.2)

## 2021-10-02 LAB — LACTIC ACID, PLASMA: Lactic Acid, Venous: 2.2 mmol/L (ref 0.5–1.9)

## 2021-10-02 LAB — PROCALCITONIN
Procalcitonin: 12.27 ng/mL
Procalcitonin: 12.82 ng/mL

## 2021-10-02 LAB — GLUCOSE, CAPILLARY: Glucose-Capillary: 354 mg/dL — ABNORMAL HIGH (ref 70–99)

## 2021-10-02 LAB — BETA-HYDROXYBUTYRIC ACID
Beta-Hydroxybutyric Acid: 2.33 mmol/L — ABNORMAL HIGH (ref 0.05–0.27)
Beta-Hydroxybutyric Acid: 0.26 mmol/L (ref 0.05–0.27)

## 2021-10-02 LAB — HEMOGLOBIN A1C
Hgb A1c MFr Bld: 10.4 % — ABNORMAL HIGH (ref 4.8–5.6)
Mean Plasma Glucose: 251.78 mg/dL

## 2021-10-02 MED ORDER — EZETIMIBE 10 MG PO TABS
10.0000 mg | ORAL_TABLET | Freq: Every day | ORAL | Status: DC
  Administered 2021-10-02 – 2021-10-10 (×9): 10 mg via ORAL
  Filled 2021-10-02 (×9): qty 1

## 2021-10-02 MED ORDER — SODIUM CHLORIDE 0.9 % IV SOLN
2.0000 g | INTRAVENOUS | Status: DC
  Administered 2021-10-02 – 2021-10-03 (×2): 2 g via INTRAVENOUS
  Filled 2021-10-02 (×2): qty 12.5

## 2021-10-02 MED ORDER — TICAGRELOR 90 MG PO TABS
90.0000 mg | ORAL_TABLET | Freq: Two times a day (BID) | ORAL | Status: DC
  Administered 2021-10-02 – 2021-10-04 (×5): 90 mg via ORAL
  Filled 2021-10-02 (×6): qty 1

## 2021-10-02 MED ORDER — INSULIN ASPART 100 UNIT/ML IJ SOLN
0.0000 [IU] | Freq: Every day | INTRAMUSCULAR | Status: DC
  Administered 2021-10-02: 5 [IU] via SUBCUTANEOUS

## 2021-10-02 MED ORDER — ASPIRIN 81 MG PO TBEC
81.0000 mg | DELAYED_RELEASE_TABLET | Freq: Every day | ORAL | Status: DC
  Administered 2021-10-02 – 2021-10-04 (×3): 81 mg via ORAL
  Filled 2021-10-02 (×3): qty 1

## 2021-10-02 MED ORDER — HEPARIN SODIUM (PORCINE) 5000 UNIT/ML IJ SOLN
5000.0000 [IU] | Freq: Three times a day (TID) | INTRAMUSCULAR | Status: DC
  Administered 2021-10-02 – 2021-10-04 (×7): 5000 [IU] via SUBCUTANEOUS
  Filled 2021-10-02 (×7): qty 1

## 2021-10-02 MED ORDER — INSULIN ASPART 100 UNIT/ML IJ SOLN
10.0000 [IU] | Freq: Once | INTRAMUSCULAR | Status: AC
  Administered 2021-10-02: 10 [IU] via SUBCUTANEOUS

## 2021-10-02 MED ORDER — INSULIN GLARGINE-YFGN 100 UNIT/ML ~~LOC~~ SOLN
40.0000 [IU] | Freq: Two times a day (BID) | SUBCUTANEOUS | Status: DC
  Administered 2021-10-02: 40 [IU] via SUBCUTANEOUS
  Filled 2021-10-02 (×3): qty 0.4

## 2021-10-02 MED ORDER — METRONIDAZOLE 500 MG/100ML IV SOLN
500.0000 mg | Freq: Two times a day (BID) | INTRAVENOUS | Status: DC
  Administered 2021-10-02 – 2021-10-03 (×4): 500 mg via INTRAVENOUS
  Filled 2021-10-02 (×5): qty 100

## 2021-10-02 MED ORDER — OXYCODONE HCL 5 MG PO TABS
5.0000 mg | ORAL_TABLET | Freq: Four times a day (QID) | ORAL | Status: DC
  Administered 2021-10-02 – 2021-10-03 (×4): 5 mg via ORAL
  Filled 2021-10-02 (×5): qty 1

## 2021-10-02 MED ORDER — SODIUM CHLORIDE 0.9 % IV SOLN: INTRAVENOUS | Status: AC

## 2021-10-02 MED ORDER — ONDANSETRON HCL 4 MG PO TABS: 4.0000 mg | ORAL_TABLET | Freq: Four times a day (QID) | ORAL | Status: DC | PRN

## 2021-10-02 MED ORDER — OXYCODONE HCL 5 MG PO TABS: 5.0000 mg | ORAL_TABLET | ORAL | Status: DC | PRN

## 2021-10-02 MED ORDER — VENLAFAXINE HCL ER 75 MG PO CP24
150.0000 mg | ORAL_CAPSULE | Freq: Every day | ORAL | Status: DC
  Administered 2021-10-02 – 2021-10-10 (×9): 150 mg via ORAL
  Filled 2021-10-02: qty 1
  Filled 2021-10-02 (×4): qty 2
  Filled 2021-10-02 (×2): qty 1
  Filled 2021-10-02: qty 2
  Filled 2021-10-02: qty 1

## 2021-10-02 MED ORDER — ACETAMINOPHEN 650 MG RE SUPP: 650.0000 mg | Freq: Four times a day (QID) | RECTAL | Status: DC | PRN

## 2021-10-02 MED ORDER — VANCOMYCIN HCL 1250 MG/250ML IV SOLN
1250.0000 mg | INTRAVENOUS | Status: DC
  Administered 2021-10-03: 1250 mg via INTRAVENOUS
  Filled 2021-10-02 (×2): qty 250

## 2021-10-02 MED ORDER — SODIUM CHLORIDE 0.9% FLUSH
3.0000 mL | Freq: Two times a day (BID) | INTRAVENOUS | Status: DC
  Administered 2021-10-02 – 2021-10-10 (×15): 3 mL via INTRAVENOUS

## 2021-10-02 MED ORDER — LEVOTHYROXINE SODIUM 88 MCG PO TABS
88.0000 ug | ORAL_TABLET | Freq: Every day | ORAL | Status: DC
  Administered 2021-10-02 – 2021-10-10 (×9): 88 ug via ORAL
  Filled 2021-10-02 (×8): qty 1

## 2021-10-02 MED ORDER — ATORVASTATIN CALCIUM 40 MG PO TABS
40.0000 mg | ORAL_TABLET | Freq: Every day | ORAL | Status: DC
  Administered 2021-10-02 – 2021-10-10 (×9): 40 mg via ORAL
  Filled 2021-10-02 (×9): qty 1

## 2021-10-02 MED ORDER — HYDROMORPHONE HCL 1 MG/ML IJ SOLN
1.0000 mg | INTRAMUSCULAR | Status: AC | PRN
  Administered 2021-10-02 – 2021-10-03 (×3): 1 mg via INTRAVENOUS
  Filled 2021-10-02 (×3): qty 1

## 2021-10-02 MED ORDER — INSULIN GLARGINE-YFGN 100 UNIT/ML ~~LOC~~ SOLN
36.0000 [IU] | Freq: Two times a day (BID) | SUBCUTANEOUS | Status: DC
  Administered 2021-10-02: 36 [IU] via SUBCUTANEOUS
  Filled 2021-10-02 (×3): qty 0.36

## 2021-10-02 MED ORDER — INSULIN ASPART 100 UNIT/ML IJ SOLN
0.0000 [IU] | Freq: Three times a day (TID) | INTRAMUSCULAR | Status: DC
  Administered 2021-10-02: 4 [IU] via SUBCUTANEOUS
  Administered 2021-10-02: 11 [IU] via SUBCUTANEOUS
  Administered 2021-10-03: 20 [IU] via SUBCUTANEOUS

## 2021-10-02 MED ORDER — ACETAMINOPHEN 325 MG PO TABS
650.0000 mg | ORAL_TABLET | Freq: Four times a day (QID) | ORAL | Status: DC | PRN
  Administered 2021-10-03: 650 mg via ORAL
  Filled 2021-10-02: qty 2

## 2021-10-02 MED ORDER — ONDANSETRON HCL 4 MG/2ML IJ SOLN: 4.0000 mg | Freq: Four times a day (QID) | INTRAMUSCULAR | Status: DC | PRN

## 2021-10-02 MED ORDER — INSULIN GLARGINE-YFGN 100 UNIT/ML ~~LOC~~ SOLN
30.0000 [IU] | Freq: Two times a day (BID) | SUBCUTANEOUS | Status: DC
  Administered 2021-10-02: 30 [IU] via SUBCUTANEOUS
  Filled 2021-10-02 (×4): qty 0.3

## 2021-10-02 NOTE — Assessment & Plan Note (Signed)
Continue atorvastatin and Zetia. 

## 2021-10-02 NOTE — Assessment & Plan Note (Signed)
Continue Synthroid °

## 2021-10-02 NOTE — ED Notes (Signed)
Rizwan, MD notified of CBG 406, will continue to monitor.

## 2021-10-02 NOTE — Assessment & Plan Note (Addendum)
In setting of sepsis.  Started on gentle IV fluid hydration overnight.  Monitor UOP and repeat labs in AM.

## 2021-10-02 NOTE — Assessment & Plan Note (Signed)
Appears euvolemic on admission.  Holding Entresto, spironolactone, Farxiga in setting of AKI. -Monitor strict I/O's with ongoing gentle IV fluid hydration

## 2021-10-02 NOTE — ED Notes (Signed)
Patient becoming frustrated with staff. Patient wants to sit up on side of bed. RN discouraged patient from doing this due to patient not being able to stand and not having great trunk control. Patient upset with this RN and states that she is "causing a decline in her care". This RN reached out to provider for orders for PT/OT.

## 2021-10-02 NOTE — Assessment & Plan Note (Signed)
Holding antihypertensives as BP has been on the low side.  Maintaining MAP >65.

## 2021-10-02 NOTE — Assessment & Plan Note (Signed)
Hyperglycemic on admission.  Start on Semglee 30 units twice daily and resistant SSI with HS coverage.

## 2021-10-02 NOTE — Progress Notes (Signed)
[facility] Progress Note  Patient: Mallory Butler     [id number]  DOA: 10/01/2021   PCP: Greig Right, MD       Brief hospital course: 71 y/o female with CAD s/p PCI, IDDM, HTn, HLD, chronic combined systolic and diastolic CHF (EF [id number]) who presented to the ED for a right buttock infection that had grown from a small nodule noted 5 days prior. She also admits to diarrhea about 4-5 episodes a day for about 3 days. No nausea or vomiting.   WBC 17.9, Procalcitonin 12.27 Na 131, Bun 58, Cr 2.02 (baseline 0.79) CO2 21 Glucose 465, BHBA 2.33 A1c 10.4  CT pelvis w/o contrast> cellulitis and developing phlegmon in the right buttock subcutaneous fat, edema within the right gluteus maximus concerning for infectious myositis.  Subjective:  Burning pain in right buttock which is severe. 1 episode of diarrhea since coming to the hospital.   Assessment and Plan: Principal Problem:   Severe sepsis due to cellulitis of right buttock (HCC) - erythema present on right buttock and a portion of the left buttock- scab in the center of the left buttock with drainage noted on sheets - cont Vancomycin, Flagyl and Cefepime - f/u blood cultures - cont pain control with oral and IV narcotics  Active Problems:     Acute renal failure superimposed on stage 3b chronic kidney disease (HCC) Chronic combined systolic and diastolic CHF (congestive heart failure) (HCC) Hyponatremia  - due to diarrhea and diuretics - Holding Entresto and Spironolactone  - she received 1 L of LR in the ED and then continuous fluids at 75 cc/hr - hold off on further IVF  Diarrhea - does not seem infectious- abdominal exam benign  Metabolic acidosis with anion gap - due to AKI and diarrhea - follow     DKA with Insulin dependent type 2 diabetes mellitus ([place]) Severely uncontrolled CBGs - we spoke extensively about controlling her sugars - she takes 75/25 35 U in AM - Based on her evening glucose  checks, sometimes skips the evening dose because her sugars sometimes tends to drop to 60s in the middle of the night - also takes Glipizide and Metformin - A1c 10.4 - currently on Semglee- I have increased the dose this AM - cont SSI - she is interested in trying long acting insulin and mealtime Novolog   Hypotension with h/o Hypertension  - SBP ~ 90-100 when admitted - Bisoprolol on hold   CAD/ Hyperlipidemia  - stenting of prox LAD in 02/06/21 when she had an NSTEMI - Zetia, Brillinta, ASA (DTAP x 1 yr)    Hypothyroidism Synthroid      Code Status: Full Code DVT prophylaxis:  heparin injection 5,000 Units Start: 10/02/21 0600   Consultants: none Level of Care: Level of care: Telemetry Cardiac  Objective:   Vitals:   10/02/21 0000 10/02/21 0315 10/02/21 0400 10/02/21 0415  BP:  (!) 117/91 (!) 104/50 95/67  Pulse:  86 81 75  Resp:    20  Temp: 98.1 F (36.7 C)   98.1 F (36.7 C)  TempSrc:      SpO2:  95% 93% 97%   There were no vitals filed for this visit. Exam: General exam: Appears uncomfortable  HEENT: oral mucosa moist Respiratory system: Clear to auscultation.  Cardiovascular system: S1 & S2 heard, tachycardic  Gastrointestinal system: Abdomen soft, non-tender, nondistended. Normal bowel sounds   Extremities: No cyanosis, clubbing or edema Psychiatry:  flat affect    Imaging  and lab data was personally reviewed    CBC: Recent Labs  Lab 10/01/21 1945 10/02/21 0420  WBC 17.9* 17.4*  NEUTROABS 14.3*  --   HGB 11.6* 11.7*  HCT 35.1* 37.2  MCV 98.0 104.5*  PLT 296 301   Basic Metabolic Panel: Recent Labs  Lab 10/01/21 1945 10/02/21 0540  NA 131* 132*  K 4.5 4.2  CL 91* 96*  CO2 21* 21*  GLUCOSE 465* 390*  BUN 58* 62*  CREATININE 2.02* 1.95*  CALCIUM 8.8* 8.1*   GFR: CrCl cannot be calculated (Unknown ideal weight.).  Scheduled Meds:  aspirin EC  81 mg Oral Daily   atorvastatin  40 mg Oral Daily   ezetimibe  10 mg Oral Daily    heparin  5,000 Units Subcutaneous Q8H   insulin aspart  0-20 Units Subcutaneous TID WC   insulin aspart  0-5 Units Subcutaneous QHS   insulin glargine-yfgn  30 Units Subcutaneous BID   levothyroxine  88 mcg Oral Q0600   sodium chloride flush  3 mL Intravenous Q12H   ticagrelor  90 mg Oral BID   venlafaxine XR  150 mg Oral Q breakfast   Continuous Infusions:  sodium chloride 75 mL/hr at 10/02/21 0224   ceFEPime (MAXIPIME) IV     metronidazole 500 mg (10/02/21 0239)   [START ON 10/03/2021] vancomycin       LOS: 1 day   Author: Debbe Odea  10/02/2021 8:20 AM

## 2021-10-02 NOTE — Assessment & Plan Note (Signed)
Presenting with leukocytosis, lactic acidosis, AKI.  Has cellulitis of right buttock with concern for infectious myositis on CT imaging.  No fluid collection seen. -Continue empiric IV vancomycin, cefepime, Flagyl -Follow blood cultures

## 2021-10-02 NOTE — Inpatient Diabetes Management (Signed)
Inpatient Diabetes Program Recommendations  AACE/ADA: New Consensus Statement on Inpatient Glycemic Control   Target Ranges:  Prepandial:   less than 140 mg/dL      Peak postprandial:   less than 180 mg/dL (1-2 hours)      Critically ill patients:  140 - 180 mg/dL    Latest Reference Range & Units 10/02/21 02:19 10/02/21 08:35 10/02/21 10:21 10/02/21 11:46 10/02/21 13:16  Glucose-Capillary 70 - 99 mg/dL 448 (H)  Novolog 10 units  Semglee 30 units 318 (H)   Novolog 4 units   403 (H)     Semglee 36 units 282 (H)  Novolog 11 units    Latest Reference Range & Units 02/01/21 19:59 10/02/21 04:20  Hemoglobin A1C 4.8 - 5.6 % 12.0 (H) 10.4 (H)   Review of Glycemic Control  Diabetes history: DM2 Outpatient Diabetes medications: Humalog 75/25 45 units QAM, 75/25 35 units QPM, Metformin 1000 BID, Glipizide 10 mg BID, Farxiga 10 mg daily Current orders for Inpatient glycemic control: Semglee 36 units BID, Novolog 0-20 units TID with meals, Novolog 0-5 units QHS  Inpatient Diabetes Program Recommendations:    Insulin: If post prandial glucose remains consistently over 180 mg/dl, may want to consider ordering Novolog 3 units TID with meals for meal coverage if patient eats at least 50% of meals.  HbgA1C: A1C 10.4% on 10/02/21 indicating an average glucose of 252 mg/dl over the past 2-3 months. Patient reports that she is having hypoglycemia 2-3 times per week at night and getting up to treat. May need to have outpatient insulin dosages adjusted.  NOTE: Spoke with patient at bedside about diabetes and home regimen for diabetes control. Patient reports being followed by PCP for diabetes management and currently taking Humalog 75/25 45 units QAM, 75/25 35 units QPM, Metformin 1000 BID, Glipizide 10 mg BID, and Farxiga 10 mg daily as an outpatient for diabetes control. Patient admits that she has not taken any insulin in 3 days due to fear of having hypoglycemia because she "felt so sick".   Patient states she was still taking oral DM medications over past 3 days. Patient states that CBGs have been in 400's over the past 3 days but they are usually in the 100's mg/dl when she takes her insulin and oral DM meds. Patient reports that she has hypoglycemia 2-3 times per week during the night and she has to get up and get something to treat the low. Patient reports that she has informed her PCP of frequent hypoglycemia during the night but no changes were made with DM medications at her last office visit a couple of weeks ago. Discussed A1C results (10.4% on 10/02/21) and explained that current A1C indicates an average glucose of 252 mg/dl over the past 2-3 months. Discussed glucose and A1C goals. Discussed importance of checking CBGs and maintaining good CBG control to prevent long-term and short-term complications.  Stressed to the patient the importance of improving glycemic control to prevent further complications from uncontrolled diabetes. Discussed impact of nutrition, exercise, stress, sickness, and medications on diabetes control.  Explained to patient that she may need to have insulin dosages adjusted to prevent hypoglycemia during the night. Explained that she needs to talk with her PCP about possible decreasing the dose of insulin during sickness to keep glucose from getting so high (in the 400's).  Patient verbalized understanding of information discussed and reports no further questions at this time related to diabetes.  Thanks, Orlando Penner, RN, MSN, CDCES Diabetes Coordinator Inpatient  Diabetes Program [phone] (Team Pager from 8am to 5pm)

## 2021-10-02 NOTE — Progress Notes (Signed)
Pharmacy Antibiotic Note  Mallory Butler is a 71 y.o. female admitted on 10/01/2021 with cellulitis.  Pharmacy has been consulted for Vancomycin and Cefepime  dosing.  Vancomycin 2 g IV given in ED at midnight  Plan: Vancomycin 1250 mg IV q48h Cefepime 2 g IV q24h    Temp (24hrs), Avg:98 F (36.7 C), Min:97.9 F (36.6 C), Max:98.1 F (36.7 C)  Recent Labs  Lab 10/01/21 1945 10/01/21 2211  WBC 17.9*  --   CREATININE 2.02*  --   LATICACIDVEN  --  3.9*    CrCl cannot be calculated (Unknown ideal weight.).    No Known Allergies   Mallory Butler 10/02/2021 2:13 AM

## 2021-10-02 NOTE — Plan of Care (Signed)
  Problem: Fluid Volume: Goal: Hemodynamic stability will improve Outcome: Progressing   

## 2021-10-02 NOTE — ED Notes (Signed)
Patient moved in stable condition, with staff, and her belongings, D'Erika aware of patients elevated glucose and that I have sent the MD a message about her glucose.

## 2021-10-02 NOTE — Assessment & Plan Note (Signed)
Denies any chest pain.  Continue aspirin, Brilinta, statin, Zetia.

## 2021-10-02 NOTE — ED Notes (Signed)
Patient resting in bed with eyes closed, snoring, no s/s of distress, will continue to monitor.

## 2021-10-02 NOTE — Hospital Course (Signed)
Mallory Butler is a 71 y.o. female with medical history significant for chronic combined systolic and diastolic CHF (EF 62-70% by TTE 02/01/2021), CAD s/p PCI, insulin-dependent T2DM, HTN, HLD, hypothyroidism who is admitted with sepsis due to right buttock cellulitis.

## 2021-10-03 ENCOUNTER — Inpatient Hospital Stay (HOSPITAL_COMMUNITY): Payer: Medicare Other

## 2021-10-03 DIAGNOSIS — R6521 Severe sepsis with septic shock: Secondary | ICD-10-CM

## 2021-10-03 DIAGNOSIS — Z794 Long term (current) use of insulin: Secondary | ICD-10-CM

## 2021-10-03 DIAGNOSIS — E119 Type 2 diabetes mellitus without complications: Secondary | ICD-10-CM

## 2021-10-03 DIAGNOSIS — I5042 Chronic combined systolic (congestive) and diastolic (congestive) heart failure: Secondary | ICD-10-CM | POA: Diagnosis not present

## 2021-10-03 DIAGNOSIS — N1832 Chronic kidney disease, stage 3b: Secondary | ICD-10-CM | POA: Diagnosis not present

## 2021-10-03 DIAGNOSIS — N179 Acute kidney failure, unspecified: Secondary | ICD-10-CM | POA: Diagnosis not present

## 2021-10-03 DIAGNOSIS — A419 Sepsis, unspecified organism: Secondary | ICD-10-CM | POA: Diagnosis not present

## 2021-10-03 LAB — GLUCOSE, CAPILLARY
Glucose-Capillary: 367 mg/dL — ABNORMAL HIGH (ref 70–99)
Glucose-Capillary: 293 mg/dL — ABNORMAL HIGH (ref 70–99)
Glucose-Capillary: 266 mg/dL — ABNORMAL HIGH (ref 70–99)
Glucose-Capillary: 272 mg/dL — ABNORMAL HIGH (ref 70–99)
Glucose-Capillary: 270 mg/dL — ABNORMAL HIGH (ref 70–99)
Glucose-Capillary: 247 mg/dL — ABNORMAL HIGH (ref 70–99)
Glucose-Capillary: 234 mg/dL — ABNORMAL HIGH (ref 70–99)

## 2021-10-03 LAB — URINALYSIS, ROUTINE W REFLEX MICROSCOPIC
Bilirubin Urine: NEGATIVE
Glucose, UA: 50 mg/dL — AB
Hgb urine dipstick: NEGATIVE
Ketones, ur: 5 mg/dL — AB
Nitrite: NEGATIVE
Protein, ur: NEGATIVE mg/dL
Specific Gravity, Urine: 1.023 (ref 1.005–1.030)
pH: 5 (ref 5.0–8.0)

## 2021-10-03 LAB — BASIC METABOLIC PANEL
Anion gap: 15 (ref 5–15)
BUN: 83 mg/dL — ABNORMAL HIGH (ref 8–23)
CO2: 19 mmol/L — ABNORMAL LOW (ref 22–32)
Calcium: 7.9 mg/dL — ABNORMAL LOW (ref 8.9–10.3)
Chloride: 96 mmol/L — ABNORMAL LOW (ref 98–111)
Creatinine, Ser: 2.79 mg/dL — ABNORMAL HIGH (ref 0.44–1.00)
GFR, Estimated: 18 mL/min — ABNORMAL LOW (ref >60)
Glucose, Bld: 373 mg/dL — ABNORMAL HIGH (ref 70–99)
Potassium: 3.9 mmol/L (ref 3.5–5.1)
Sodium: 130 mmol/L — ABNORMAL LOW (ref 135–145)

## 2021-10-03 LAB — LACTIC ACID, PLASMA
Lactic Acid, Venous: 2.3 mmol/L (ref 0.5–1.9)
Lactic Acid, Venous: 2.6 mmol/L (ref 0.5–1.9)
Lactic Acid, Venous: 1.6 mmol/L (ref 0.5–1.9)

## 2021-10-03 LAB — CBC
HCT: 34.5 % — ABNORMAL LOW (ref 36.0–46.0)
Hemoglobin: 11.5 g/dL — ABNORMAL LOW (ref 12.0–15.0)
MCH: 31.9 pg (ref 26.0–34.0)
MCHC: 33.3 g/dL (ref 30.0–36.0)
MCV: 95.6 fL (ref 80.0–100.0)
Platelets: 330 10*3/uL (ref 150–400)
RBC: 3.61 MIL/uL — ABNORMAL LOW (ref 3.87–5.11)
RDW: 12.9 % (ref 11.5–15.5)
WBC: 12.4 10*3/uL — ABNORMAL HIGH (ref 4.0–10.5)
nRBC: 0 % (ref 0.0–0.2)

## 2021-10-03 LAB — MAGNESIUM: Magnesium: 2.1 mg/dL (ref 1.7–2.4)

## 2021-10-03 LAB — TROPONIN I (HIGH SENSITIVITY)
Troponin I (High Sensitivity): 55 ng/L — ABNORMAL HIGH (ref <18)
Troponin I (High Sensitivity): 86 ng/L — ABNORMAL HIGH (ref <18)
Troponin I (High Sensitivity): 130 ng/L (ref <18)

## 2021-10-03 LAB — ECHOCARDIOGRAM COMPLETE
AR max vel: 3 cm2
AV Peak grad: 8.3 mmHg
Ao pk vel: 1.44 m/s
Area-P 1/2: 6.79 cm2
S' Lateral: 2.6 cm
Weight: 3682.56 oz

## 2021-10-03 MED ORDER — AMIODARONE LOAD VIA INFUSION
150.0000 mg | Freq: Once | INTRAVENOUS | Status: AC
  Administered 2021-10-03: 150 mg via INTRAVENOUS
  Filled 2021-10-03: qty 83.34

## 2021-10-03 MED ORDER — AMIODARONE HCL IN DEXTROSE 360-4.14 MG/200ML-% IV SOLN
30.0000 mg/h | INTRAVENOUS | Status: DC
  Administered 2021-10-04 – 2021-10-05 (×4): 30 mg/h via INTRAVENOUS
  Filled 2021-10-03 (×3): qty 200

## 2021-10-03 MED ORDER — NOREPINEPHRINE 4 MG/250ML-% IV SOLN
2.0000 ug/min | INTRAVENOUS | Status: DC
  Filled 2021-10-03: qty 250

## 2021-10-03 MED ORDER — SODIUM CHLORIDE 0.9 % IV BOLUS
500.0000 mL | Freq: Once | INTRAVENOUS | Status: AC
  Administered 2021-10-03: 500 mL via INTRAVENOUS

## 2021-10-03 MED ORDER — LACTATED RINGERS IV BOLUS: 500.0000 mL | Freq: Once | INTRAVENOUS | Status: DC

## 2021-10-03 MED ORDER — FENTANYL CITRATE PF 50 MCG/ML IJ SOSY
25.0000 ug | PREFILLED_SYRINGE | INTRAMUSCULAR | Status: DC | PRN
  Administered 2021-10-04 (×4): 50 ug via INTRAVENOUS
  Filled 2021-10-03 (×4): qty 1

## 2021-10-03 MED ORDER — CHLORHEXIDINE GLUCONATE CLOTH 2 % EX PADS
6.0000 | MEDICATED_PAD | Freq: Every day | CUTANEOUS | Status: DC
  Administered 2021-10-03 – 2021-10-10 (×6): 6 via TOPICAL

## 2021-10-03 MED ORDER — AMIODARONE HCL IN DEXTROSE 360-4.14 MG/200ML-% IV SOLN
60.0000 mg/h | INTRAVENOUS | Status: AC
  Administered 2021-10-03 (×2): 60 mg/h via INTRAVENOUS
  Filled 2021-10-03 (×3): qty 200

## 2021-10-03 MED ORDER — METOPROLOL TARTRATE 5 MG/5ML IV SOLN
5.0000 mg | Freq: Once | INTRAVENOUS | Status: AC
  Administered 2021-10-03: 5 mg via INTRAVENOUS
  Filled 2021-10-03: qty 5

## 2021-10-03 MED ORDER — LACTATED RINGERS IV BOLUS
1000.0000 mL | Freq: Once | INTRAVENOUS | Status: AC
  Administered 2021-10-03: 1000 mL via INTRAVENOUS

## 2021-10-03 MED ORDER — PHENYLEPHRINE HCL-NACL 20-0.9 MG/250ML-% IV SOLN
25.0000 ug/min | INTRAVENOUS | Status: DC
  Administered 2021-10-03: 80 ug/min via INTRAVENOUS
  Administered 2021-10-03: 30 ug/min via INTRAVENOUS
  Administered 2021-10-03 – 2021-10-04 (×2): 80 ug/min via INTRAVENOUS
  Administered 2021-10-04: 40 ug/min via INTRAVENOUS
  Administered 2021-10-04: 80 ug/min via INTRAVENOUS
  Filled 2021-10-03 (×9): qty 250

## 2021-10-03 MED ORDER — SODIUM BICARBONATE 8.4 % IV SOLN
INTRAVENOUS | Status: DC
  Filled 2021-10-03 (×2): qty 1000

## 2021-10-03 MED ORDER — INSULIN GLARGINE-YFGN 100 UNIT/ML ~~LOC~~ SOLN
46.0000 [IU] | Freq: Two times a day (BID) | SUBCUTANEOUS | Status: DC
  Administered 2021-10-03 – 2021-10-07 (×10): 46 [IU] via SUBCUTANEOUS
  Filled 2021-10-03 (×15): qty 0.46

## 2021-10-03 MED ORDER — INSULIN ASPART 100 UNIT/ML IJ SOLN
0.0000 [IU] | INTRAMUSCULAR | Status: DC
  Administered 2021-10-03: 5 [IU] via SUBCUTANEOUS
  Administered 2021-10-03 – 2021-10-04 (×3): 8 [IU] via SUBCUTANEOUS
  Administered 2021-10-04 (×2): 5 [IU] via SUBCUTANEOUS
  Administered 2021-10-04: 3 [IU] via SUBCUTANEOUS
  Administered 2021-10-04 (×2): 5 [IU] via SUBCUTANEOUS
  Administered 2021-10-05: 2 [IU] via SUBCUTANEOUS
  Administered 2021-10-05: 5 [IU] via SUBCUTANEOUS
  Administered 2021-10-05: 8 [IU] via SUBCUTANEOUS
  Administered 2021-10-05: 3 [IU] via SUBCUTANEOUS
  Administered 2021-10-06: 2 [IU] via SUBCUTANEOUS

## 2021-10-03 MED ORDER — SODIUM CHLORIDE 0.9 % IV SOLN
250.0000 mL | INTRAVENOUS | Status: DC
  Administered 2021-10-03 – 2021-10-04 (×2): 250 mL via INTRAVENOUS

## 2021-10-03 NOTE — Significant Event (Signed)
Rapid Response Event Note   Reason for Call :  Hypotensive due to sepsis  Initial Focused Assessment:  Patient lying in bed in reverse trendelenburg. She states that she is sleepy and a bit dizzy. She denies any chest pain at the moment. On the monitor the patient is in new Afib. Patient's Bps were low, with SBPs in the 60's.  Suspected sepsis from her cellulitis from her buttocks. The patient was initiated on Levophed right before her transfer. NP preferred Neo vs Levo due to her Afib, but we did not have Neo available. She was initiated on 2 mcg of Levo, but remained hypotensive. Levo increased to 4 mcg before transferring patient. The patient was transferred to 2H-03.   BP 69/45 HR 119 RR 18 O2 98 Temp 98.1 rectal   Interventions:  Levo initiated Patient transferred to 2H-03 for higher level of care   Event Summary:   MD Notified: Dr. Ok Edwards NP, and Dr. Lamonte Sakai bedside Call Time: 3343 Arrival Time: [place] Time: [place], RN

## 2021-10-03 NOTE — Progress Notes (Signed)
Patient is being transferred to a progressive unit for close monitoring, her next of kin was notified, reported to nurse Sarah at [facility].

## 2021-10-03 NOTE — Progress Notes (Signed)
PT Cancellation Note  Patient Details Name: Mallory Butler MRN: [id number] DOB: 1950-08-29   Cancelled Treatment:    Reason Eval/Treat Not Completed: (P) Medical issues which prohibited therapy Rapid Response and transfer to [facility]. PT will follow back tomorrow for Evaluation.  Albi Rappaport B. Migdalia Dk PT, DPT Acute Rehabilitation Services Please use secure chat or  Call Office [phone]    [place] 10/03/2021, 12:31 PM

## 2021-10-03 NOTE — Consult Note (Signed)
[facility] Admit Date: 10/01/2021 10/03/2021 [place] Agent Requesting Physician:  Wynelle Cleveland MD  Reason for Consult:  AoCKD3, Shock HPI:  48F presented 9/28 with progressive large infection of the right buttock, now with septic shock, A-fib with RVR, AoCKD3a  PMH Incudes: CAD hx/o PCI DM2 HTN HLD Chronic s/d CHF HLD  At the time of presentation imaging by CT confirmed cellulitis with concern for infectious myositis with no overt abscess.  Received broad-spectrum antibiotics including vancomycin but in the past 24 hours with progressive complications including atrial fibrillation with RVR, hypotension.  Now into heart with CCM following on phenylephrine, amiodarone drip.  Patient appears to have mild CKD3a with creatinine between 1.0 and 1.5.  Trending creatinine as below.  No contrast exposure.  No NSAIDs.  UA today with no hematuria, pyuria, proteinuria.  No renal imaging to date.  Home medicines of Entresto and spironolactone have been held.  Foley catheter placed today thus far 0.55 L UOP.  Has received 0.5L IVF bolus so far today.    Patient awake, alert, oriented and appropriate at time of my evaluation.  No complaints.   Creatinine, Ser (mg/dL)  Date Value  10/03/2021 2.79 (H)  10/02/2021 2.23 (H)  10/02/2021 1.95 (H)  10/01/2021 2.02 (H)  07/16/2021 0.79  03/05/2021 1.03 (H)  02/06/2021 1.41 (H)  02/05/2021 1.50 (H)  02/04/2021 1.63 (H)  02/03/2021 1.18 (H)  ] I/Os: I/O last 3 completed shifts: In: 879.4 [I.V.:116.5; IV Piggyback:762.9] Out: -    ROS NSAIDS: no exposure IV Contrast no exposure TMP/SMX no exposure Hypotension present currently Balance of 12 systems is negative w/ exceptions as above  PMH  Past Medical History:  Diagnosis Date   CHF (congestive heart failure) (HCC)    Coronary artery disease    COVID-19    Diabetes mellitus without complication ([place])    Hyperlipidemia    NSTEMI (non-ST elevated myocardial infarction) ([place])    Thyroid  disease    PSH  Past Surgical History:  Procedure Laterality Date   CORONARY STENT INTERVENTION N/A 02/02/2021   Procedure: CORONARY STENT INTERVENTION;  Surgeon: Adrian Prows, MD;  Location: [facility];  Service: Cardiovascular;  Laterality: N/A;   KNEE SURGERY Left    RIGHT/LEFT HEART CATH AND CORONARY ANGIOGRAPHY N/A 02/02/2021   Procedure: RIGHT/LEFT HEART CATH AND CORONARY ANGIOGRAPHY;  Surgeon: Adrian Prows, MD;  Location: [facility];  Service: Cardiovascular;  Laterality: N/A;   FH  Family History  Problem Relation Age of Onset   Hyperlipidemia Mother    Cancer Mother    Heart attack Father    Hypertension Father    Heart attack Sister    SH  reports that she quit smoking about 51 years ago. Her smoking use included cigarettes. She has never used smokeless tobacco. She reports that she does not currently use alcohol. No history on file for drug use. Allergies No Known Allergies Home medications Prior to Admission medications   Medication Sig Start Date End Date Taking? Authorizing Provider  aspirin EC 81 MG tablet Take 81 mg by mouth daily. 07/14/15  Yes [provider]  atorvastatin (LIPITOR) 40 MG tablet TAKE 1 TABLET BY MOUTH EVERYDAY AT BEDTIME Patient taking differently: Take 40 mg by mouth daily. 07/20/21  Yes Tolia, Sunit, DO  bisoprolol (ZEBETA) 10 MG tablet Take 10 mg by mouth daily.   Yes [provider]  BRILINTA 90 MG TABS tablet TAKE 1 TABLET BY MOUTH 2 TIMES DAILY. 08/17/21  Yes Tolia, Sunit, DO  glipiZIDE (GLUCOTROL) 10 MG tablet Take 10 mg by mouth 2 (two) times daily before a meal. 05/30/18  Yes [provider]  HUMALOG MIX 75/25 KWIKPEN (75-25) 100 UNIT/ML KwikPen Inject 35 Units into the skin 2 (two) times daily. 03/17/21  Yes [provider]  levothyroxine (SYNTHROID) 88 MCG tablet Take 88 mcg by mouth daily before breakfast. 07/05/18  Yes [provider]  metFORMIN (GLUCOPHAGE) 1000 MG tablet Take 1 tablet  by mouth 2 (two) times a day. 04/28/18  Yes [provider]  Multiple Vitamin (MULTIVITAMIN) capsule Take 1 capsule by mouth daily.   Yes [provider]  oxybutynin (DITROPAN XL) 15 MG 24 hr tablet Take 15 mg by mouth daily. 03/01/21  Yes [provider]  prednisoLONE acetate (PRED FORTE) 1 % ophthalmic suspension Place 1 drop into the right eye daily. 06/09/21  Yes [provider]  sacubitril-valsartan (ENTRESTO) 97-103 MG Take 1 tablet by mouth 2 (two) times daily. 07/17/21  Yes Tolia, Sunit, DO  spironolactone (ALDACTONE) 25 MG tablet TAKE 1/2 TABLET BY MOUTH EVERY DAY 08/24/21  Yes Tolia, Sunit, DO  venlafaxine XR (EFFEXOR-XR) 150 MG 24 hr capsule Take 150 mg by mouth daily with breakfast. 07/03/18  Yes [provider]  ezetimibe (ZETIA) 10 MG tablet TAKE 1 TABLET BY MOUTH EVERY DAY Patient taking differently: Take 10 mg by mouth daily. 07/20/21   Tolia, Sunit, DO  glucose blood (ONETOUCH ULTRA) test strip USE TO TEST 3 TIMES DAILY 06/27/17   [provider]    Current Medications Scheduled Meds:  aspirin EC  81 mg Oral Daily   atorvastatin  40 mg Oral Daily   ezetimibe  10 mg Oral Daily   heparin  5,000 Units Subcutaneous Q8H   insulin aspart  0-15 Units Subcutaneous Q4H   insulin glargine-yfgn  46 Units Subcutaneous BID   levothyroxine  88 mcg Oral Q0600   sodium chloride flush  3 mL Intravenous Q12H   ticagrelor  90 mg Oral BID   venlafaxine XR  150 mg Oral Q breakfast   Continuous Infusions:  sodium chloride     amiodarone 60 mg/hr (10/03/21 1307)   Followed by   amiodarone     ceFEPime (MAXIPIME) IV Stopped (10/02/21 1948)   lactated ringers     phenylephrine (NEO-SYNEPHRINE) Adult infusion 30 mcg/min (10/03/21 1302)   vancomycin     PRN Meds:.acetaminophen **OR** acetaminophen, fentaNYL (SUBLIMAZE) injection, ondansetron **OR** ondansetron (ZOFRAN) IV  CBC Recent Labs  Lab 10/01/21 1945 10/02/21 0420 10/03/21 0955  WBC  17.9* 17.4* 12.4*  NEUTROABS 14.3*  --   --   HGB 11.6* 11.7* 11.5*  HCT 35.1* 37.2 34.5*  MCV 98.0 104.5* 95.6  PLT 296 238 330   Basic Metabolic Panel Recent Labs  Lab 10/01/21 1945 10/02/21 0540 10/02/21 1844 10/03/21 0955  NA 131* 132* 130* 130*  K 4.5 4.2 4.1 3.9  CL 91* 96* 95* 96*  CO2 21* 21* 20* 19*  GLUCOSE 465* 390* 295* 373*  BUN 58* 62* 69* 83*  CREATININE 2.02* 1.95* 2.23* 2.79*  CALCIUM 8.8* 8.1* 8.3* 7.9*    Physical Exam  Blood pressure 96/63, pulse (!) 124, temperature 98.4 F (36.9 C), temperature source Oral, resp. rate (!) 21, weight 104.4 kg, SpO2 97 %. GEN: Elderly, chronically ill-appearing, NAD, interactive ENT: NCAT EYES: EOMI CV: Tacky, irregular, normal S1 and S2 PULM: Diminished in the bases bilaterally ABD: Soft, nontender, bowel sounds present SKIN: Buttock  wound not evaluated EXT: No significant edema NEURO: Awake, alert, interactive, answering questions   Assessment 47F with AKI on CKD3a with septic shock from R buttock infection/cellulitis  AOCKD3a BL SCr 1.0 to 1.5 Likely ATN from sepsis/infectino Has foley, unless sig worsening will not pursue renal imaging; doubt obstruction UA w/o features of GN Septic Shock on PE, 2/2 R buttock cellulitis: per CCM AFib with RVR on Amio Gtt CAD hx/o PCI Chronic HFrEF Mild hyponatremia, monitor  Plan Cont supportive care No RRT needs Daily weights, Daily Renal Panel, Strict I/Os, Avoid nephrotoxins (NSAIDs, judicious IV Contrast)   Arita Miss  10/03/2021, 2:07 PM

## 2021-10-03 NOTE — Consult Note (Addendum)
NAME:  Mallory Butler, MRN:  [id number], DOB:  [birth year], LOS: 2 ADMISSION DATE:  10/01/2021, CONSULTATION DATE:  10/03/21 REFERRING MD:  Wynelle Cleveland, CHIEF COMPLAINT:  shock   History of Present Illness:  Mallory Butler is 34 with a history of obesity, CAD/PCI, diabetes on insulin, hypertension, hyperlipidemia, combined systolic and diastolic CHF.  She was admitted 9/28 with progressive skin infection of the right buttock, first noticed about 5 days prior, enlarging.  Also with some diarrhea, no other focal symptoms.  CT pelvis confirms cellulitis, edema in the gluteus maximus concerning for possible infectious myositis, no overt abscess.  She was admitted and started on broad-spectrum antibiotics, pain control.  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  She has been significantly hyperglycemic, insulin uptitrated. Course today 7/82 complicated by atrial fibrillation with periods of RVR, associated hypotension.  Her blood pressure had initially been stable on a dose of metoprolol was given.  Hypotensive since then.  She is also received narcotics for pain control right buttock. PCCM consulted to evaluate for shock.  On initial evaluation heart rate 100-120, SBP 70s-80s.    Pertinent  Medical History   Past Medical History:  Diagnosis Date   CHF (congestive heart failure) ([place])    Coronary artery disease    COVID-19    Diabetes mellitus without complication ([place])    Hyperlipidemia    NSTEMI (non-ST elevated myocardial infarction) ([place])    Thyroid disease      Significant Hospital Events: Including procedures, antibiotic start and stop dates in addition to other pertinent events     Interim History / Subjective:  Denies dyspnea.  She does complain of right hip and buttock pain.  Objective   Blood pressure (!) 63/36, pulse (!) 128, temperature 98 F (36.7 C), temperature source Rectal, resp. rate 20, weight 104.4 kg, SpO2  97 %.        Intake/Output Summary (Last 24 hours) at 10/03/2021 1303 Last data filed at 10/03/2021 0447 Gross per 24 hour  Intake 303.48 ml  Output --  Net 303.48 ml   Filed Weights   10/03/21 0500  Weight: 104.4 kg    Examination: General: Obese woman, laying in bed.  Somewhat uncomfortable.  No distress HENT: Oropharynx somewhat dry, no erythema.  Strong voice, no secretions.  Pupils equal Lungs: Clear bilaterally Cardiovascular: Irregularly irregular, tachycardic, 110 Abdomen: Obese, nondistended with positive bowel sounds Extremities: An area of eschar/scabbing in the right buttock with some surrounding pale erythema.  No apparent discharge Neuro: Awake, alert, interacting appropriately, answering questions, moving all extremities GU: Unremarkable  Resolved Hospital Problem list     Assessment & Plan:   Septic shock due to R buttock cellulitis -careful volume resuscitation -minimize narcotics as able -will use phenylephrine for BP support in setting new A Fib. Will place CVC if need to up-titrate -follow lactate for clearance -continue broad abx, cefepime + vanco. Will stop metronidazole.   Atrial fib (new) due to underlying sepsis and critical illness -hold any further b-blocker for now given hemodynamics -start amiodarone  DM with hyperglycemia -semglee -SSI -goal CBG < 180  Acute on chronic (stage IIIb) renal failure with AG-metabolic acidosis -follow BMP and UOP   CAD with systolic CHF (EF 95-62%) -zetia -brilinta  -follow troponin to insure no element cardiogenic component   Hypothyroidism -levothyroxine   Depression -on effexor XL    Best Practice (right click and "Reselect all SmartList Selections" daily)  Diet/type: Regular consistency (see orders) DVT prophylaxis: prophylactic heparin  GI prophylaxis: PPI Lines: N/A Foley:  N/A Code Status:  full code Last date of multidisciplinary goals of care discussion [pending]  Labs    CBC: Recent Labs  Lab 10/01/21 1945 10/02/21 0420 10/03/21 0955  WBC 17.9* 17.4* 12.4*  NEUTROABS 14.3*  --   --   HGB 11.6* 11.7* 11.5*  HCT 35.1* 37.2 34.5*  MCV 98.0 104.5* 95.6  PLT 296 238 [id number]    Basic Metabolic Panel: Recent Labs  Lab 10/01/21 1945 10/02/21 0540 10/02/21 1844 10/03/21 0955  NA 131* 132* 130* 130*  K 4.5 4.2 4.1 3.9  CL 91* 96* 95* 96*  CO2 21* 21* 20* 19*  GLUCOSE 465* 390* 295* 373*  BUN 58* 62* 69* 83*  CREATININE 2.02* 1.95* 2.23* 2.79*  CALCIUM 8.8* 8.1* 8.3* 7.9*   GFR: Estimated Creatinine Clearance: 21.7 mL/min (A) (by C-G formula based on SCr of 2.79 mg/dL (H)). Recent Labs  Lab 10/01/21 1945 10/01/21 2211 10/02/21 0420 10/02/21 0540 10/02/21 1844 10/03/21 0955 10/03/21 1140  PROCALCITON  --   --   --  12.27 12.82  --   --   WBC 17.9*  --  17.4*  --   --  12.4*  --   LATICACIDVEN  --  3.9*  --   --  2.2* 2.3* 2.6*    Liver Function Tests: Recent Labs  Lab 10/01/21 1945  AST 30  ALT 19  ALKPHOS 98  BILITOT 0.7  PROT 7.2  ALBUMIN 2.4*   No results for input(s): "LIPASE", "AMYLASE" in the last 168 hours. No results for input(s): "AMMONIA" in the last 168 hours.  ABG    Component Value Date/Time   PHART 7.310 (L) 02/02/2021 0951   PCO2ART 39.5 02/02/2021 0951   PO2ART 128 (H) 02/02/2021 0951   HCO3 19.9 (L) 02/02/2021 0951   TCO2 21 (L) 02/02/2021 0951   ACIDBASEDEF 6.0 (H) 02/02/2021 0951   O2SAT 99.0 02/02/2021 0951     Coagulation Profile: Recent Labs  Lab 10/01/21 1945  INR 1.3*    Cardiac Enzymes: No results for input(s): "CKTOTAL", "CKMB", "CKMBINDEX", "TROPONINI" in the last 168 hours.  HbA1C: Hgb A1c MFr Bld  Date/Time Value Ref Range Status  10/02/2021 04:20 AM 10.4 (H) 4.8 - 5.6 % Final    Comment:    (NOTE) Pre diabetes:          5.7%-6.4%  Diabetes:              >6.4%  Glycemic control for   <7.0% adults with diabetes   02/01/2021 07:59 PM 12.0 (H) 4.8 - 5.6 % Final    Comment:     (NOTE) Pre diabetes:          5.7%-6.4%  Diabetes:              >6.4%  Glycemic control for   <7.0% adults with diabetes     CBG: Recent Labs  Lab 10/02/21 1316 10/02/21 1636 10/02/21 2221 10/03/21 0811 10/03/21 1139  GLUCAP 282* 247* 354* 367* 293*    Review of Systems:   As per HPi  Past Medical History:  She,  has a past medical history of CHF (congestive heart failure) ([place]), Coronary artery disease, COVID-19, Diabetes mellitus without complication ([place]), Hyperlipidemia, NSTEMI (non-ST elevated myocardial infarction) ([place]), and Thyroid disease.   Surgical History:   Past Surgical History:  Procedure Laterality Date   CORONARY STENT INTERVENTION N/A 02/02/2021  Procedure: CORONARY STENT INTERVENTION;  Surgeon: Adrian Prows, MD;  Location: [facility];  Service: Cardiovascular;  Laterality: N/A;   KNEE SURGERY Left    RIGHT/LEFT HEART CATH AND CORONARY ANGIOGRAPHY N/A 02/02/2021   Procedure: RIGHT/LEFT HEART CATH AND CORONARY ANGIOGRAPHY;  Surgeon: Adrian Prows, MD;  Location: [facility];  Service: Cardiovascular;  Laterality: N/A;     Social History:   reports that she quit smoking about 51 years ago. Her smoking use included cigarettes. She has never used smokeless tobacco. She reports that she does not currently use alcohol.   Family History:  Her family history includes Cancer in her mother; Heart attack in her father and sister; Hyperlipidemia in her mother; Hypertension in her father.   Allergies No Known Allergies   Home Medications  Prior to Admission medications   Medication Sig Start Date End Date Taking? Authorizing Provider  aspirin EC 81 MG tablet Take 81 mg by mouth daily. 07/14/15  Yes [provider]  atorvastatin (LIPITOR) 40 MG tablet TAKE 1 TABLET BY MOUTH EVERYDAY AT BEDTIME Patient taking differently: Take 40 mg by mouth daily. 07/20/21  Yes Tolia, Sunit, DO  bisoprolol (ZEBETA) 10 MG tablet Take 10 mg by mouth daily.    Yes [provider]  BRILINTA 90 MG TABS tablet TAKE 1 TABLET BY MOUTH 2 TIMES DAILY. 08/17/21  Yes Tolia, Sunit, DO  glipiZIDE (GLUCOTROL) 10 MG tablet Take 10 mg by mouth 2 (two) times daily before a meal. 05/30/18  Yes [provider]  HUMALOG MIX 75/25 KWIKPEN (75-25) 100 UNIT/ML KwikPen Inject 35 Units into the skin 2 (two) times daily. 03/17/21  Yes [provider]  levothyroxine (SYNTHROID) 88 MCG tablet Take 88 mcg by mouth daily before breakfast. 07/05/18  Yes [provider]  metFORMIN (GLUCOPHAGE) 1000 MG tablet Take 1 tablet by mouth 2 (two) times a day. 04/28/18  Yes [provider]  Multiple Vitamin (MULTIVITAMIN) capsule Take 1 capsule by mouth daily.   Yes [provider]  oxybutynin (DITROPAN XL) 15 MG 24 hr tablet Take 15 mg by mouth daily. 03/01/21  Yes [provider]  prednisoLONE acetate (PRED FORTE) 1 % ophthalmic suspension Place 1 drop into the right eye daily. 06/09/21  Yes [provider]  sacubitril-valsartan (ENTRESTO) 97-103 MG Take 1 tablet by mouth 2 (two) times daily. 07/17/21  Yes Tolia, Sunit, DO  spironolactone (ALDACTONE) 25 MG tablet TAKE 1/2 TABLET BY MOUTH EVERY DAY 08/24/21  Yes Tolia, Sunit, DO  venlafaxine XR (EFFEXOR-XR) 150 MG 24 hr capsule Take 150 mg by mouth daily with breakfast. 07/03/18  Yes [provider]  ezetimibe (ZETIA) 10 MG tablet TAKE 1 TABLET BY MOUTH EVERY DAY Patient taking differently: Take 10 mg by mouth daily. 07/20/21   Tolia, Sunit, DO  glucose blood (ONETOUCH ULTRA) test strip USE TO TEST 3 TIMES DAILY 06/27/17   [provider]     Critical care time: 45 minutes     Baltazar Apo, MD, PhD 10/03/2021, 2:06 PM [facility] [phone] or if no answer before 7:00PM call [phone] For any issues after 7:00PM please call eLink [phone]

## 2021-10-03 NOTE — Progress Notes (Signed)
Date and time results received: 10/03/21 1601  Test: Troponin  Critical Value: 130  Name of Provider Notified: Baltazar Apo, MD  Orders for repeat troponin

## 2021-10-03 NOTE — Progress Notes (Signed)
Received pt from [facility]N. Upon arrival BP 70/36 HR 107, rechecked BP 69/36. O2 sats 88 on 2L [place]. O2 increased to 4L. Extremities cool to touch, faint pulses. CBG 293. Pt A/O x4 though does feel lightheaded. 500cc NS bolus started per order. MD Rizwan notified and came bedside. Rapid called, came bedside. Foley placed and UA sent. Pt transferred to [place] with BP 75/55 HR 120, levophed infusing.

## 2021-10-03 NOTE — Progress Notes (Signed)
An USGPIV (ultrasound guided PIV) has been placed for short-term vasopressor infusion. A correctly placed ivWatch must be used when administering Vasopressors. Should this treatment be needed beyond 72 hours, central line access should be obtained.  It will be the responsibility of the bedside nurse to follow best practice to prevent extravasations.   ?

## 2021-10-03 NOTE — Progress Notes (Signed)
An USGPIV (ultrasound guided PIV) has been placed for short-term vasopressor infusion. A correctly placed ivWatch must be used when administering Vasopressors. Should this treatment be needed beyond 72 hours, central line access should be obtained.  It will be the responsibility of the bedside nurse to follow best practice to prevent extravasations.   PIV placed with U/S, depth 94mm,. Catheter tip marked. Unit RN to place iv watch

## 2021-10-03 NOTE — Progress Notes (Addendum)
Patient was noted with a heart rate sustaining between 144-155 bpm on the dinamap monitor, MD made aware, pt. C/o pain at rate of 10, medicated for pain, IV bolus was also given as ordered: HR is now between 110-125, remains alert and oriented.

## 2021-10-04 DIAGNOSIS — E118 Type 2 diabetes mellitus with unspecified complications: Secondary | ICD-10-CM | POA: Diagnosis not present

## 2021-10-04 DIAGNOSIS — A419 Sepsis, unspecified organism: Secondary | ICD-10-CM | POA: Diagnosis not present

## 2021-10-04 DIAGNOSIS — R739 Hyperglycemia, unspecified: Secondary | ICD-10-CM

## 2021-10-04 DIAGNOSIS — I4891 Unspecified atrial fibrillation: Secondary | ICD-10-CM | POA: Diagnosis not present

## 2021-10-04 LAB — RENAL FUNCTION PANEL
Albumin: 1.5 g/dL — ABNORMAL LOW (ref 3.5–5.0)
Anion gap: 12 (ref 5–15)
BUN: 63 mg/dL — ABNORMAL HIGH (ref 8–23)
CO2: 21 mmol/L — ABNORMAL LOW (ref 22–32)
Calcium: 8.4 mg/dL — ABNORMAL LOW (ref 8.9–10.3)
Chloride: 103 mmol/L (ref 98–111)
Creatinine, Ser: 1.46 mg/dL — ABNORMAL HIGH (ref 0.44–1.00)
GFR, Estimated: 38 mL/min — ABNORMAL LOW (ref >60)
Glucose, Bld: 225 mg/dL — ABNORMAL HIGH (ref 70–99)
Phosphorus: 2.3 mg/dL — ABNORMAL LOW (ref 2.5–4.6)
Potassium: 3.5 mmol/L (ref 3.5–5.1)
Sodium: 136 mmol/L (ref 135–145)

## 2021-10-04 LAB — CBC
HCT: 33.9 % — ABNORMAL LOW (ref 36.0–46.0)
Hemoglobin: 11.1 g/dL — ABNORMAL LOW (ref 12.0–15.0)
MCH: 31.7 pg (ref 26.0–34.0)
MCHC: 32.7 g/dL (ref 30.0–36.0)
MCV: 96.9 fL (ref 80.0–100.0)
Platelets: 379 10*3/uL (ref 150–400)
RBC: 3.5 MIL/uL — ABNORMAL LOW (ref 3.87–5.11)
RDW: 13.2 % (ref 11.5–15.5)
WBC: 15.6 10*3/uL — ABNORMAL HIGH (ref 4.0–10.5)
nRBC: 0 % (ref 0.0–0.2)

## 2021-10-04 LAB — TROPONIN I (HIGH SENSITIVITY)
Troponin I (High Sensitivity): 151 ng/L (ref <18)
Troponin I (High Sensitivity): 110 ng/L (ref <18)
Troponin I (High Sensitivity): 93 ng/L — ABNORMAL HIGH (ref <18)

## 2021-10-04 LAB — GLUCOSE, CAPILLARY
Glucose-Capillary: 219 mg/dL — ABNORMAL HIGH (ref 70–99)
Glucose-Capillary: 221 mg/dL — ABNORMAL HIGH (ref 70–99)
Glucose-Capillary: 249 mg/dL — ABNORMAL HIGH (ref 70–99)
Glucose-Capillary: 199 mg/dL — ABNORMAL HIGH (ref 70–99)
Glucose-Capillary: 282 mg/dL — ABNORMAL HIGH (ref 70–99)

## 2021-10-04 LAB — MAGNESIUM: Magnesium: 2 mg/dL (ref 1.7–2.4)

## 2021-10-04 LAB — HEPARIN LEVEL (UNFRACTIONATED): Heparin Unfractionated: 0.28 IU/mL — ABNORMAL LOW (ref 0.30–0.70)

## 2021-10-04 MED ORDER — ACETAMINOPHEN 500 MG PO TABS
500.0000 mg | ORAL_TABLET | Freq: Four times a day (QID) | ORAL | Status: DC
  Administered 2021-10-04 – 2021-10-06 (×6): 500 mg via ORAL
  Filled 2021-10-04 (×6): qty 1

## 2021-10-04 MED ORDER — SODIUM CHLORIDE 0.9 % IV SOLN
2.0000 g | Freq: Two times a day (BID) | INTRAVENOUS | Status: DC
  Administered 2021-10-04: 2 g via INTRAVENOUS
  Filled 2021-10-04: qty 12.5

## 2021-10-04 MED ORDER — INSULIN ASPART 100 UNIT/ML IJ SOLN: 2.0000 [IU] | Freq: Three times a day (TID) | INTRAMUSCULAR | Status: DC

## 2021-10-04 MED ORDER — AMIODARONE LOAD VIA INFUSION
150.0000 mg | Freq: Once | INTRAVENOUS | Status: AC
  Administered 2021-10-04: 150 mg via INTRAVENOUS
  Filled 2021-10-04: qty 83.34

## 2021-10-04 MED ORDER — MIDODRINE HCL 5 MG PO TABS
5.0000 mg | ORAL_TABLET | Freq: Three times a day (TID) | ORAL | Status: DC
  Administered 2021-10-04 (×2): 5 mg via ORAL
  Filled 2021-10-04 (×2): qty 1

## 2021-10-04 MED ORDER — INSULIN ASPART 100 UNIT/ML IJ SOLN
5.0000 [IU] | Freq: Three times a day (TID) | INTRAMUSCULAR | Status: DC
  Administered 2021-10-04 – 2021-10-07 (×6): 5 [IU] via SUBCUTANEOUS

## 2021-10-04 MED ORDER — OXYCODONE HCL 5 MG PO TABS
5.0000 mg | ORAL_TABLET | Freq: Four times a day (QID) | ORAL | Status: DC | PRN
  Administered 2021-10-04: 10 mg via ORAL
  Administered 2021-10-04: 5 mg via ORAL
  Administered 2021-10-05 – 2021-10-06 (×4): 10 mg via ORAL
  Filled 2021-10-04: qty 1
  Filled 2021-10-04 (×5): qty 2

## 2021-10-04 MED ORDER — LACTATED RINGERS IV BOLUS
500.0000 mL | Freq: Once | INTRAVENOUS | Status: AC
  Administered 2021-10-04: 500 mL via INTRAVENOUS

## 2021-10-04 MED ORDER — MELATONIN 5 MG PO TABS
5.0000 mg | ORAL_TABLET | Freq: Every evening | ORAL | Status: DC | PRN
  Administered 2021-10-07 – 2021-10-10 (×3): 5 mg via ORAL
  Filled 2021-10-04 (×3): qty 1

## 2021-10-04 MED ORDER — MIDODRINE HCL 5 MG PO TABS
10.0000 mg | ORAL_TABLET | Freq: Three times a day (TID) | ORAL | Status: DC
  Administered 2021-10-04 – 2021-10-08 (×11): 10 mg via ORAL
  Filled 2021-10-04 (×11): qty 2

## 2021-10-04 MED ORDER — MORPHINE SULFATE (PF) 2 MG/ML IV SOLN
2.0000 mg | INTRAVENOUS | Status: DC | PRN
  Administered 2021-10-04 – 2021-10-05 (×2): 2 mg via INTRAVENOUS
  Filled 2021-10-04 (×2): qty 1

## 2021-10-04 MED ORDER — INSULIN ASPART 100 UNIT/ML IJ SOLN: 0.0000 [IU] | Freq: Three times a day (TID) | INTRAMUSCULAR | Status: DC

## 2021-10-04 MED ORDER — HEPARIN (PORCINE) 25000 UT/250ML-% IV SOLN
1250.0000 [IU]/h | INTRAVENOUS | Status: DC
  Administered 2021-10-04: 1100 [IU]/h via INTRAVENOUS
  Administered 2021-10-05: 1250 [IU]/h via INTRAVENOUS
  Filled 2021-10-04 (×3): qty 250

## 2021-10-04 MED ORDER — OXYCODONE HCL 5 MG PO TABS
5.0000 mg | ORAL_TABLET | Freq: Four times a day (QID) | ORAL | Status: DC | PRN
  Administered 2021-10-04: 5 mg via ORAL
  Filled 2021-10-04: qty 1

## 2021-10-04 MED ORDER — VANCOMYCIN HCL 1500 MG/300ML IV SOLN: 1500.0000 mg | INTRAVENOUS | Status: DC

## 2021-10-04 MED ORDER — INSULIN ASPART 100 UNIT/ML IJ SOLN: 5.0000 [IU] | Freq: Three times a day (TID) | INTRAMUSCULAR | Status: DC

## 2021-10-04 NOTE — Progress Notes (Signed)
ANTICOAGULATION CONSULT NOTE - Initial Consult  Pharmacy Consult for heparin Indication: atrial fibrillation  No Known Allergies  Patient Measurements: Height: 5\' 3"  (160 cm) Weight: 106.5 kg (234 lb 12.6 oz) IBW/kg (Calculated) : 52.4 HEPARIN DW (KG): 77.8   Vital Signs: Temp: 98.9 F (37.2 C) (09/30 2328) Temp Source: Axillary (09/30 2328) BP: 95/56 (10/01 0900) Pulse Rate: 102 (10/01 0900)  Labs: Recent Labs    10/01/21 1945 10/02/21 0420 10/02/21 0420 10/02/21 0540 10/02/21 1844 10/03/21 0955 10/03/21 1140 10/03/21 1450 10/04/21 0735  HGB 11.6* 11.7*  --   --   --  11.5*  --   --  11.1*  HCT 35.1* 37.2  --   --   --  34.5*  --   --  33.9*  PLT 296 238  --   --   --  330  --   --  379  LABPROT 15.6*  --   --   --   --   --   --   --   --   INR 1.3*  --   --   --   --   --   --   --   --   CREATININE 2.02*  --   --    < > 2.23* 2.79*  --   --  1.46*  TROPONINIHS  --   --    < >  --   --  55* 86* 130* 151*   < > = values in this interval not displayed.    Estimated Creatinine Clearance: 41.9 mL/min (A) (by C-G formula based on SCr of 1.46 mg/dL (H)).   Medical History: Past Medical History:  Diagnosis Date   CHF (congestive heart failure) ([place])    Coronary artery disease    COVID-19    Diabetes mellitus without complication ([place])    Hyperlipidemia    NSTEMI (non-ST elevated myocardial infarction) ([place])    Thyroid disease     Medications:  Medications Prior to Admission  Medication Sig Dispense Refill Last Dose   aspirin EC 81 MG tablet Take 81 mg by mouth daily.   09/28/2021   atorvastatin (LIPITOR) 40 MG tablet TAKE 1 TABLET BY MOUTH EVERYDAY AT BEDTIME (Patient taking differently: Take 40 mg by mouth daily.) 90 tablet 0 09/28/2021   bisoprolol (ZEBETA) 10 MG tablet Take 10 mg by mouth daily.   09/28/2021 at 1000   BRILINTA 90 MG TABS tablet TAKE 1 TABLET BY MOUTH 2 TIMES DAILY. 60 tablet 2 09/28/2021 at pm   glipiZIDE (GLUCOTROL) 10 MG tablet Take 10 mg  by mouth 2 (two) times daily before a meal.   09/28/2021   HUMALOG MIX 75/25 KWIKPEN (75-25) 100 UNIT/ML KwikPen Inject 35 Units into the skin 2 (two) times daily.   09/28/2021   levothyroxine (SYNTHROID) 88 MCG tablet Take 88 mcg by mouth daily before breakfast.   09/28/2021   metFORMIN (GLUCOPHAGE) 1000 MG tablet Take 1 tablet by mouth 2 (two) times a day.   09/28/2021   Multiple Vitamin (MULTIVITAMIN) capsule Take 1 capsule by mouth daily.   UNKNOWN   oxybutynin (DITROPAN XL) 15 MG 24 hr tablet Take 15 mg by mouth daily.   09/28/2021   prednisoLONE acetate (PRED FORTE) 1 % ophthalmic suspension Place 1 drop into the right eye daily.   09/28/2021   sacubitril-valsartan (ENTRESTO) 97-103 MG Take 1 tablet by mouth 2 (two) times daily. 180 tablet 0 09/28/2021   spironolactone (ALDACTONE) 25 MG tablet  TAKE 1/2 TABLET BY MOUTH EVERY DAY 45 tablet 1 09/28/2021   venlafaxine XR (EFFEXOR-XR) 150 MG 24 hr capsule Take 150 mg by mouth daily with breakfast.   09/28/2021   ezetimibe (ZETIA) 10 MG tablet TAKE 1 TABLET BY MOUTH EVERY DAY (Patient taking differently: Take 10 mg by mouth daily.) 90 tablet 0 UNKNOWN   glucose blood (ONETOUCH ULTRA) test strip USE TO TEST 3 TIMES DAILY      Scheduled:   aspirin EC  81 mg Oral Daily   atorvastatin  40 mg Oral Daily   Chlorhexidine Gluconate Cloth  6 each Topical Daily   ezetimibe  10 mg Oral Daily   heparin  5,000 Units Subcutaneous Q8H   insulin aspart  0-15 Units Subcutaneous Q4H   insulin glargine-yfgn  46 Units Subcutaneous BID   levothyroxine  88 mcg Oral Q0600   midodrine  5 mg Oral TID WC   sodium chloride flush  3 mL Intravenous Q12H   ticagrelor  90 mg Oral BID   venlafaxine XR  150 mg Oral Q breakfast    Assessment: 71 yo female with spetic shock due to cellulitis. She is noted wth new onset afib (CHADSVASC= 6). Pharmacy consulted to dose heparin. No anticoagulation noted PTA.  -Hg- 11.1, plt= 136 -heparin sq given at 5am  Goal of Therapy:  Heparin  level 0.3-0.7 units/ml Monitor platelets by anticoagulation protocol: Yes   Plan:  -No heparin bolus due to recent sq heparin  -Start heparin at 1100 units/hr -Heparin level in 8 hours and daily wth CBC daily  Harland German, PharmD Clinical Pharmacist **Pharmacist phone directory can now be found on [email or web address] ([email or web address]).  Listed under [facility].

## 2021-10-04 NOTE — Progress Notes (Signed)
Pharmacy Antibiotic Note  Mallory Butler is a 71 y.o. female with cellulitis  (right buttock). Pharmacy has been consulted for vancomycin and cefepime dosing. -WBC= 15.6, afeb -SCr 2.2> 2.7> 1.4 (BL ~1-1.2)  Plan: -Change Cefepime to 2g q 12 -Change Vancomycin to 1500 q 48h (eAUC 470, scr1.4) -Will follow renal function, cultures and clinical progress   Height: 5\' 3"  (160 cm) Weight: 106.5 kg (234 lb 12.6 oz) IBW/kg (Calculated) : 52.4  Temp (24hrs), Avg:98.1 F (36.7 C), Min:97.7 F (36.5 C), Max:98.9 F (37.2 C)  Recent Labs  Lab 10/01/21 1945 10/01/21 2211 10/02/21 0420 10/02/21 0540 10/02/21 1844 10/03/21 0955 10/03/21 1140 10/03/21 1450 10/04/21 0735  WBC 17.9*  --  17.4*  --   --  12.4*  --   --  15.6*  CREATININE 2.02*  --   --  1.95* 2.23* 2.79*  --   --  1.46*  LATICACIDVEN  --  3.9*  --   --  2.2* 2.3* 2.6* 1.6  --     Estimated Creatinine Clearance: 41.9 mL/min (A) (by C-G formula based on SCr of 1.46 mg/dL (H)).    No Known Allergies  Antimicrobials this admission: Vanc 9/29>> Cefepime 9/29>> Flagyl 9/29>> 9/30    Microbiology results: 9/29 BCx: ngtd  Thank you for allowing pharmacy to be a part of this patient's care.  Hildred Laser, PharmD Clinical Pharmacist **Pharmacist phone directory can now be found on [email or web address] ([email or web address]).  Listed under [facility].

## 2021-10-04 NOTE — Progress Notes (Signed)
NAME:  Mallory Butler, MRN:  [id number], DOB:  [birth year], LOS: 3 ADMISSION DATE:  10/01/2021, CONSULTATION DATE:  10/03/21 REFERRING MD:  Wynelle Cleveland, CHIEF COMPLAINT:  shock   History of Present Illness:  Mallory Butler is 18 with a history of obesity, CAD/PCI, diabetes on insulin, hypertension, hyperlipidemia, combined systolic and diastolic CHF.  She was admitted 9/28 with progressive skin infection of the right buttock, first noticed about 5 days prior, enlarging.  Also with some diarrhea, no other focal symptoms.  CT pelvis confirms cellulitis, edema in the gluteus maximus concerning for possible infectious myositis, no overt abscess.  She was admitted and started on broad-spectrum antibiotics, pain control.  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  She has been significantly hyperglycemic, insulin uptitrated. Course today 3/26 complicated by atrial fibrillation with periods of RVR, associated hypotension.  Her blood pressure had initially been stable on a dose of metoprolol was given.  Hypotensive since then.  She is also received narcotics for pain control right buttock. PCCM consulted to evaluate for shock.  On initial evaluation heart rate 100-120, SBP 70s-80s.    Pertinent  Medical History   Past Medical History:  Diagnosis Date   CHF (congestive heart failure) ([place])    Coronary artery disease    COVID-19    Diabetes mellitus without complication ([place])    Hyperlipidemia    NSTEMI (non-ST elevated myocardial infarction) ([place])    Thyroid disease      Significant Hospital Events: Including procedures, antibiotic start and stop dates in addition to other pertinent events   9/28 admit to [facility] R buttock infection  9/30 PCCM consult, transfer to ICU for septic shock req peripheral neo   Interim History / Subjective:  LA normalized yesterday afternoon  On low dose neo   No AM labs other than CBGs    Objective    Blood pressure (!) 95/56, pulse (!) 102, temperature 98.9 F (37.2 C), temperature source Axillary, resp. rate 19, height 5\' 3"  (1.6 m), weight 106.5 kg, SpO2 98 %.        Intake/Output Summary (Last 24 hours) at 10/04/2021 1030 Last data filed at 10/04/2021 0600 Gross per 24 hour  Intake 245.85 ml  Output 2000 ml  Net -1754.15 ml   Filed Weights   10/03/21 0500 10/04/21 0500  Weight: 104.4 kg 106.5 kg    Examination: General: chronically ill older adult F NAD  HENT: NCAT pink mm anicteric sclera Lungs: CTAb even unlabored.  Cardiovascular: tachycardic, irregular. S1s2  Abdomen: soft ndnt  Extremities: no acute joint deformity no cyanosis or clubbing  Neuro: AAOx3 following commands GU: foley yellow urine   Resolved Hospital Problem list   Lactic acidosis   Assessment & Plan:   Septic shock due to R buttock cellulitis P -cont vanc, cefepime -MAP goal > 65 -- cont neo, wean as able  - add midodrine  -give LR bolus   New onset Afib  -r/t sepsis, critical illness  P -Amio -add hep gtt  -K goal 4 Mag goal 2  DM2 with hyperglycemia  -semglee -SSI -goal CBG < 180  AKI on CKD IIIb  AGMA  -follow BMP and UOP  -- labs pending 10/1  CAD  Hx HFrEF with recovery of EF  -zetia -brilinta   Hyponatremia P -AM labs pending   Hypothyroidism -levothyroxine   Depression  -cont effexor XL    Best Practice (right click and "Reselect  all SmartList Selections" daily)   Diet/type: Regular consistency (see orders) DVT prophylaxis: prophylactic heparin  -- ordering for hep gtt 10/1 GI prophylaxis: PPI Lines: N/A Foley:  N/A Code Status:  full code Last date of multidisciplinary goals of care discussion [pending]  Labs   CBC: Recent Labs  Lab 10/01/21 1945 10/02/21 0420 10/03/21 0955 10/04/21 0735  WBC 17.9* 17.4* 12.4* 15.6*  NEUTROABS 14.3*  --   --   --   HGB 11.6* 11.7* 11.5* 11.1*  HCT 35.1* 37.2 34.5* 33.9*  MCV 98.0 104.5* 95.6 96.9  PLT 296  238 330 [id number]    Basic Metabolic Panel: Recent Labs  Lab 10/01/21 1945 10/02/21 0540 10/02/21 1844 10/03/21 0955 10/03/21 1140 10/04/21 0735  NA 131* 132* 130* 130*  --  136  K 4.5 4.2 4.1 3.9  --  3.5  CL 91* 96* 95* 96*  --  103  CO2 21* 21* 20* 19*  --  21*  GLUCOSE 465* 390* 295* 373*  --  225*  BUN 58* 62* 69* 83*  --  63*  CREATININE 2.02* 1.95* 2.23* 2.79*  --  1.46*  CALCIUM 8.8* 8.1* 8.3* 7.9*  --  8.4*  MG  --   --   --   --  2.1 2.0  PHOS  --   --   --   --   --  2.3*   GFR: Estimated Creatinine Clearance: 41.9 mL/min (A) (by C-G formula based on SCr of 1.46 mg/dL (H)). Recent Labs  Lab 10/01/21 1945 10/01/21 2211 10/02/21 0420 10/02/21 0540 10/02/21 1844 10/03/21 0955 10/03/21 1140 10/03/21 1450 10/04/21 0735  PROCALCITON  --   --   --  12.27 12.82  --   --   --   --   WBC 17.9*  --  17.4*  --   --  12.4*  --   --  15.6*  LATICACIDVEN  --    < >  --   --  2.2* 2.3* 2.6* 1.6  --    < > = values in this interval not displayed.    Liver Function Tests: Recent Labs  Lab 10/01/21 1945 10/04/21 0735  AST 30  --   ALT 19  --   ALKPHOS 98  --   BILITOT 0.7  --   PROT 7.2  --   ALBUMIN 2.4* 1.5*   No results for input(s): "LIPASE", "AMYLASE" in the last 168 hours. No results for input(s): "AMMONIA" in the last 168 hours.  ABG    Component Value Date/Time   PHART 7.310 (L) 02/02/2021 0951   PCO2ART 39.5 02/02/2021 0951   PO2ART 128 (H) 02/02/2021 0951   HCO3 19.9 (L) 02/02/2021 0951   TCO2 21 (L) 02/02/2021 0951   ACIDBASEDEF 6.0 (H) 02/02/2021 0951   O2SAT 99.0 02/02/2021 0951     Coagulation Profile: Recent Labs  Lab 10/01/21 1945  INR 1.3*    Cardiac Enzymes: No results for input(s): "CKTOTAL", "CKMB", "CKMBINDEX", "TROPONINI" in the last 168 hours.  HbA1C: Hgb A1c MFr Bld  Date/Time Value Ref Range Status  10/02/2021 04:20 AM 10.4 (H) 4.8 - 5.6 % Final    Comment:    (NOTE) Pre diabetes:          5.7%-6.4%  Diabetes:               >6.4%  Glycemic control for   <7.0% adults with diabetes   02/01/2021 07:59 PM 12.0 (H) 4.8 - 5.6 %  Final    Comment:    (NOTE) Pre diabetes:          5.7%-6.4%  Diabetes:              >6.4%  Glycemic control for   <7.0% adults with diabetes     CBG: Recent Labs  Lab 10/03/21 1928 10/03/21 2002 10/03/21 2311 10/04/21 0345 10/04/21 0849  GLUCAP 270* 247* 234* 219* 221*    CRITICAL CARE Performed by: Cristal Generous   Total critical care time: 42 minutes  Critical care time was exclusive of separately billable procedures and treating other patients. Critical care was necessary to treat or prevent imminent or life-threatening deterioration.  Critical care was time spent personally by me on the following activities: development of treatment plan with patient and/or surrogate as well as nursing, discussions with consultants, evaluation of patient's response to treatment, examination of patient, obtaining history from patient or surrogate, ordering and performing treatments and interventions, ordering and review of laboratory studies, ordering and review of radiographic studies, pulse oximetry and re-evaluation of patient's condition.  Eliseo Gum MSN, AGACNP-BC [place] for pager  10/04/2021, 10:30 AM
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
## 2021-10-04 NOTE — Consult Note (Signed)
Mallory Butler 08-22-1950  [id number].    Requesting MD: Dr. Noemi Chapel Chief Complaint/Reason for Consult: cellulitis  HPI:  Mallory Butler is a 71 yo female with a history of CHF, DM, CAD, HTN and HLD who was admitted on 9/28 with cellulitis of the right buttock. She says that about a week ago she noticed a small knot in the area, and it has progressed since then. A CT of the pelvis on 9/28 showed subcutaneous stranding but no soft tissue gas or abscess. She was started on broad-spectrum antibiotics. Yesterday she developed a-fib, and today became hypotensive and was transferred to the ICU. Surgery was consulted to evaluate for possible abscess or need for wound debridement. The patient reports the pain has been about the same since admission. She has not noticed any drainage from the area.  ROS: Review of Systems  Constitutional:  Negative for chills and fever.  Gastrointestinal:        R buttock pain    Family History  Problem Relation Age of Onset   Hyperlipidemia Mother    Cancer Mother    Heart attack Father    Hypertension Father    Heart attack Sister     Past Medical History:  Diagnosis Date   CHF (congestive heart failure) ([place])    Coronary artery disease    COVID-19    Diabetes mellitus without complication ([place])    Hyperlipidemia    NSTEMI (non-ST elevated myocardial infarction) ([place])    Thyroid disease     Past Surgical History:  Procedure Laterality Date   CORONARY STENT INTERVENTION N/A 02/02/2021   Procedure: CORONARY STENT INTERVENTION;  Surgeon: Adrian Prows, MD;  Location: [facility];  Service: Cardiovascular;  Laterality: N/A;   KNEE SURGERY Left    RIGHT/LEFT HEART CATH AND CORONARY ANGIOGRAPHY N/A 02/02/2021   Procedure: RIGHT/LEFT HEART CATH AND CORONARY ANGIOGRAPHY;  Surgeon: Adrian Prows, MD;  Location: [facility];  Service: Cardiovascular;  Laterality: N/A;    Social History:  reports that she quit smoking about 51 years ago. Her  smoking use included cigarettes. She has never used smokeless tobacco. She reports that she does not currently use alcohol. No history on file for drug use.  Allergies: No Known Allergies  Medications Prior to Admission  Medication Sig Dispense Refill   aspirin EC 81 MG tablet Take 81 mg by mouth daily.     atorvastatin (LIPITOR) 40 MG tablet TAKE 1 TABLET BY MOUTH EVERYDAY AT BEDTIME (Patient taking differently: Take 40 mg by mouth daily.) 90 tablet 0   bisoprolol (ZEBETA) 10 MG tablet Take 10 mg by mouth daily.     BRILINTA 90 MG TABS tablet TAKE 1 TABLET BY MOUTH 2 TIMES DAILY. 60 tablet 2   glipiZIDE (GLUCOTROL) 10 MG tablet Take 10 mg by mouth 2 (two) times daily before a meal.     HUMALOG MIX 75/25 KWIKPEN (75-25) 100 UNIT/ML KwikPen Inject 35 Units into the skin 2 (two) times daily.     levothyroxine (SYNTHROID) 88 MCG tablet Take 88 mcg by mouth daily before breakfast.     metFORMIN (GLUCOPHAGE) 1000 MG tablet Take 1 tablet by mouth 2 (two) times a day.     Multiple Vitamin (MULTIVITAMIN) capsule Take 1 capsule by mouth daily.     oxybutynin (DITROPAN XL) 15 MG 24 hr tablet Take 15 mg by mouth daily.     prednisoLONE acetate (PRED FORTE) 1 % ophthalmic suspension Place 1 drop into the  right eye daily.     sacubitril-valsartan (ENTRESTO) 97-103 MG Take 1 tablet by mouth 2 (two) times daily. 180 tablet 0   spironolactone (ALDACTONE) 25 MG tablet TAKE 1/2 TABLET BY MOUTH EVERY DAY 45 tablet 1   venlafaxine XR (EFFEXOR-XR) 150 MG 24 hr capsule Take 150 mg by mouth daily with breakfast.     ezetimibe (ZETIA) 10 MG tablet TAKE 1 TABLET BY MOUTH EVERY DAY (Patient taking differently: Take 10 mg by mouth daily.) 90 tablet 0   glucose blood (ONETOUCH ULTRA) test strip USE TO TEST 3 TIMES DAILY       Physical Exam: Blood pressure 125/74, pulse (!) 112, temperature 98.7 F (37.1 C), temperature source Oral, resp. rate (!) 24, height 5\' 3"  (1.6 m), weight 106.5 kg, SpO2 91 %. General: resting  comfortably, appears stated age, no apparent distress Neurological: alert and oriented, no focal deficits, cranial nerves grossly in tact HEENT: normocephalic, atraumatic CV: regular rate and rhythm Respiratory: normal work of breathing, symmetric chest wall expansion. Anorectal: erythema and mild induration of right buttock, small eschar approximately 2cm, no crepitus or fluctuance. Extremities: warm and well-perfused, no deformities, moving all extremities spontaneously Psychiatric: normal mood and affect Skin: warm and dry, no jaundice, no rashes or lesions   Results for orders placed or performed during the hospital encounter of 10/01/21 (from the past 48 hour(s))  CBG monitoring, ED     Status: Abnormal   Collection Time: 10/02/21  4:36 PM  Result Value Ref Range   Glucose-Capillary 247 (H) 70 - 99 mg/dL    Comment: Glucose reference range applies only to samples taken after fasting for at least 8 hours.  Lactic acid, plasma     Status: Abnormal   Collection Time: 10/02/21  6:44 PM  Result Value Ref Range   Lactic Acid, Venous 2.2 (HH) 0.5 - 1.9 mmol/L    Comment: CRITICAL VALUE NOTED. VALUE IS CONSISTENT WITH PREVIOUSLY REPORTED/CALLED VALUE Performed at [facility], [place] [address], [place], [place] [id number]   Basic metabolic panel     Status: Abnormal   Collection Time: 10/02/21  6:44 PM  Result Value Ref Range   Sodium 130 (L) 135 - 145 mmol/L   Potassium 4.1 3.5 - 5.1 mmol/L   Chloride 95 (L) 98 - 111 mmol/L   CO2 20 (L) 22 - 32 mmol/L   Glucose, Bld 295 (H) 70 - 99 mg/dL    Comment: Glucose reference range applies only to samples taken after fasting for at least 8 hours.   BUN 69 (H) 8 - 23 mg/dL   Creatinine, Ser 2.23 (H) 0.44 - 1.00 mg/dL   Calcium 8.3 (L) 8.9 - 10.3 mg/dL   GFR, Estimated 23 (L) >60 mL/min    Comment: (NOTE) Calculated using the CKD-EPI Creatinine Equation (2021)    Anion gap 15 5 - 15    Comment: Performed at [facility] [address], [place], [place] [address]  Procalcitonin     Status: None   Collection Time: 10/02/21  6:44 PM  Result Value Ref Range   Procalcitonin 12.82 ng/mL    Comment:        Interpretation: PCT >= 10 ng/mL: Important systemic inflammatory response, almost exclusively due to severe bacterial sepsis or septic shock. (NOTE)       Sepsis PCT Algorithm           Lower Respiratory Tract  Infection PCT Algorithm    ----------------------------     ----------------------------         PCT < 0.25 ng/mL                PCT < 0.10 ng/mL          Strongly encourage             Strongly discourage   discontinuation of antibiotics    initiation of antibiotics    ----------------------------     -----------------------------       PCT 0.25 - 0.50 ng/mL            PCT 0.10 - 0.25 ng/mL               OR       >80% decrease in PCT            Discourage initiation of                                            antibiotics      Encourage discontinuation           of antibiotics    ----------------------------     -----------------------------         PCT >= 0.50 ng/mL              PCT 0.26 - 0.50 ng/mL                AND       <80% decrease in PCT             Encourage initiation of                                             antibiotics       Encourage continuation           of antibiotics    ----------------------------     -----------------------------        PCT >= 0.50 ng/mL                  PCT > 0.50 ng/mL               AND         increase in PCT                  Strongly encourage                                      initiation of antibiotics    Strongly encourage escalation           of antibiotics                                     -----------------------------                                           PCT <= 0.25 ng/mL  OR                                        > 80% decrease in PCT                                       Discontinue / Do not initiate                                             antibiotics  Performed at [facility], [place] [address], [place], [place] [address]   Beta-hydroxybutyric acid     Status: None   Collection Time: 10/02/21  6:44 PM  Result Value Ref Range   Beta-Hydroxybutyric Acid 0.26 0.05 - 0.27 mmol/L    Comment: Performed at [facility] [address] [place], [place] [address]  Glucose, capillary     Status: Abnormal   Collection Time: 10/02/21 10:21 PM  Result Value Ref Range   Glucose-Capillary 354 (H) 70 - 99 mg/dL    Comment: Glucose reference range applies only to samples taken after fasting for at least 8 hours.  Glucose, capillary     Status: Abnormal   Collection Time: 10/03/21  8:11 AM  Result Value Ref Range   Glucose-Capillary 367 (H) 70 - 99 mg/dL    Comment: Glucose reference range applies only to samples taken after fasting for at least 8 hours.  Basic metabolic panel     Status: Abnormal   Collection Time: 10/03/21  9:55 AM  Result Value Ref Range   Sodium 130 (L) 135 - 145 mmol/L   Potassium 3.9 3.5 - 5.1 mmol/L   Chloride 96 (L) 98 - 111 mmol/L   CO2 19 (L) 22 - 32 mmol/L   Glucose, Bld 373 (H) 70 - 99 mg/dL    Comment: Glucose reference range applies only to samples taken after fasting for at least 8 hours.   BUN 83 (H) 8 - 23 mg/dL   Creatinine, Ser 2.79 (H) 0.44 - 1.00 mg/dL   Calcium 7.9 (L) 8.9 - 10.3 mg/dL   GFR, Estimated 18 (L) >60 mL/min    Comment: (NOTE) Calculated using the CKD-EPI Creatinine Equation (2021)    Anion gap 15 5 - 15    Comment: Performed at [facility] [address], [place], [place] [address]  CBC     Status: Abnormal   Collection Time: 10/03/21  9:55 AM  Result Value Ref Range   WBC 12.4 (H) 4.0 - 10.5 K/uL   RBC 3.61 (L) 3.87 - 5.11 MIL/uL   Hemoglobin 11.5 (L) 12.0 - 15.0 g/dL   HCT 34.5 (L) 36.0 - 46.0 %   MCV 95.6 80.0 - 100.0 fL    Comment: REPEATED TO  VERIFY DELTA CHECK NOTED    MCH 31.9 26.0 - 34.0 pg   MCHC 33.3 30.0 - 36.0 g/dL   RDW 12.9 11.5 - 15.5 %   Platelets 330 150 - 400 K/uL   nRBC 0.0 0.0 - 0.2 %    Comment: Performed at [facility], [place] [address], [place], [place] [address]  Lactic acid, plasma     Status: Abnormal  Collection Time: 10/03/21  9:55 AM  Result Value Ref Range   Lactic Acid, Venous 2.3 (HH) 0.5 - 1.9 mmol/L    Comment: CRITICAL VALUE NOTED. VALUE IS CONSISTENT WITH PREVIOUSLY REPORTED/CALLED VALUE Performed at [facility], [place] [address] [place], [place] [address]   Troponin I (High Sensitivity)     Status: Abnormal   Collection Time: 10/03/21  9:55 AM  Result Value Ref Range   Troponin I (High Sensitivity) 55 (H) <18 ng/L    Comment: (NOTE) Elevated high sensitivity troponin I (hsTnI) values and significant  changes across serial measurements may suggest ACS but many other  chronic and acute conditions are known to elevate hsTnI results.  Refer to the "Links" section for chest pain algorithms and additional  guidance. Performed at [facility], [place] [address], [place], [place] [address]   Glucose, capillary     Status: Abnormal   Collection Time: 10/03/21 11:39 AM  Result Value Ref Range   Glucose-Capillary 293 (H) 70 - 99 mg/dL    Comment: Glucose reference range applies only to samples taken after fasting for at least 8 hours.  Lactic acid, plasma     Status: Abnormal   Collection Time: 10/03/21 11:40 AM  Result Value Ref Range   Lactic Acid, Venous 2.6 (HH) 0.5 - 1.9 mmol/L    Comment: CRITICAL VALUE NOTED. VALUE IS CONSISTENT WITH PREVIOUSLY REPORTED/CALLED VALUE Performed at [facility], [place] [address] [place], [place] [address]   Troponin I (High Sensitivity)     Status: Abnormal   Collection Time: 10/03/21 11:40 AM  Result Value Ref Range   Troponin I (High Sensitivity) 86 (H) <18 ng/L    Comment: RESULT CALLED TO, READ BACK BY AND VERIFIED WITH C.  ZEILMAN RN 10/03/21 @1310  BY J. WHITE (NOTE) Elevated high sensitivity troponin I (hsTnI) values and significant  changes across serial measurements may suggest ACS but many other  chronic and acute conditions are known to elevate hsTnI results.  Refer to the "Links" section for chest pain algorithms and additional  guidance. Performed at [facility], [place] [address] [place], [place] [address]   Magnesium     Status: None   Collection Time: 10/03/21 11:40 AM  Result Value Ref Range   Magnesium 2.1 1.7 - 2.4 mg/dL    Comment: Performed at [facility] [address] [place], [place] [address]  Urinalysis, Routine w reflex microscopic Urine, Catheterized     Status: Abnormal   Collection Time: 10/03/21 11:57 AM  Result Value Ref Range   Color, Urine AMBER (A) YELLOW    Comment: BIOCHEMICALS MAY BE AFFECTED BY COLOR   APPearance HAZY (A) CLEAR   Specific Gravity, Urine 1.023 1.005 - 1.030   pH 5.0 5.0 - 8.0   Glucose, UA 50 (A) NEGATIVE mg/dL   Hgb urine dipstick NEGATIVE NEGATIVE   Bilirubin Urine NEGATIVE NEGATIVE   Ketones, ur 5 (A) NEGATIVE mg/dL   Protein, ur NEGATIVE NEGATIVE mg/dL   Nitrite NEGATIVE NEGATIVE   Leukocytes,Ua TRACE (A) NEGATIVE   RBC / HPF 0-5 0 - 5 RBC/hpf   WBC, UA 0-5 0 - 5 WBC/hpf   Bacteria, UA RARE (A) NONE SEEN   Squamous Epithelial / LPF 0-5 0 - 5   Mucus PRESENT    Amorphous Crystal PRESENT     Comment: Performed at [facility], [address], [place], [place] [address]  Glucose, capillary  Status: Abnormal   Collection Time: 10/03/21  1:23 PM  Result Value Ref Range   Glucose-Capillary 266 (H) 70 - 99 mg/dL    Comment: Glucose reference range applies only to samples taken after fasting for at least 8 hours.  Lactic acid, plasma     Status: None   Collection Time: 10/03/21  2:50 PM  Result Value Ref Range   Lactic Acid, Venous 1.6 0.5 - 1.9 mmol/L    Comment: Performed at [facility], [place] [address]  [place], [place] [address]  Troponin I (High Sensitivity)     Status: Abnormal   Collection Time: 10/03/21  2:50 PM  Result Value Ref Range   Troponin I (High Sensitivity) 130 (HH) <18 ng/L    Comment: CRITICAL RESULT CALLED TO, READ BACK BY AND VERIFIED WITH C. ZERMIL RN 10/03/21 @1601  BY J. WHITE (NOTE) Elevated high sensitivity troponin I (hsTnI) values and significant  changes across serial measurements may suggest ACS but many other  chronic and acute conditions are known to elevate hsTnI results.  Refer to the "Links" section for chest pain algorithms and additional  guidance. Performed at [facility], [place] [address], [place], [place] [address]   Glucose, capillary     Status: Abnormal   Collection Time: 10/03/21  3:59 PM  Result Value Ref Range   Glucose-Capillary 272 (H) 70 - 99 mg/dL    Comment: Glucose reference range applies only to samples taken after fasting for at least 8 hours.  Glucose, capillary     Status: Abnormal   Collection Time: 10/03/21  7:28 PM  Result Value Ref Range   Glucose-Capillary 270 (H) 70 - 99 mg/dL    Comment: Glucose reference range applies only to samples taken after fasting for at least 8 hours.  Glucose, capillary     Status: Abnormal   Collection Time: 10/03/21  8:02 PM  Result Value Ref Range   Glucose-Capillary 247 (H) 70 - 99 mg/dL    Comment: Glucose reference range applies only to samples taken after fasting for at least 8 hours.  Glucose, capillary     Status: Abnormal   Collection Time: 10/03/21 11:11 PM  Result Value Ref Range   Glucose-Capillary 234 (H) 70 - 99 mg/dL    Comment: Glucose reference range applies only to samples taken after fasting for at least 8 hours.  Glucose, capillary     Status: Abnormal   Collection Time: 10/04/21  3:45 AM  Result Value Ref Range   Glucose-Capillary 219 (H) 70 - 99 mg/dL    Comment: Glucose reference range applies only to samples taken after fasting for at least 8 hours.  CBC     Status:  Abnormal   Collection Time: 10/04/21  7:35 AM  Result Value Ref Range   WBC 15.6 (H) 4.0 - 10.5 K/uL   RBC 3.50 (L) 3.87 - 5.11 MIL/uL   Hemoglobin 11.1 (L) 12.0 - 15.0 g/dL   HCT 33.9 (L) 36.0 - 46.0 %   MCV 96.9 80.0 - 100.0 fL   MCH 31.7 26.0 - 34.0 pg   MCHC 32.7 30.0 - 36.0 g/dL   RDW 13.2 11.5 - 15.5 %   Platelets 379 150 - 400 K/uL   nRBC 0.0 0.0 - 0.2 %    Comment: Performed at [facility], [place] [address] [place], [place] [address]  Renal function panel     Status: Abnormal   Collection Time: 10/04/21  7:35 AM  Result Value Ref Range   Sodium 136 135 - 145 mmol/L   Potassium 3.5 3.5 - 5.1 mmol/L   Chloride 103 98 - 111 mmol/L   CO2 21 (L) 22 - 32 mmol/L   Glucose, Bld 225 (H) 70 - 99 mg/dL    Comment: Glucose reference range applies only to samples taken after fasting for at least 8 hours.   BUN 63 (H) 8 - 23 mg/dL   Creatinine, Ser 1.46 (H) 0.44 - 1.00 mg/dL    Comment: DELTA CHECK NOTED   Calcium 8.4 (L) 8.9 - 10.3 mg/dL   Phosphorus 2.3 (L) 2.5 - 4.6 mg/dL   Albumin 1.5 (L) 3.5 - 5.0 g/dL   GFR, Estimated 38 (L) >60 mL/min    Comment: (NOTE) Calculated using the CKD-EPI Creatinine Equation (2021)    Anion gap 12 5 - 15    Comment: Performed at [facility] [address], [place], [place] [address]  Magnesium     Status: None   Collection Time: 10/04/21  7:35 AM  Result Value Ref Range   Magnesium 2.0 1.7 - 2.4 mg/dL    Comment: Performed at [facility] [address] [place], [place] [address]  Troponin I (High Sensitivity)     Status: Abnormal   Collection Time: 10/04/21  7:35 AM  Result Value Ref Range   Troponin I (High Sensitivity) 151 (HH) <18 ng/L    Comment: CRITICAL VALUE NOTED. VALUE IS CONSISTENT WITH PREVIOUSLY REPORTED/CALLED VALUE (NOTE) Elevated high sensitivity troponin I (hsTnI) values and significant  changes across serial measurements may suggest ACS but many other  chronic and acute conditions are known to elevate  hsTnI results.  Refer to the "Links" section for chest pain algorithms and additional  guidance. Performed at [facility], [place] [address] [place], [place] [address]   Glucose, capillary     Status: Abnormal   Collection Time: 10/04/21  8:49 AM  Result Value Ref Range   Glucose-Capillary 221 (H) 70 - 99 mg/dL    Comment: Glucose reference range applies only to samples taken after fasting for at least 8 hours.  Glucose, capillary     Status: Abnormal   Collection Time: 10/04/21 11:12 AM  Result Value Ref Range   Glucose-Capillary 249 (H) 70 - 99 mg/dL    Comment: Glucose reference range applies only to samples taken after fasting for at least 8 hours.  Troponin I (High Sensitivity)     Status: Abnormal   Collection Time: 10/04/21  2:16 PM  Result Value Ref Range   Troponin I (High Sensitivity) 110 (HH) <18 ng/L    Comment: CRITICAL VALUE NOTED. VALUE IS CONSISTENT WITH PREVIOUSLY REPORTED/CALLED VALUE (NOTE) Elevated high sensitivity troponin I (hsTnI) values and significant  changes across serial measurements may suggest ACS but many other  chronic and acute conditions are known to elevate hsTnI results.  Refer to the "Links" section for chest pain algorithms and additional  guidance. Performed at [facility], [place] [address] [place], [place] [address]    ECHOCARDIOGRAM COMPLETE  Result Date: 10/03/2021    ECHOCARDIOGRAM REPORT   Patient Name:   Mallory Butler Date of Exam: 10/03/2021 Medical Rec #:  [id number]     Height:       63.0 in Accession #:    [id number]    Weight:       230.2 lb Date of Birth:  01/18/1950     BSA:  2.053 m Patient Age:    83 years      BP:           98/60 mmHg Patient Gender: F             HR:           112 bpm. Exam Location:  Inpatient Procedure: 2D Echo, Cardiac Doppler and Color Doppler Indications:    Congestive heart failure  History:        Patient has prior history of Echocardiogram examinations, most                 recent  01/29/2021. CHF, CAD; Risk Factors:Diabetes and                 Dyslipidemia.  Sonographer:    Jefferey Pica Referring Phys: [place]  1. Left ventricular ejection fraction, by estimation, is 60 to 65%. The left ventricle has normal function. The left ventricle demonstrates regional wall motion abnormalities (see scoring diagram/findings for description). There is moderate concentric left ventricular hypertrophy. Left ventricular diastolic parameters are indeterminate.  2. Right ventricular systolic function is normal. The right ventricular size is normal. There is normal pulmonary artery systolic pressure. The estimated right ventricular systolic pressure is [id number] mmHg.  3. Left atrial size was moderately dilated.  4. The mitral valve is abnormal. Trivial mitral valve regurgitation.  5. The aortic valve is tricuspid. Aortic valve regurgitation is not visualized. Aortic valve sclerosis/calcification is present, without any evidence of aortic stenosis.  6. The inferior vena cava is normal in size with greater than 50% respiratory variability, suggesting right atrial pressure of 3 mmHg. Comparison(s): Prior images reviewed side by side. Overall LVEF has normalized in the range of 60-65%. Also relative improvement in wall motion abnormalities. FINDINGS  Left Ventricle: Left ventricular ejection fraction, by estimation, is 60 to 65%. The left ventricle has normal function. The left ventricle demonstrates regional wall motion abnormalities. The left ventricular internal cavity size was normal in size. There is moderate concentric left ventricular hypertrophy. Left ventricular diastolic parameters are indeterminate.  LV Wall Scoring: The mid and distal anterior septum and apex are hypokinetic. The entire anterior wall, entire lateral wall, entire inferior wall, basal anteroseptal segment, mid inferoseptal segment, and basal inferoseptal segment are normal. Right Ventricle: The right ventricular size  is normal. No increase in right ventricular wall thickness. Right ventricular systolic function is normal. There is normal pulmonary artery systolic pressure. The tricuspid regurgitant velocity is 2.04 m/s, and  with an assumed right atrial pressure of 3 mmHg, the estimated right ventricular systolic pressure is [id number] mmHg. Left Atrium: Left atrial size was moderately dilated. Right Atrium: Right atrial size was normal in size. Pericardium: There is no evidence of pericardial effusion. Presence of epicardial fat layer. Mitral Valve: The mitral valve is abnormal. There is mild thickening of the mitral valve leaflet(s). There is mild calcification of the mitral valve leaflet(s). Mild mitral annular calcification. Trivial mitral valve regurgitation. Tricuspid Valve: The tricuspid valve is grossly normal. Tricuspid valve regurgitation is mild. Aortic Valve: The aortic valve is tricuspid. There is mild aortic valve annular calcification. Aortic valve regurgitation is not visualized. Aortic valve sclerosis/calcification is present, without any evidence of aortic stenosis. Aortic valve peak gradient measures 8.3 mmHg. Pulmonic Valve: The pulmonic valve was grossly normal. Pulmonic valve regurgitation is trivial. Aorta: The aortic root is normal in size and structure. Venous: The inferior vena cava is normal in size with greater than  50% respiratory variability, suggesting right atrial pressure of 3 mmHg. IAS/Shunts: No atrial level shunt detected by color flow Doppler.  LEFT VENTRICLE PLAX 2D LVIDd:         4.50 cm LVIDs:         2.60 cm LV PW:         1.30 cm LV IVS:        1.30 cm LVOT diam:     2.10 cm LV SV:         70 LV SV Index:   34 LVOT Area:     3.46 cm  RIGHT VENTRICLE             IVC RV Basal diam:  2.60 cm     IVC diam: 1.70 cm RV S prime:     11.50 cm/s TAPSE (M-mode): 2.2 cm LEFT ATRIUM             Index        RIGHT ATRIUM           Index LA diam:        4.40 cm 2.14 cm/m   RA Area:     13.20 cm LA Vol  (A2C):   76.1 ml 37.08 ml/m  RA Volume:   28.15 ml  13.71 ml/m LA Vol (A4C):   86.5 ml 42.14 ml/m LA Biplane Vol: 82.9 ml 40.39 ml/m  AORTIC VALVE                 PULMONIC VALVE AV Area (Vmax): 3.00 cm     PV Vmax:       0.77 m/s AV Vmax:        143.80 cm/s  PV Peak grad:  2.3 mmHg AV Peak Grad:   8.3 mmHg LVOT Vmax:      124.50 cm/s LVOT Vmean:     76.950 cm/s LVOT VTI:       0.202 m  AORTA Ao Root diam: 3.10 cm Ao Asc diam:  3.50 cm MITRAL VALVE               TRICUSPID VALVE MV Area (PHT): 6.79 cm    TR Peak grad:   16.6 mmHg MV Decel Time: 112 msec    TR Vmax:        204.00 cm/s MV E velocity: 97.07 cm/s                            SHUNTS                            Systemic VTI:  0.20 m                            Systemic Diam: 2.10 cm Rozann Lesches MD Electronically signed by Rozann Lesches MD Signature Date/Time: 10/03/2021/3:05:32 PM    Final       Assessment/Plan This is a 71 yo female admitted with cellulitis of the right buttock. She was transferred to the ICU today with a-fib and hypotension. I personally reviewed her CT from 9/28, which shows no drainable abscess. On exam today she has ongoing cellulitis but no fluctuance to suggest an abscess, and no crepitus or bullae. The small eschar may benefit from debridement if it continues to enlarge, but I do not feel there is any role for surgical debridement at this time. Agree  with continuing broad-spectrum antibiotics. We will follow.   Michaelle Birks, [facility], Hepatobiliary and Pancreatic Surgery 10/04/21 3:27 PM

## 2021-10-04 NOTE — Progress Notes (Signed)
Admit: 10/01/2021 LOS: 3  Assessment 76F with AKI on CKD3a with septic shock from R buttock infection/cellulitis  Subjective:  Pain overnight, anxiety too Great UOP AM Labs SCr improved to 1.46, K 3.5, HCO3 21 Remains on PE  09/30 0701 - 10/01 0700 In: 245.9 [I.V.:232.5; IV Piggyback:13.3] Out: 2000 [Urine:2000]  Filed Weights   10/03/21 0500 10/04/21 0500  Weight: 104.4 kg 106.5 kg    Scheduled Meds:  aspirin EC  81 mg Oral Daily   atorvastatin  40 mg Oral Daily   Chlorhexidine Gluconate Cloth  6 each Topical Daily   ezetimibe  10 mg Oral Daily   heparin  5,000 Units Subcutaneous Q8H   insulin aspart  0-15 Units Subcutaneous Q4H   insulin glargine-yfgn  46 Units Subcutaneous BID   levothyroxine  88 mcg Oral Q0600   midodrine  5 mg Oral TID WC   sodium chloride flush  3 mL Intravenous Q12H   ticagrelor  90 mg Oral BID   venlafaxine XR  150 mg Oral Q breakfast   Continuous Infusions:  sodium chloride 250 mL (10/03/21 2200)   amiodarone 30 mg/hr (10/04/21 0755)   ceFEPime (MAXIPIME) IV 2 g (10/03/21 1711)   lactated ringers     lactated ringers     phenylephrine (NEO-SYNEPHRINE) Adult infusion 80 mcg/min (10/04/21 0754)   vancomycin 1,250 mg (10/03/21 2202)   PRN Meds:.acetaminophen **OR** acetaminophen, fentaNYL (SUBLIMAZE) injection, ondansetron **OR** ondansetron (ZOFRAN) IV, oxyCODONE  Current Labs: reviewed    Physical Exam:  Blood pressure 96/70, pulse (!) 121, temperature 98.9 F (37.2 C), temperature source Axillary, resp. rate 18, height 5\' 3"  (1.6 m), weight 106.5 kg, SpO2 93 %. GEN: Elderly, chronically ill-appearing, NAD, interactive ENT: NCAT EYES: EOMI CV: Tacky, irregular, normal S1 and S2 PULM: Diminished in the bases bilaterally ABD: Soft, nontender, bowel sounds present SKIN: Buttock wound not evaluated EXT: No significant edema NEURO: Awake, alert, interactive, answering questions  A AOCKD3a, nonoliguric BL SCr 1.0 to 1.5 Likely ATN from  sepsis/infection Has foley, unless sig worsening will not pursue renal imaging; doubt obstruction UA w/o features suggestive of GN Septic Shock on PE, 2/2 R buttock cellulitis: per CCM AFib with RVR on Amio Gtt CAD hx/o PCI Chronic HFrEF Mild hyponatremia, monitor  P Cont supportive care No RRT Needs Appears to be stabilizing / recovering GFR  Daily weights, Daily Renal Panel, Strict I/Os, Avoid nephrotoxins (NSAIDs, judicious IV Contrast)   Pearson Grippe MD 10/04/2021, 9:18 AM  Recent Labs  Lab 10/02/21 1844 10/03/21 0955 10/04/21 0735  NA 130* 130* 136  K 4.1 3.9 3.5  CL 95* 96* 103  CO2 20* 19* 21*  GLUCOSE 295* 373* 225*  BUN 69* 83* 63*  CREATININE 2.23* 2.79* 1.46*  CALCIUM 8.3* 7.9* 8.4*  PHOS  --   --  2.3*    Recent Labs  Lab 10/01/21 1945 10/02/21 0420 10/03/21 0955 10/04/21 0735  WBC 17.9* 17.4* 12.4* 15.6*  NEUTROABS 14.3*  --   --   --   HGB 11.6* 11.7* 11.5* 11.1*  HCT 35.1* 37.2 34.5* 33.9*  MCV 98.0 104.5* 95.6 96.9  PLT 296 238 330 379

## 2021-10-04 NOTE — Progress Notes (Signed)
ANTICOAGULATION CONSULT NOTE  Pharmacy Consult for heparin Indication: atrial fibrillation  No Known Allergies  Patient Measurements: Height: 5\' 3"  (160 cm) Weight: 106.5 kg (234 lb 12.6 oz) IBW/kg (Calculated) : 52.4 HEPARIN DW (KG): 77.8   Vital Signs: Temp: 97.8 F (36.6 C) (10/01 1937) Temp Source: Axillary (10/01 1937) BP: 92/63 (10/01 1915) Pulse Rate: 104 (10/01 1915)  Labs: Recent Labs    10/02/21 0420 10/02/21 0540 10/02/21 1844 10/03/21 0955 10/03/21 1140 10/04/21 0735 10/04/21 1416 10/04/21 1624 10/04/21 2021  HGB 11.7*  --   --  11.5*  --  11.1*  --   --   --   HCT 37.2  --   --  34.5*  --  33.9*  --   --   --   PLT 238  --   --  330  --  379  --   --   --   HEPARINUNFRC  --   --   --   --   --   --   --   --  0.28*  CREATININE  --    < > 2.23* 2.79*  --  1.46*  --   --   --   TROPONINIHS  --   --   --  55*   < > 151* 110* 93*  --    < > = values in this interval not displayed.     Estimated Creatinine Clearance: 41.9 mL/min (A) (by C-G formula based on SCr of 1.46 mg/dL (H)).   Assessment: 71 yo female with spetic shock due to cellulitis. She is noted wth new onset afib (CHADSVASC= 6). Pharmacy consulted to dose heparin. No anticoagulation noted PTA.  -Hg- 11.1, plt= 136   Heparin level slightly subtherapeutic (0.28) on infusion at 1100 units/hr. No issues with line or bleeding reported per RN.   Goal of Therapy:  Heparin level 0.3-0.7 units/ml Monitor platelets by anticoagulation protocol: Yes   Plan:  Increase heparin to 1250 units/hr Will f/u 8hr heparin level  Sherlon Handing, PharmD, BCPS Please see amion for complete clinical pharmacist phone list 10/04/2021 9:33 PM

## 2021-10-05 ENCOUNTER — Other Ambulatory Visit (HOSPITAL_COMMUNITY): Payer: Self-pay

## 2021-10-05 ENCOUNTER — Telehealth (HOSPITAL_COMMUNITY): Payer: Self-pay

## 2021-10-05 DIAGNOSIS — R6520 Severe sepsis without septic shock: Secondary | ICD-10-CM | POA: Diagnosis not present

## 2021-10-05 DIAGNOSIS — A419 Sepsis, unspecified organism: Secondary | ICD-10-CM | POA: Diagnosis not present

## 2021-10-05 LAB — GLUCOSE, CAPILLARY
Glucose-Capillary: 121 mg/dL — ABNORMAL HIGH (ref 70–99)
Glucose-Capillary: 131 mg/dL — ABNORMAL HIGH (ref 70–99)
Glucose-Capillary: 132 mg/dL — ABNORMAL HIGH (ref 70–99)
Glucose-Capillary: 156 mg/dL — ABNORMAL HIGH (ref 70–99)
Glucose-Capillary: 200 mg/dL — ABNORMAL HIGH (ref 70–99)
Glucose-Capillary: 215 mg/dL — ABNORMAL HIGH (ref 70–99)
Glucose-Capillary: 239 mg/dL — ABNORMAL HIGH (ref 70–99)
Glucose-Capillary: 290 mg/dL — ABNORMAL HIGH (ref 70–99)
Glucose-Capillary: 74 mg/dL (ref 70–99)

## 2021-10-05 LAB — RENAL FUNCTION PANEL
Albumin: 1.5 g/dL — ABNORMAL LOW (ref 3.5–5.0)
Anion gap: 10 (ref 5–15)
BUN: 40 mg/dL — ABNORMAL HIGH (ref 8–23)
CO2: 23 mmol/L (ref 22–32)
Calcium: 8.3 mg/dL — ABNORMAL LOW (ref 8.9–10.3)
Chloride: 106 mmol/L (ref 98–111)
Creatinine, Ser: 1.07 mg/dL — ABNORMAL HIGH (ref 0.44–1.00)
GFR, Estimated: 56 mL/min — ABNORMAL LOW (ref >60)
Glucose, Bld: 144 mg/dL — ABNORMAL HIGH (ref 70–99)
Phosphorus: 2.1 mg/dL — ABNORMAL LOW (ref 2.5–4.6)
Potassium: 3.3 mmol/L — ABNORMAL LOW (ref 3.5–5.1)
Sodium: 139 mmol/L (ref 135–145)

## 2021-10-05 LAB — CBC
HCT: 30.2 % — ABNORMAL LOW (ref 36.0–46.0)
Hemoglobin: 9.8 g/dL — ABNORMAL LOW (ref 12.0–15.0)
MCH: 31.9 pg (ref 26.0–34.0)
MCHC: 32.5 g/dL (ref 30.0–36.0)
MCV: 98.4 fL (ref 80.0–100.0)
Platelets: 343 10*3/uL (ref 150–400)
RBC: 3.07 MIL/uL — ABNORMAL LOW (ref 3.87–5.11)
RDW: 13.6 % (ref 11.5–15.5)
WBC: 13.5 10*3/uL — ABNORMAL HIGH (ref 4.0–10.5)
nRBC: 0.2 % (ref 0.0–0.2)

## 2021-10-05 LAB — HEPARIN LEVEL (UNFRACTIONATED): Heparin Unfractionated: 0.29 IU/mL — ABNORMAL LOW (ref 0.30–0.70)

## 2021-10-05 MED ORDER — CLOPIDOGREL BISULFATE 300 MG PO TABS
300.0000 mg | ORAL_TABLET | Freq: Once | ORAL | Status: AC
  Administered 2021-10-05: 300 mg via ORAL
  Filled 2021-10-05: qty 1

## 2021-10-05 MED ORDER — SODIUM CHLORIDE 0.9 % IV BOLUS
500.0000 mL | Freq: Once | INTRAVENOUS | Status: AC
  Administered 2021-10-05: 500 mL via INTRAVENOUS

## 2021-10-05 MED ORDER — APIXABAN 5 MG PO TABS
5.0000 mg | ORAL_TABLET | Freq: Two times a day (BID) | ORAL | Status: DC
  Administered 2021-10-05 – 2021-10-10 (×12): 5 mg via ORAL
  Filled 2021-10-05 (×12): qty 1

## 2021-10-05 MED ORDER — CLOPIDOGREL BISULFATE 75 MG PO TABS
75.0000 mg | ORAL_TABLET | Freq: Every day | ORAL | Status: DC
  Administered 2021-10-06 – 2021-10-10 (×5): 75 mg via ORAL
  Filled 2021-10-05 (×5): qty 1

## 2021-10-05 MED ORDER — SODIUM CHLORIDE 0.9 % IV SOLN
2.0000 g | INTRAVENOUS | Status: DC
  Administered 2021-10-05 – 2021-10-10 (×6): 2 g via INTRAVENOUS
  Filled 2021-10-05 (×5): qty 20

## 2021-10-05 NOTE — Progress Notes (Signed)
Physical Therapy Treatment Patient Details Name: Mallory Butler MRN: [id number] DOB: 03/10/1950 Today's Date: 10/05/2021   History of Present Illness 71 yo female admitted 9/28 with Rt buttock cellulitis. Afib and hypotension prompted transfer to ICU 9/30. PMhx: CHF, DM, CAD, HTN and HLD    PT Comments    Pt reports Rt buttock pain limiting mobility and no significant assist of family/friends at home with pt with significantly limited mobility due to pain. Pt able to progress gait to in room this session and still agreeable to SNF at D/C. Will continue to follow.   HR 65 BP 95/60 (72) SpO2 98% on RA    Recommendations for follow up therapy are one component of a multi-disciplinary discharge planning process, led by the attending physician.  Recommendations may be updated based on patient status, additional functional criteria and insurance authorization.  Follow Up Recommendations  Skilled nursing-short term rehab (<3 hours/day) Can patient physically be transported by private vehicle: Yes   Assistance Recommended at Discharge Intermittent Supervision/Assistance  Patient can return home with the following A little help with walking and/or transfers;A little help with bathing/dressing/bathroom;Assistance with cooking/housework;Assist for transportation   Equipment Recommendations  BSC/3in1    Recommendations for Other Services       Precautions / Restrictions Precautions Precautions: Fall     Mobility  Bed Mobility Overal bed mobility: Needs Assistance Bed Mobility: Supine to Sit     Supine to sit: Min guard, HOB elevated     General bed mobility comments: 30 degrees with increased time and cues for hand placement    Transfers Overall transfer level: Needs assistance   Transfers: Sit to/from Stand Sit to Stand: Min assist           General transfer comment: min assist to rise from bed with RW stabilized and cues for hand placement as well as to rise from  recliner    Ambulation/Gait Ambulation/Gait assistance: Min guard, +2 safety/equipment Gait Distance (Feet): 20 Feet Assistive device: Rolling walker (2 wheels) Gait Pattern/deviations: Step-through pattern, Decreased stride length, Trunk flexed       General Gait Details: pt with decreased stride, flexed trunk and limited tolerance for gait due to Rt buttock pain. chair follow   Stairs             Wheelchair Mobility    Modified Rankin (Stroke Patients Only)       Balance Overall balance assessment: Needs assistance Sitting-balance support: No upper extremity supported, Feet supported Sitting balance-Leahy Scale: Fair     Standing balance support: Bilateral upper extremity supported, During functional activity Standing balance-Leahy Scale: Poor Standing balance comment: RW for standing                            Cognition Arousal/Alertness: Awake/alert Behavior During Therapy: WFL for tasks assessed/performed Overall Cognitive Status: Within Functional Limits for tasks assessed                                          Exercises      General Comments        Pertinent Vitals/Pain Pain Assessment Pain Assessment: 0-10 Pain Score: 7  Pain Location: Rt buttock Pain Descriptors / Indicators: Aching, Guarding Pain Intervention(s): Limited activity within patient's tolerance, Monitored during session, Repositioned, Patient requesting pain meds-RN notified    Home Living Family/patient  expects to be discharged to:: Private residence Living Arrangements: Alone Available Help at Discharge: Family;Available PRN/intermittently;Neighbor Type of Home: Mobile home Home Access: Stairs to enter   [employer] of Steps: 7   Home Layout: One level Home Equipment: Shower seat;Cane - single point;Electric scooter Additional Comments: battery dead on scooter    Prior Function            PT Goals (current goals can now be  found in the care plan section) Progress towards PT goals: Progressing toward goals    Frequency    Min 3X/week      PT Plan Current plan remains appropriate    Co-evaluation              AM-PAC PT "6 Clicks" Mobility   Outcome Measure  Help needed turning from your back to your side while in a flat bed without using bedrails?: A Little Help needed moving from lying on your back to sitting on the side of a flat bed without using bedrails?: A Little Help needed moving to and from a bed to a chair (including a wheelchair)?: A Little Help needed standing up from a chair using your arms (e.g., wheelchair or bedside chair)?: A Little Help needed to walk in hospital room?: A Lot Help needed climbing 3-5 steps with a railing? : Total 6 Click Score: 15    End of Session   Activity Tolerance: Patient tolerated treatment well Patient left: in chair;with call bell/phone within reach;with nursing/sitter in room;with chair alarm set Nurse Communication: Mobility status PT Visit Diagnosis: Unsteadiness on feet (R26.81);Other abnormalities of gait and mobility (R26.89);Muscle weakness (generalized) (M62.81);Pain     Time: 0722-0749 PT Time Calculation (min) (ACUTE ONLY): 27 min  Charges:  $Gait Training: 8-22 mins $Therapeutic Activity: 8-22 mins                     Bayard Males, PT Acute Rehabilitation Services Office: [place] 10/05/2021, 7:57 AM

## 2021-10-05 NOTE — Consult Note (Signed)
CARDIOLOGY CONSULT NOTE  Patient ID: Mallory Butler MRN: [id number] DOB/AGE: 07/28/1950 70 y.o.  Admit date: 10/01/2021 Referring Physician: PCCM Reason for Consultation:  Afib  HPI:   70 y.o. Caucasian female  with hypertension, hyperlipidemia, type 2 DM, CAD s/p NSTEMI (01/2021) in the setting of COVID-19, treated iwht LAD PCI, residual disease, now with paroxysmal Afib w/RVR in the setting of septic shock due to Rt buttock cellulitis  Patient currently denies any chest pain or palpitations.  She converted to sinus rhythm this morning.  She has been compliant with aspirin and Brilinta without any bleeding issues.  Septic shock from cellulitis is improving.  Past Medical History:  Diagnosis Date   CHF (congestive heart failure) (HCC)    Coronary artery disease    COVID-19    Diabetes mellitus without complication (HCC)    Hyperlipidemia    NSTEMI (non-ST elevated myocardial infarction) (HCC)    Thyroid disease      Past Surgical History:  Procedure Laterality Date   CORONARY STENT INTERVENTION N/A 02/02/2021   Procedure: CORONARY STENT INTERVENTION;  Surgeon: Ganji, Jay, MD;  Location: [facility];  Service: Cardiovascular;  Laterality: N/A;   KNEE SURGERY Left    RIGHT/LEFT HEART CATH AND CORONARY ANGIOGRAPHY N/A 02/02/2021   Procedure: RIGHT/LEFT HEART CATH AND CORONARY ANGIOGRAPHY;  Surgeon: Ganji, Jay, MD;  Location: [facility];  Service: Cardiovascular;  Laterality: N/A;      Family History  Problem Relation Age of Onset   Hyperlipidemia Mother    Cancer Mother    Heart attack Father    Hypertension Father    Heart attack Sister      Social History: Social History   Socioeconomic History   Marital status: Single    Spouse name: Not on file   Number of children: Not on file   Years of education: Not on file   Highest education level: Not on file  Occupational History   Not on file  Tobacco Use   Smoking status: Former    Types: Cigarettes     Quit date: 1972    Years since quitting: 51.7   Smokeless tobacco: Never  Vaping Use   Vaping Use: Never used  Substance and Sexual Activity   Alcohol use: Not Currently    Comment: occ   Drug use: Not on file   Sexual activity: Not on file  Other Topics Concern   Not on file  Social History Narrative   Not on file   Social Determinants of Health   Financial Resource Strain: Not on file  Food Insecurity: Not on file  Transportation Needs: Not on file  Physical Activity: Not on file  Stress: Not on file  Social Connections: Not on file  Intimate Partner Violence: Not on file     Medications Prior to Admission  Medication Sig Dispense Refill Last Dose   aspirin EC 81 MG tablet Take 81 mg by mouth daily.   09/28/2021   atorvastatin (LIPITOR) 40 MG tablet TAKE 1 TABLET BY MOUTH EVERYDAY AT BEDTIME (Patient taking differently: Take 40 mg by mouth daily.) 90 tablet 0 09/28/2021   bisoprolol (ZEBETA) 10 MG tablet Take 10 mg by mouth daily.   09/28/2021 at 1000   BRILINTA 90 MG TABS tablet TAKE 1 TABLET BY MOUTH 2 TIMES DAILY. 60 tablet 2 09/28/2021 at pm   glipiZIDE (GLUCOTROL) 10 MG tablet Take 10 mg by mouth 2 (two) times daily before a meal.   09/28/2021     HUMALOG MIX 75/25 KWIKPEN (75-25) 100 UNIT/ML KwikPen Inject 35 Units into the skin 2 (two) times daily.   09/28/2021   levothyroxine (SYNTHROID) 88 MCG tablet Take 88 mcg by mouth daily before breakfast.   09/28/2021   metFORMIN (GLUCOPHAGE) 1000 MG tablet Take 1 tablet by mouth 2 (two) times a day.   09/28/2021   Multiple Vitamin (MULTIVITAMIN) capsule Take 1 capsule by mouth daily.   UNKNOWN   oxybutynin (DITROPAN XL) 15 MG 24 hr tablet Take 15 mg by mouth daily.   09/28/2021   prednisoLONE acetate (PRED FORTE) 1 % ophthalmic suspension Place 1 drop into the right eye daily.   09/28/2021   sacubitril-valsartan (ENTRESTO) 97-103 MG Take 1 tablet by mouth 2 (two) times daily. 180 tablet 0 09/28/2021   spironolactone (ALDACTONE) 25 MG  tablet TAKE 1/2 TABLET BY MOUTH EVERY DAY 45 tablet 1 09/28/2021   venlafaxine XR (EFFEXOR-XR) 150 MG 24 hr capsule Take 150 mg by mouth daily with breakfast.   09/28/2021   ezetimibe (ZETIA) 10 MG tablet TAKE 1 TABLET BY MOUTH EVERY DAY (Patient taking differently: Take 10 mg by mouth daily.) 90 tablet 0 UNKNOWN   glucose blood (ONETOUCH ULTRA) test strip USE TO TEST 3 TIMES DAILY       Review of Systems  Cardiovascular:  Negative for chest pain, dyspnea on exertion, leg swelling, palpitations and syncope.      Physical Exam: Physical Exam Vitals and nursing note reviewed.  Constitutional:      General: She is not in acute distress. Neck:     Vascular: No JVD.  Cardiovascular:     Rate and Rhythm: Normal rate and regular rhythm.     Heart sounds: Normal heart sounds. No murmur heard. Pulmonary:     Effort: Pulmonary effort is normal.     Breath sounds: Normal breath sounds. No wheezing or rales.  Musculoskeletal:     Right lower leg: No edema.     Left lower leg: No edema.        Imaging/tests reviewed and independently interpreted: Lab Results: CBC, BMP, trop HS  Cardiac Studies:  Telemetry 10/05/2021: A-fib RVR, converted to sinus rhythm on 10/05/2021 morning  EKG 10/03/2021: A-fib with RVR Left axis deviation LVH Anteroseptal infarct age-indeterminate  Echocardiogram 10/03/2021:  1. Left ventricular ejection fraction, by estimation, is 60 to 65%. The  left ventricle has normal function. The left ventricle demonstrates  regional wall motion abnormalities (see scoring diagram/findings for  description). There is moderate concentric  left ventricular hypertrophy. Left ventricular diastolic parameters are  indeterminate.   2. Right ventricular systolic function is normal. The right ventricular  size is normal. There is normal pulmonary artery systolic pressure. The  estimated right ventricular systolic pressure is 19.6 mmHg.   3. Left atrial size was moderately  dilated.   4. The mitral valve is abnormal. Trivial mitral valve regurgitation.   5. The aortic valve is tricuspid. Aortic valve regurgitation is not  visualized. Aortic valve sclerosis/calcification is present, without any  evidence of aortic stenosis.   6. The inferior vena cava is normal in size with greater than 50%  respiratory variability, suggesting right atrial pressure of 3 mmHg.   Comparison(s): Prior images reviewed side by side. Overall LVEF has  normalized in the range of 60-65%. Also relative improvement in wall  motion abnormalities.     Mid Cx lesion is 90% stenosed.   Ost Cx to Prox Cx lesion is 30% stenosed.   1st Diag   lesion is 99% stenosed.   Prox LAD to Mid LAD lesion is 100% stenosed.   A drug-eluting stent was successfully placed using a STENT ONYX FRONTIER 2.5X26.   A drug-eluting stent was successfully placed using a STENT ONYX FRONTIER 2.75X12.   Post intervention, there is a 0% residual stenosis.   Right and left Heart Catheterization, coronary intervention 02/02/2021:   RA: 16/14, mean 14 mmHg RV 52/15, EDP 17 mmHg PA 50/22, mean 37 mmHg. PW 27/29, mean 24 mmHg. QP/QS 1.00.  CO 4.2, CI 2.02 by Fick.   LV: 112/15, EDP 29 mmHg.  Ao: 97/60, mean 76 mmHg.  There was no pressure gradient across the aortic valve. LM: Mildly calcified but widely patent. LAD: Large vessel in the proximal segment, moderately calcified, occluded in the midsegment after the origin of a large diagonal 1 which has ostial 99% stenosis. CX: Large vessel, proximal segment has a 30% stenosis, large OM1 with a proximal 20 to 30% stenosis, AV groove has 90 to 95% stenosis in the proximal to mid segment. RCA: Large vessel, mild luminal irregularity.   Interventional data: Successful PTCA and stenting of the mid LAD with implantation of a 2.5 x 26 mm Onyx frontier DES, proximally into the proximal LAD and into the distal LM 82.75 x 12 mm Onyx frontier stent deployed postdilated with 3.5 x 8 mm  [place] balloon at 20 atm pressure for 30 seconds x 2.  Complete expansion of the stent into the left main.  No sidebranch jailing of D1 which has ostial 99% stenosis.  TIMI 0 to TIMI-3 flow at the end of the procedure.  110 mL contrast utilized.  Recommendation: Continue aspirin indefinitely and Brilinta for at least a period of 1 year.  Medical therapy for circumflex disease unless she has recurrence of angina pectoris.  Medical therapy for D1 disease.    Assessment & Recommendations:  70 y.o. Caucasian female  with hypertension, hyperlipidemia, type 2 DM, CAD s/p NSTEMI (01/2021) in the setting of COVID-19, treated iwht LAD PCI, residual disease, now with paroxysmal Afib w/RVR in the setting of septic shock due to Rt buttock cellulitis  Paroxysmal A-fib: Episodes of A-fib with RVR, converted to sinus rhythm this morning. Likely triggered by underlying cellulitis and sepsis. Rate control therapy reasonable at this time.  If she has recurrence of A-fib, especially outpatient, will consider rhythm control therapy with Multaq. CHA2DS2VASc score 4, annual stroke risk 4.8% Recommend Eliquis 5 mg twice daily. Given initiation of Eliquis, will modify antiplatelet therapy as follows: Change aspirin and Brilinta to Plavix alone. Continue Plavix at least  02/2022.  We will consider discontinuation of Plavix after that.  CAD: Underlying CAD with PCI to LAD in setting of non-STEMI 01/2021. No significant chest pain during A-fib RVR episodes.  Mild troponin elevation secondary to A-fib RVR, type II MI.  Will cancel outpatient echocardiogram scheduled for 10/08/2021.  Keep appointment with Dr. Tolia on 10/15/2021, unless patient still hospitalized.   Discussed interpretation of tests and management recommendations with the primary team     Manish J Patwardhan, MD Pager: [phone] Office: [phone]  

## 2021-10-05 NOTE — Progress Notes (Signed)
Cancellation Note    10/05/21 1000  OT Visit Information  Last OT Received On 10/05/21  Reason Eval/Treat Not Completed Other (comment);Patient at procedure or test/ unavailable (with IV nurse. Will return later itme)   Maurie Boettcher, OT/L   Acute OT Clinical Specialist [place] Pager [phone] Office [phone]

## 2021-10-05 NOTE — Telephone Encounter (Signed)
Pharmacy Patient Advocate Encounter  Insurance verification completed.    The patient is insured through [employer] Part D   The patient is currently admitted and ran test claims for the following: Xarelto 20mg and Eliquis 5mg.  Copays and coinsurance results were relayed to Inpatient clinical team.  

## 2021-10-05 NOTE — Progress Notes (Signed)
NAME:  Mallory Butler, MRN:  [id number], DOB:  27-Oct-1950, LOS: 4 ADMISSION DATE:  10/01/2021, CONSULTATION DATE:  10/03/21 REFERRING MD:  Wynelle Cleveland, CHIEF COMPLAINT:  shock   History of Present Illness:  Mallory Butler is 24 with a history of obesity, CAD/PCI, diabetes on insulin, hypertension, hyperlipidemia, combined systolic and diastolic CHF.  She was admitted 9/28 with progressive skin infection of the right buttock, first noticed about 5 days prior, enlarging.  Also with some diarrhea, no other focal symptoms.  CT pelvis confirms cellulitis, edema in the gluteus maximus concerning for possible infectious myositis, no overt abscess.  She was admitted and started on broad-spectrum antibiotics, pain control.  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  She has been significantly hyperglycemic, insulin uptitrated. Course today 7/62 complicated by atrial fibrillation with periods of RVR, associated hypotension.  Her blood pressure had initially been stable on a dose of metoprolol was given.  Hypotensive since then.  She is also received narcotics for pain control right buttock. PCCM consulted to evaluate for shock.  On initial evaluation heart rate 100-120, SBP 70s-80s.    Pertinent  Medical History   Past Medical History:  Diagnosis Date   CHF (congestive heart failure) ([place])    Coronary artery disease    COVID-19    Diabetes mellitus without complication ([place])    Hyperlipidemia    NSTEMI (non-ST elevated myocardial infarction) ([place])    Thyroid disease      Significant Hospital Events: Including procedures, antibiotic start and stop dates in addition to other pertinent events   9/28 admit to [facility] R buttock infection  9/30 PCCM consult, transfer to ICU for septic shock req peripheral neo   Interim History / Subjective:   Sitting in chair, eating breakfast, no acute distress  Objective   Blood pressure 95/60, pulse 65,  temperature 98 F (36.7 C), temperature source Oral, resp. rate 16, height 5\' 3"  (1.6 m), weight 107 kg, SpO2 99 %.        Intake/Output Summary (Last 24 hours) at 10/05/2021 8315 Last data filed at 10/05/2021 0700 Gross per 24 hour  Intake 3633.29 ml  Output 2375 ml  Net 1258.29 ml   Filed Weights   10/03/21 0500 10/04/21 0500 10/05/21 0500  Weight: 104.4 kg 106.5 kg 107 kg    Examination: General: Elderly female no acute distress sitting up in chair HENT: NCAT, tracking appropriately Lungs: Clear to auscultation bilaterally diminished in the bases Cardiovascular: Regular rate rhythm, S1-S2 Abdomen: Soft nontender nondistended, obese Extremities: No significant edema Neuro: Oriented following commands GU: Urine in Foley  Resolved Hospital Problem list   Lactic acidosis   Assessment & Plan:    Septic shock due to R buttock cellulitis P: De-escalate cefepime to ceftriaxone Stop vancomycin Continue midodrine  New onset Afib  -r/t sepsis, critical illness  P: Stop amiodarone infusion Continue heparin drip, consult cardiology to discuss consideration to NOAC Continue ticagrelor due to her history of NSTEMI Discussed with Dr. Virgina Jock via phone.  DM2 with hyperglycemia  Plan: Continue Semglee, SSI Goal CBG 140-180  AKI on CKD IIIb  AGMA  Plan: Follow BMP and urine output  CAD  Hx HFrEF with recovery of EF  Plan: Continue Zetia Continue ticagrelor  Hyponatremia P A.m. labs are still pending due to difficulties with blood draws  Hypothyroidism -Continue levothyroxine  Depression  -cont effexor XL  IV access issues Plan: Consult IV team for  midline placement vs US guided IV placement    Best Practice (right click and "Reselect all SmartList Selections" daily)   Diet/type: Regular consistency (see orders) DVT prophylaxis: prophylactic heparin  -- ordering for hep gtt 10/1 GI prophylaxis: PPI Lines: N/A Foley:  N/A Code Status:  full  code Last date of multidisciplinary goals of care discussion [pending]  Labs   CBC: Recent Labs  Lab 10/01/21 1945 10/02/21 0420 10/03/21 0955 10/04/21 0735  WBC 17.9* 17.4* 12.4* 15.6*  NEUTROABS 14.3*  --   --   --   HGB 11.6* 11.7* 11.5* 11.1*  HCT 35.1* 37.2 34.5* 33.9*  MCV 98.0 104.5* 95.6 96.9  PLT 296 238 330 [id number]    Basic Metabolic Panel: Recent Labs  Lab 10/01/21 1945 10/02/21 0540 10/02/21 1844 10/03/21 0955 10/03/21 1140 10/04/21 0735  NA 131* 132* 130* 130*  --  136  K 4.5 4.2 4.1 3.9  --  3.5  CL 91* 96* 95* 96*  --  103  CO2 21* 21* 20* 19*  --  21*  GLUCOSE 465* 390* 295* 373*  --  225*  BUN 58* 62* 69* 83*  --  63*  CREATININE 2.02* 1.95* 2.23* 2.79*  --  1.46*  CALCIUM 8.8* 8.1* 8.3* 7.9*  --  8.4*  MG  --   --   --   --  2.1 2.0  PHOS  --   --   --   --   --  2.3*   GFR: Estimated Creatinine Clearance: 42 mL/min (A) (by C-G formula based on SCr of 1.46 mg/dL (H)). Recent Labs  Lab 10/01/21 1945 10/01/21 2211 10/02/21 0420 10/02/21 0540 10/02/21 1844 10/03/21 0955 10/03/21 1140 10/03/21 1450 10/04/21 0735  PROCALCITON  --   --   --  12.27 12.82  --   --   --   --   WBC 17.9*  --  17.4*  --   --  12.4*  --   --  15.6*  LATICACIDVEN  --    < >  --   --  2.2* 2.3* 2.6* 1.6  --    < > = values in this interval not displayed.    Liver Function Tests: Recent Labs  Lab 10/01/21 1945 10/04/21 0735  AST 30  --   ALT 19  --   ALKPHOS 98  --   BILITOT 0.7  --   PROT 7.2  --   ALBUMIN 2.4* 1.5*   No results for input(s): "LIPASE", "AMYLASE" in the last 168 hours. No results for input(s): "AMMONIA" in the last 168 hours.  ABG    Component Value Date/Time   PHART 7.310 (L) 02/02/2021 0951   PCO2ART 39.5 02/02/2021 0951   PO2ART 128 (H) 02/02/2021 0951   HCO3 19.9 (L) 02/02/2021 0951   TCO2 21 (L) 02/02/2021 0951   ACIDBASEDEF 6.0 (H) 02/02/2021 0951   O2SAT 99.0 02/02/2021 0951     Coagulation Profile: Recent Labs  Lab  10/01/21 1945  INR 1.3*    Cardiac Enzymes: No results for input(s): "CKTOTAL", "CKMB", "CKMBINDEX", "TROPONINI" in the last 168 hours.  HbA1C: Hgb A1c MFr Bld  Date/Time Value Ref Range Status  10/02/2021 04:20 AM 10.4 (H) 4.8 - 5.6 % Final    Comment:    (NOTE) Pre diabetes:          5.7%-6.4%  Diabetes:              >6.4%  Glycemic control for   <  7.0% adults with diabetes   02/01/2021 07:59 PM 12.0 (H) 4.8 - 5.6 % Final    Comment:    (NOTE) Pre diabetes:          5.7%-6.4%  Diabetes:              >6.4%  Glycemic control for   <7.0% adults with diabetes     CBG: Recent Labs  Lab 10/04/21 1618 10/04/21 1929 10/05/21 0030 10/05/21 0346 10/05/21 0746  GLUCAP 199* 282* 290* 131* 74     Garner Nash, DO [facility] 10/05/2021 8:24 AM

## 2021-10-05 NOTE — TOC Benefit Eligibility Note (Signed)
Patient Teacher, English as a foreign language completed.    The patient is currently admitted and upon discharge could be taking Multaq 400 mg.  The current 30 day co-pay is $203.18.   The patient is currently admitted and upon discharge could be taking dofetilide (Tikosyn) 500 mcg.  The current 30 day co-pay is $12.87.    The patient is insured through [employer], [place] Patient Advocate Specialist [place] Patient Advocate Team Direct Number: [phone]  Fax: [phone]

## 2021-10-05 NOTE — Progress Notes (Signed)
Subjective/Chief Complaint: Awake, alert right buttock sore   Objective: Vital signs in last 24 hours: Temp:  [97.5 F (36.4 C)-98.7 F (37.1 C)] 98 F (36.7 C) (10/02 0800) Pulse Rate:  [63-134] 64 (10/02 0921) Resp:  [14-30] 17 (10/02 0921) BP: (68-126)/(25-103) 103/54 (10/02 0921) SpO2:  [87 %-100 %] 100 % (10/02 0921) Weight:  OH:5761380 kg] 107 kg (10/02 0500) Last BM Date : 10/01/21  Intake/Output from previous day: 10/01 0701 - 10/02 0700 In: 3633.3 [I.V.:2238.8; IV Piggyback:1394.5] Out: 2375 [Urine:2375] Intake/Output this shift: Total I/O In: 70 [I.V.:70] Out: -   Right buttock with small eschar and some mild erythema, it is tender but there is nothing fluctuant or that feels drainable  Lab Results:  Recent Labs    10/04/21 0735 10/05/21 0956  WBC 15.6* 13.5*  HGB 11.1* 9.8*  HCT 33.9* 30.2*  PLT 379 343   BMET Recent Labs    10/04/21 0735 10/05/21 0956  NA 136 139  K 3.5 3.3*  CL 103 106  CO2 21* 23  GLUCOSE 225* 144*  BUN 63* 40*  CREATININE 1.46* 1.07*  CALCIUM 8.4* 8.3*   PT/INR No results for input(s): "LABPROT", "INR" in the last 72 hours. ABG No results for input(s): "PHART", "HCO3" in the last 72 hours.  Invalid input(s): "PCO2", "PO2"  Studies/Results: ECHOCARDIOGRAM COMPLETE  Result Date: 10/03/2021    ECHOCARDIOGRAM REPORT   Patient Name:   Mallory Butler Date of Exam: 10/03/2021 Medical Rec #:  [id number]     Height:       63.0 in Accession #:    [id number]    Weight:       230.2 lb Date of Birth:  July 08, 1950     BSA:          2.053 m Patient Age:    71 years      BP:           98/60 mmHg Patient Gender: F             HR:           112 bpm. Exam Location:  Inpatient Procedure: 2D Echo, Cardiac Doppler and Color Doppler Indications:    Congestive heart failure  History:        Patient has prior history of Echocardiogram examinations, most                 recent 01/29/2021. CHF, CAD; Risk Factors:Diabetes and                 Dyslipidemia.   Sonographer:    Jefferey Pica Referring Phys: [place]  1. Left ventricular ejection fraction, by estimation, is 60 to 65%. The left ventricle has normal function. The left ventricle demonstrates regional wall motion abnormalities (see scoring diagram/findings for description). There is moderate concentric left ventricular hypertrophy. Left ventricular diastolic parameters are indeterminate.  2. Right ventricular systolic function is normal. The right ventricular size is normal. There is normal pulmonary artery systolic pressure. The estimated right ventricular systolic pressure is [id number] mmHg.  3. Left atrial size was moderately dilated.  4. The mitral valve is abnormal. Trivial mitral valve regurgitation.  5. The aortic valve is tricuspid. Aortic valve regurgitation is not visualized. Aortic valve sclerosis/calcification is present, without any evidence of aortic stenosis.  6. The inferior vena cava is normal in size with greater than 50% respiratory variability, suggesting right atrial pressure of 3 mmHg. Comparison(s): Prior images reviewed side by side.  Overall LVEF has normalized in the range of 60-65%. Also relative improvement in wall motion abnormalities. FINDINGS  Left Ventricle: Left ventricular ejection fraction, by estimation, is 60 to 65%. The left ventricle has normal function. The left ventricle demonstrates regional wall motion abnormalities. The left ventricular internal cavity size was normal in size. There is moderate concentric left ventricular hypertrophy. Left ventricular diastolic parameters are indeterminate.  LV Wall Scoring: The mid and distal anterior septum and apex are hypokinetic. The entire anterior wall, entire lateral wall, entire inferior wall, basal anteroseptal segment, mid inferoseptal segment, and basal inferoseptal segment are normal. Right Ventricle: The right ventricular size is normal. No increase in right ventricular wall thickness. Right ventricular  systolic function is normal. There is normal pulmonary artery systolic pressure. The tricuspid regurgitant velocity is 2.04 m/s, and  with an assumed right atrial pressure of 3 mmHg, the estimated right ventricular systolic pressure is [id number] mmHg. Left Atrium: Left atrial size was moderately dilated. Right Atrium: Right atrial size was normal in size. Pericardium: There is no evidence of pericardial effusion. Presence of epicardial fat layer. Mitral Valve: The mitral valve is abnormal. There is mild thickening of the mitral valve leaflet(s). There is mild calcification of the mitral valve leaflet(s). Mild mitral annular calcification. Trivial mitral valve regurgitation. Tricuspid Valve: The tricuspid valve is grossly normal. Tricuspid valve regurgitation is mild. Aortic Valve: The aortic valve is tricuspid. There is mild aortic valve annular calcification. Aortic valve regurgitation is not visualized. Aortic valve sclerosis/calcification is present, without any evidence of aortic stenosis. Aortic valve peak gradient measures 8.3 mmHg. Pulmonic Valve: The pulmonic valve was grossly normal. Pulmonic valve regurgitation is trivial. Aorta: The aortic root is normal in size and structure. Venous: The inferior vena cava is normal in size with greater than 50% respiratory variability, suggesting right atrial pressure of 3 mmHg. IAS/Shunts: No atrial level shunt detected by color flow Doppler.  LEFT VENTRICLE PLAX 2D LVIDd:         4.50 cm LVIDs:         2.60 cm LV PW:         1.30 cm LV IVS:        1.30 cm LVOT diam:     2.10 cm LV SV:         70 LV SV Index:   34 LVOT Area:     3.46 cm  RIGHT VENTRICLE             IVC RV Basal diam:  2.60 cm     IVC diam: 1.70 cm RV S prime:     11.50 cm/s TAPSE (M-mode): 2.2 cm LEFT ATRIUM             Index        RIGHT ATRIUM           Index LA diam:        4.40 cm 2.14 cm/m   RA Area:     13.20 cm LA Vol (A2C):   76.1 ml 37.08 ml/m  RA Volume:   28.15 ml  13.71 ml/m LA Vol (A4C):    86.5 ml 42.14 ml/m LA Biplane Vol: 82.9 ml 40.39 ml/m  AORTIC VALVE                 PULMONIC VALVE AV Area (Vmax): 3.00 cm     PV Vmax:       0.77 m/s AV Vmax:        143.80 cm/s  PV Peak grad:  2.3 mmHg AV Peak Grad:   8.3 mmHg LVOT Vmax:      124.50 cm/s LVOT Vmean:     76.950 cm/s LVOT VTI:       0.202 m  AORTA Ao Root diam: 3.10 cm Ao Asc diam:  3.50 cm MITRAL VALVE               TRICUSPID VALVE MV Area (PHT): 6.79 cm    TR Peak grad:   16.6 mmHg MV Decel Time: 112 msec    TR Vmax:        204.00 cm/s MV E velocity: 97.07 cm/s                            SHUNTS                            Systemic VTI:  0.20 m                            Systemic Diam: 2.10 cm Rozann Lesches MD Electronically signed by Rozann Lesches MD Signature Date/Time: 10/03/2021/3:05:32 PM    Final     Anti-infectives: Anti-infectives (From admission, onward)    Start     Dose/Rate Route Frequency Ordered Stop   10/05/21 2200  vancomycin (VANCOREADY) IVPB 1500 mg/300 mL  Status:  Discontinued        1,500 mg 150 mL/hr over 120 Minutes Intravenous Every 48 hours 10/04/21 1101 10/05/21 0841   10/05/21 1000  cefTRIAXone (ROCEPHIN) 2 g in sodium chloride 0.9 % 100 mL IVPB        2 g 200 mL/hr over 30 Minutes Intravenous Every 24 hours 10/05/21 0844     10/04/21 2200  ceFEPIme (MAXIPIME) 2 g in sodium chloride 0.9 % 100 mL IVPB  Status:  Discontinued        2 g 200 mL/hr over 30 Minutes Intravenous Every 12 hours 10/04/21 1101 10/05/21 0844   10/03/21 2200  vancomycin (VANCOREADY) IVPB 1250 mg/250 mL  Status:  Discontinued        1,250 mg 166.7 mL/hr over 90 Minutes Intravenous Every 48 hours 10/02/21 0221 10/04/21 1101   10/02/21 1800  ceFEPIme (MAXIPIME) 2 g in sodium chloride 0.9 % 100 mL IVPB  Status:  Discontinued        2 g 200 mL/hr over 30 Minutes Intravenous Every 24 hours 10/02/21 0221 10/04/21 1101   10/02/21 0100  metroNIDAZOLE (FLAGYL) IVPB 500 mg  Status:  Discontinued        500 mg 100 mL/hr over 60  Minutes Intravenous 2 times daily 10/02/21 0033 10/03/21 1234   10/02/21 0000  vancomycin (VANCOREADY) IVPB 2000 mg/400 mL        2,000 mg 200 mL/hr over 120 Minutes Intravenous  Once 10/01/21 2326 10/02/21 0213   10/01/21 2245  vancomycin (VANCOCIN) IVPB 1000 mg/200 mL premix  Status:  Discontinued        1,000 mg 200 mL/hr over 60 Minutes Intravenous  Once 10/01/21 2243 10/01/21 2326   10/01/21 2245  ceFEPIme (MAXIPIME) 2 g in sodium chloride 0.9 % 100 mL IVPB        2 g 200 mL/hr over 30 Minutes Intravenous  Once 10/01/21 2243 10/02/21 0017       Assessment/Plan: Right buttock cellulitis -dont think any need for surgery or  debridement for her -please call back if needed  I reviewed last 24 h vitals and pain scores, last 48 h intake and output, last 24 h labs and trends, and last 24 h imaging results.  This care required straight-forward level of medical decision making.    Rolm Bookbinder 10/05/2021

## 2021-10-05 NOTE — Progress Notes (Signed)
VAST consult received to obtain IV access. Upon arrival to bedside, noted patient with 3 PIVs in place. Spoke with patient's nurse, Carloyn Manner who stated lab has been unable to collect lab work despite 3 attempts. He stated patient has an USGPIV with sluggish blood return. Upon further assessment, able to get good blood return from USGPIV with use of tourniquet above elbow and slow pull with 68mL syringe. Light green, blue, and lav tubes collected and sent to lab. No further IV access needed at this time.

## 2021-10-05 NOTE — Progress Notes (Signed)
Patient ID: Mallory Butler, female   DOB: 08-Sep-1950, 71 y.o.   MRN: [id number] [facility] Progress Note   Assessment/ Plan:   1. Acute kidney Injury on chronic kidney disease stage III: Likely ATN in the setting of cellulitis/sepsis.  Nonoliguric and labs pending from this morning-with diminishing urine output and hypotension noted overnight, anticipate some worsening of renal function.  She does not have any acute indications for dialysis based on exam/assessment for uremic symptoms. 2.  Septic shock secondary to cellulitis of right buttock: Ongoing management with vancomycin and she has been weaned off of pressors.  No indication for surgical wound exploration at this time based on evaluation by surgery and they will continue to monitor size of eschar.  Status post fluid bolus overnight for hypotension. 3.  Atrial fibrillation: Currently appears to be rate controlled and remains on amiodarone drip. 4.  History of congestive heart failure: Appears compensated at this time, continue to monitor fluid status closely.  Subjective:   Reports to be feeling uncomfortable from pain in her buttock and difficulty positioning herself   Objective:   BP 95/60   Pulse 65   Temp 98 F (36.7 C) (Oral)   Resp 16   Ht 5\' 3"  (1.6 m)   Wt 107 kg   SpO2 99%   BMI 41.79 kg/m   Intake/Output Summary (Last 24 hours) at 10/05/2021 0753 Last data filed at 10/05/2021 0700 Gross per 24 hour  Intake 3633.29 ml  Output 2375 ml  Net 1258.29 ml   Weight change: 0.5 kg  Physical Exam: Gen: Appears uncomfortable resting in recliner, eating breakfast CVS: Pulse irregularly irregular, normal rate, S1 and S2 normal Resp: Poor inspiratory effort with decreased breath sounds over bases, no rales/rhonchi Abd: Soft, obese, nontender, bowel sounds normal Ext: Trace lower extremity edema  Imaging: ECHOCARDIOGRAM COMPLETE  Result Date: 10/03/2021    ECHOCARDIOGRAM REPORT   Patient Name:   Mallory Butler  Date of Exam: 10/03/2021 Medical Rec #:  [id number]     Height:       63.0 in Accession #:    [id number]    Weight:       230.2 lb Date of Birth:  [birth year]     BSA:          2.053 m Patient Age:    76 years      BP:           98/60 mmHg Patient Gender: F             HR:           112 bpm. Exam Location:  Inpatient Procedure: 2D Echo, Cardiac Doppler and Color Doppler Indications:    Congestive heart failure  History:        Patient has prior history of Echocardiogram examinations, most                 recent 01/29/2021. CHF, CAD; Risk Factors:Diabetes and                 Dyslipidemia.  Sonographer:    Jefferey Pica Referring Phys: [place]  1. Left ventricular ejection fraction, by estimation, is 60 to 65%. The left ventricle has normal function. The left ventricle demonstrates regional wall motion abnormalities (see scoring diagram/findings for description). There is moderate concentric left ventricular hypertrophy. Left ventricular diastolic parameters are indeterminate.  2. Right ventricular systolic function is normal. The right ventricular size is normal. There is normal  pulmonary artery systolic pressure. The estimated right ventricular systolic pressure is 68.0 mmHg.  3. Left atrial size was moderately dilated.  4. The mitral valve is abnormal. Trivial mitral valve regurgitation.  5. The aortic valve is tricuspid. Aortic valve regurgitation is not visualized. Aortic valve sclerosis/calcification is present, without any evidence of aortic stenosis.  6. The inferior vena cava is normal in size with greater than 50% respiratory variability, suggesting right atrial pressure of 3 mmHg. Comparison(s): Prior images reviewed side by side. Overall LVEF has normalized in the range of 60-65%. Also relative improvement in wall motion abnormalities. FINDINGS  Left Ventricle: Left ventricular ejection fraction, by estimation, is 60 to 65%. The left ventricle has normal function. The left ventricle  demonstrates regional wall motion abnormalities. The left ventricular internal cavity size was normal in size. There is moderate concentric left ventricular hypertrophy. Left ventricular diastolic parameters are indeterminate.  LV Wall Scoring: The mid and distal anterior septum and apex are hypokinetic. The entire anterior wall, entire lateral wall, entire inferior wall, basal anteroseptal segment, mid inferoseptal segment, and basal inferoseptal segment are normal. Right Ventricle: The right ventricular size is normal. No increase in right ventricular wall thickness. Right ventricular systolic function is normal. There is normal pulmonary artery systolic pressure. The tricuspid regurgitant velocity is 2.04 m/s, and  with an assumed right atrial pressure of 3 mmHg, the estimated right ventricular systolic pressure is 32.1 mmHg. Left Atrium: Left atrial size was moderately dilated. Right Atrium: Right atrial size was normal in size. Pericardium: There is no evidence of pericardial effusion. Presence of epicardial fat layer. Mitral Valve: The mitral valve is abnormal. There is mild thickening of the mitral valve leaflet(s). There is mild calcification of the mitral valve leaflet(s). Mild mitral annular calcification. Trivial mitral valve regurgitation. Tricuspid Valve: The tricuspid valve is grossly normal. Tricuspid valve regurgitation is mild. Aortic Valve: The aortic valve is tricuspid. There is mild aortic valve annular calcification. Aortic valve regurgitation is not visualized. Aortic valve sclerosis/calcification is present, without any evidence of aortic stenosis. Aortic valve peak gradient measures 8.3 mmHg. Pulmonic Valve: The pulmonic valve was grossly normal. Pulmonic valve regurgitation is trivial. Aorta: The aortic root is normal in size and structure. Venous: The inferior vena cava is normal in size with greater than 50% respiratory variability, suggesting right atrial pressure of 3 mmHg. IAS/Shunts:  No atrial level shunt detected by color flow Doppler.  LEFT VENTRICLE PLAX 2D LVIDd:         4.50 cm LVIDs:         2.60 cm LV PW:         1.30 cm LV IVS:        1.30 cm LVOT diam:     2.10 cm LV SV:         70 LV SV Index:   34 LVOT Area:     3.46 cm  RIGHT VENTRICLE             IVC RV Basal diam:  2.60 cm     IVC diam: 1.70 cm RV S prime:     11.50 cm/s TAPSE (M-mode): 2.2 cm LEFT ATRIUM             Index        RIGHT ATRIUM           Index LA diam:        4.40 cm 2.14 cm/m   RA Area:     13.20 cm LA  Vol [facility]):   76.1 ml 37.08 ml/m  RA Volume:   28.15 ml  13.71 ml/m LA Vol (A4C):   86.5 ml 42.14 ml/m LA Biplane Vol: 82.9 ml 40.39 ml/m  AORTIC VALVE                 PULMONIC VALVE AV Area (Vmax): 3.00 cm     PV Vmax:       0.77 m/s AV Vmax:        143.80 cm/s  PV Peak grad:  2.3 mmHg AV Peak Grad:   8.3 mmHg LVOT Vmax:      124.50 cm/s LVOT Vmean:     76.950 cm/s LVOT VTI:       0.202 m  AORTA Ao Root diam: 3.10 cm Ao Asc diam:  3.50 cm MITRAL VALVE               TRICUSPID VALVE MV Area (PHT): 6.79 cm    TR Peak grad:   16.6 mmHg MV Decel Time: 112 msec    TR Vmax:        204.00 cm/s MV E velocity: 97.07 cm/s                            SHUNTS                            Systemic VTI:  0.20 m                            Systemic Diam: 2.10 cm Rozann Lesches MD Electronically signed by Rozann Lesches MD Signature Date/Time: 10/03/2021/3:05:32 PM    Final     Labs: BMET Recent Labs  Lab 10/01/21 1945 10/02/21 0540 10/02/21 1844 10/03/21 0955 10/04/21 0735  NA 131* 132* 130* 130* 136  K 4.5 4.2 4.1 3.9 3.5  CL 91* 96* 95* 96* 103  CO2 21* 21* 20* 19* 21*  GLUCOSE 465* 390* 295* 373* 225*  BUN 58* 62* 69* 83* 63*  CREATININE 2.02* 1.95* 2.23* 2.79* 1.46*  CALCIUM 8.8* 8.1* 8.3* 7.9* 8.4*  PHOS  --   --   --   --  2.3*   CBC Recent Labs  Lab 10/01/21 1945 10/02/21 0420 10/03/21 0955 10/04/21 0735  WBC 17.9* 17.4* 12.4* 15.6*  NEUTROABS 14.3*  --   --   --   HGB 11.6* 11.7* 11.5* 11.1*   HCT 35.1* 37.2 34.5* 33.9*  MCV 98.0 104.5* 95.6 96.9  PLT 296 238 330 379    Medications:     acetaminophen  500 mg Oral Q6H   aspirin EC  81 mg Oral Daily   atorvastatin  40 mg Oral Daily   Chlorhexidine Gluconate Cloth  6 each Topical Daily   ezetimibe  10 mg Oral Daily   insulin aspart  0-15 Units Subcutaneous Q4H   insulin aspart  5 Units Subcutaneous TID WC   insulin glargine-yfgn  46 Units Subcutaneous BID   levothyroxine  88 mcg Oral Q0600   midodrine  10 mg Oral TID WC   sodium chloride flush  3 mL Intravenous Q12H   ticagrelor  90 mg Oral BID   venlafaxine XR  150 mg Oral Q breakfast   Elmarie Shiley, MD 10/05/2021, 7:53 AM

## 2021-10-05 NOTE — TOC Initial Note (Signed)
Transition of Care [facility]) - Initial/Assessment Note    Patient Details  Name: Mallory Butler MRN: [id number] Date of Birth: 10-26-1950  Transition of Care [facility]) CM/SW Contact:    Milas Gain, [place] Phone Number: 10/05/2021, 4:37 PM  Clinical Narrative:                  CSW received consult for possible SNF placement at time of discharge. CSW spoke with patient at bedside regarding PT recommendation of SNF placement at time of discharge. Patient reports she comes from home alone. Patient expressed understanding of PT recommendation and is agreeable to SNF placement at time of discharge. Patient gave CSW permission to fax out initial referral near the [place] area. CSW discussed insurance authorization process and will provide Medicare SNF ratings list with accepted SNF bed offers when available. Patient reports she has not received the COVID vaccines. No further questions reported at this time. CSW to continue to follow and assist with discharge planning needs.    Expected Discharge Plan: Skilled Nursing Facility Barriers to Discharge: Continued Medical Work up   Patient Goals and CMS Choice Patient states their goals for this hospitalization and ongoing recovery are:: SNF CMS Medicare.gov Compare Post Acute Care list provided to:: Patient    Expected Discharge Plan and Services Expected Discharge Plan: [place] In-house Referral: Clinical Social Work     Living arrangements for the past 2 months: [place]                                      Prior Living Arrangements/Services Living arrangements for the past 2 months: Single Family Home Lives with:: Self Patient language and need for interpreter reviewed:: Yes Do you feel safe going back to the place where you live?: No   SNF  Need for Family Participation in Patient Care: Yes (Comment) Care giver support system in place?: Yes (comment)   Criminal Activity/Legal Involvement Pertinent to  Current Situation/Hospitalization: No - Comment as needed  Activities of Daily Living Home Assistive Devices/Equipment: Walker (specify type), Cane (specify quad or straight) ADL Screening (condition at time of admission) Patient's cognitive ability adequate to safely complete daily activities?: Yes Is the patient deaf or have difficulty hearing?: No Does the patient have difficulty seeing, even when wearing glasses/contacts?: No Does the patient have difficulty concentrating, remembering, or making decisions?: No Patient able to express need for assistance with ADLs?: Yes Does the patient have difficulty dressing or bathing?: No Independently performs ADLs?: Yes (appropriate for developmental age) Does the patient have difficulty walking or climbing stairs?: Yes Weakness of Legs: Both Weakness of Arms/Hands: None  Permission Sought/Granted Permission sought to share information with : Case Manager, Family Supports, Chartered certified accountant granted to share information with : Yes, Verbal Permission Granted  Share Information with NAME: Linwood Dibbles  Permission granted to share info w AGENCY: SNF  Permission granted to share info w Relationship: friend  Permission granted to share info w Contact Information: [place]  Emotional Assessment Appearance:: Appears stated age Attitude/Demeanor/Rapport: Gracious Affect (typically observed): Calm Orientation: : Oriented to Self, Oriented to Place, Oriented to  Time, Oriented to Situation Alcohol / Substance Use: Not Applicable Psych Involvement: No (comment)  Admission diagnosis:  Cellulitis of buttock [L03.317] Hyperglycemia [R73.9] AKI (acute kidney injury) ([place]) [N17.9] Severe sepsis ([place]) [A41.9, R65.20] Sepsis due to cellulitis ([place]) [L03.90, A41.9] Sepsis with acute renal failure  without septic shock, due to unspecified organism, unspecified acute renal failure type ([place]) [A41.9, R65.20, N17.9] Patient Active Problem  List   Diagnosis Date Noted   Septic shock ([place])    Severe sepsis due to cellulitis of right buttock ([place]) 10/01/2021   Cellulitis of right buttock 10/01/2021   Chronic combined systolic and diastolic CHF (congestive heart failure) ([place]) 10/01/2021   Atherosclerosis of native coronary artery of native heart without angina pectoris    S/P primary angioplasty with coronary stent    Ischemic cardiomyopathy    Acute combined systolic and diastolic heart failure ([place])    Calcification of native coronary artery    Atherosclerosis of aorta (HCC)    Benign hypertension with CKD (chronic kidney disease) stage III (HCC)    Class 3 severe obesity due to excess calories with serious comorbidity and body mass index (BMI) of 40.0 to 44.9 in adult [facility])    Insulin dependent type 2 diabetes mellitus ([place]) 02/02/2021   Essential hypertension 02/02/2021   Acute renal failure superimposed on stage 3b chronic kidney disease ([place]) 02/02/2021   Class 3 obesity ([place]) 02/02/2021   Hypothyroid 02/02/2021   LAD stenosis    NSTEMI (non-ST elevated myocardial infarction) ([place]) 02/01/2021   Acute CHF (congestive heart failure) ([place]) 02/01/2021   COVID-19 virus infection 02/01/2021   Type 2 diabetes mellitus without complication, with long-term current use of insulin ([place]) 05/18/2017   Diabetes ([place]) 07/14/2015   Hypertension associated with diabetes ([place]) 07/14/2015   Hyperlipidemia associated with type 2 diabetes mellitus ([place]) 07/14/2015   Hypothyroidism 07/14/2015   Obstructive sleep apnea 07/14/2015   PCP:  Greig Right, MD Pharmacy:   [id number] [address], [place] [address] Phone: [phone] Fax: [phone]     Social Determinants of Health (SDOH) Interventions    Readmission Risk Interventions     No data to display

## 2021-10-05 NOTE — Progress Notes (Signed)
[facility] Progress Note Patient Name: Mallory Butler DOB: 14-Jul-1950 MRN: [id number]   Date of Service  10/05/2021  HPI/Events of Note  Notified of hypotension. BP 84/47, with MAP of 60.  Pt had been on neosynephrine, which has been weaned off.  Review of I/O shows pt to be net negative 263ml  eICU Interventions  Placed order for 549ml NS bolus.  Will monitor response.         [place] 10/05/2021, 5:37 AM

## 2021-10-05 NOTE — TOC Benefit Eligibility Note (Signed)
Patient Teacher, English as a foreign language completed.    The current 30 day co-pay is, $145.08 for Xarelto 20mg  .  The current 30 day co-pay is,$149.97 for Eliquis 5mg .   The copays reflect patient is in the Coverage Gap (donut hole)   The patient is insured through [place] Part D.    Otis Brace, [place] Patient Advocate Specialist [place] Patient Advocate Team Direct Number: [phone]  Fax: [phone]

## 2021-10-06 DIAGNOSIS — L03317 Cellulitis of buttock: Secondary | ICD-10-CM | POA: Diagnosis not present

## 2021-10-06 DIAGNOSIS — A419 Sepsis, unspecified organism: Secondary | ICD-10-CM | POA: Diagnosis not present

## 2021-10-06 DIAGNOSIS — N179 Acute kidney failure, unspecified: Secondary | ICD-10-CM | POA: Diagnosis not present

## 2021-10-06 DIAGNOSIS — R6520 Severe sepsis without septic shock: Secondary | ICD-10-CM | POA: Diagnosis not present

## 2021-10-06 LAB — GLUCOSE, CAPILLARY
Glucose-Capillary: 85 mg/dL (ref 70–99)
Glucose-Capillary: 131 mg/dL — ABNORMAL HIGH (ref 70–99)
Glucose-Capillary: 104 mg/dL — ABNORMAL HIGH (ref 70–99)
Glucose-Capillary: 80 mg/dL (ref 70–99)
Glucose-Capillary: 133 mg/dL — ABNORMAL HIGH (ref 70–99)

## 2021-10-06 LAB — RENAL FUNCTION PANEL
Albumin: <1.5 g/dL — ABNORMAL LOW (ref 3.5–5.0)
Anion gap: 7 (ref 5–15)
BUN: 33 mg/dL — ABNORMAL HIGH (ref 8–23)
CO2: 23 mmol/L (ref 22–32)
Calcium: 8.2 mg/dL — ABNORMAL LOW (ref 8.9–10.3)
Chloride: 110 mmol/L (ref 98–111)
Creatinine, Ser: 0.99 mg/dL (ref 0.44–1.00)
GFR, Estimated: >60 mL/min (ref >60)
Glucose, Bld: 86 mg/dL (ref 70–99)
Phosphorus: 2.3 mg/dL — ABNORMAL LOW (ref 2.5–4.6)
Potassium: 3.4 mmol/L — ABNORMAL LOW (ref 3.5–5.1)
Sodium: 140 mmol/L (ref 135–145)

## 2021-10-06 LAB — CULTURE, BLOOD (ROUTINE X 2)
Culture: NO GROWTH
Culture: NO GROWTH
Special Requests: ADEQUATE
Special Requests: ADEQUATE

## 2021-10-06 LAB — CBC
HCT: 27.7 % — ABNORMAL LOW (ref 36.0–46.0)
Hemoglobin: 9.1 g/dL — ABNORMAL LOW (ref 12.0–15.0)
MCH: 31.6 pg (ref 26.0–34.0)
MCHC: 32.9 g/dL (ref 30.0–36.0)
MCV: 96.2 fL (ref 80.0–100.0)
Platelets: 343 10*3/uL (ref 150–400)
RBC: 2.88 MIL/uL — ABNORMAL LOW (ref 3.87–5.11)
RDW: 13.8 % (ref 11.5–15.5)
WBC: 11.5 10*3/uL — ABNORMAL HIGH (ref 4.0–10.5)
nRBC: 0.3 % — ABNORMAL HIGH (ref 0.0–0.2)

## 2021-10-06 MED ORDER — INSULIN ASPART 100 UNIT/ML IJ SOLN: 0.0000 [IU] | Freq: Three times a day (TID) | INTRAMUSCULAR | Status: DC

## 2021-10-06 MED ORDER — POTASSIUM CHLORIDE CRYS ER 20 MEQ PO TBCR
40.0000 meq | EXTENDED_RELEASE_TABLET | ORAL | Status: AC
  Administered 2021-10-06 (×2): 40 meq via ORAL
  Filled 2021-10-06 (×2): qty 2

## 2021-10-06 MED ORDER — ACETAMINOPHEN 500 MG PO TABS
1000.0000 mg | ORAL_TABLET | Freq: Three times a day (TID) | ORAL | Status: DC
  Administered 2021-10-06 – 2021-10-10 (×15): 1000 mg via ORAL
  Filled 2021-10-06 (×15): qty 2

## 2021-10-06 MED ORDER — BISACODYL 10 MG RE SUPP
10.0000 mg | Freq: Once | RECTAL | Status: AC
  Administered 2021-10-06: 10 mg via RECTAL
  Filled 2021-10-06: qty 1

## 2021-10-06 MED ORDER — OXYCODONE HCL 5 MG PO TABS
5.0000 mg | ORAL_TABLET | ORAL | Status: DC | PRN
  Administered 2021-10-06 – 2021-10-09 (×13): 10 mg via ORAL
  Filled 2021-10-06 (×13): qty 2

## 2021-10-06 NOTE — Progress Notes (Signed)
Patient ID: Mallory Butler, female   DOB: 1950-06-13, 71 y.o.   MRN: [id number] [facility] Progress Note   Assessment/ Plan:   1. Acute kidney Injury on chronic kidney disease stage III: Likely ATN in the setting of cellulitis/sepsis.  Nonoliguric and labs from this morning show renal recovery back to baseline.  She d maintains good urine output and hypokalemia has been corrected by primary service.  Nephrology service will sign off at this time and remain available for questions or concerns.  She can be reliably followed up by her primary care provider as renal function has gone back to baseline. 2.  Septic shock secondary to cellulitis of right buttock: Ongoing management with vancomycin and she has been weaned off of pressors.  She was seen yesterday by surgery again and found not to have any indications for surgical intervention/debridement. 3.  Atrial fibrillation: Currently appears to be rate controlled and remains on amiodarone drip. 4.  History of congestive heart failure: Appears compensated at this time, continue to monitor fluid status closely.  Subjective:   Continues to have discomfort in her right buttock and difficulty with positioning in bed.   Objective:   BP (!) 108/52 (BP Location: Left Arm)   Pulse 71   Temp 98.4 F (36.9 C) (Oral)   Resp 13   Ht 5\' 3"  (1.6 m)   Wt 107 kg   SpO2 97%   BMI 41.79 kg/m   Intake/Output Summary (Last 24 hours) at 10/06/2021 1120 Last data filed at 10/06/2021 0850 Gross per 24 hour  Intake 220 ml  Output 1100 ml  Net -880 ml   Weight change:   Physical Exam: Gen: Resting on her left side in bed. CVS: Pulse irregularly irregular, normal rate, S1 and S2 normal Resp: Poor inspiratory effort with decreased breath sounds over bases, no rales/rhonchi Abd: Soft, obese, nontender, bowel sounds normal Ext: Trace lower extremity edema  Imaging: No results found.  Labs: BMET Recent Labs  Lab 10/01/21 1945 10/02/21 0540  10/02/21 1844 10/03/21 0955 10/04/21 0735 10/05/21 0956 10/06/21 0357  NA 131* 132* 130* 130* 136 139 140  K 4.5 4.2 4.1 3.9 3.5 3.3* 3.4*  CL 91* 96* 95* 96* 103 106 110  CO2 21* 21* 20* 19* 21* 23 23  GLUCOSE 465* 390* 295* 373* 225* 144* 86  BUN 58* 62* 69* 83* 63* 40* 33*  CREATININE 2.02* 1.95* 2.23* 2.79* 1.46* 1.07* 0.99  CALCIUM 8.8* 8.1* 8.3* 7.9* 8.4* 8.3* 8.2*  PHOS  --   --   --   --  2.3* 2.1* 2.3*   CBC Recent Labs  Lab 10/01/21 1945 10/02/21 0420 10/03/21 0955 10/04/21 0735 10/05/21 0956 10/06/21 0357  WBC 17.9*   < > 12.4* 15.6* 13.5* 11.5*  NEUTROABS 14.3*  --   --   --   --   --   HGB 11.6*   < > 11.5* 11.1* 9.8* 9.1*  HCT 35.1*   < > 34.5* 33.9* 30.2* 27.7*  MCV 98.0   < > 95.6 96.9 98.4 96.2  PLT 296   < > 330 379 343 343   < > = values in this interval not displayed.    Medications:     acetaminophen  1,000 mg Oral TID   apixaban  5 mg Oral BID   atorvastatin  40 mg Oral Daily   bisacodyl  10 mg Rectal Once   Chlorhexidine Gluconate Cloth  6 each Topical Daily   clopidogrel  75 mg Oral Daily   ezetimibe  10 mg Oral Daily   insulin aspart  0-15 Units Subcutaneous TID WC   insulin aspart  5 Units Subcutaneous TID WC   insulin glargine-yfgn  46 Units Subcutaneous BID   levothyroxine  88 mcg Oral Q0600   midodrine  10 mg Oral TID WC   potassium chloride  40 mEq Oral Q4H   sodium chloride flush  3 mL Intravenous Q12H   venlafaxine XR  150 mg Oral Q breakfast   Elmarie Shiley, MD 10/06/2021, 11:20 AM

## 2021-10-06 NOTE — NC FL2 (Signed)
[place] LEVEL OF CARE SCREENING TOOL     IDENTIFICATION  Patient Name: Mallory Butler Birthdate: [birth year] Sex: female Admission Date (Current Location): 10/01/2021  [facility] and [place] Number:  [employer] and Address:  [facility] [place], [place] [address], [place], [place] [address]      Provider Number: [id number]  Attending Physician Name and Address:  Debbe Odea, MD  Relative Name and Phone Number:       Current Level of Care: Hospital Recommended Level of Care: [place] Prior Approval Number:    Date Approved/Denied:   PASRR Number: [id number]  Discharge Plan: SNF    Current Diagnoses: Patient Active Problem List   Diagnosis Date Noted   Septic shock ([place])    Severe sepsis due to cellulitis of right buttock ([place]) 10/01/2021   Cellulitis of right buttock 10/01/2021   Chronic combined systolic and diastolic CHF (congestive heart failure) ([place]) 10/01/2021   Atherosclerosis of native coronary artery of native heart without angina pectoris    S/P primary angioplasty with coronary stent    Ischemic cardiomyopathy    Acute combined systolic and diastolic heart failure ([place])    Calcification of native coronary artery    Atherosclerosis of aorta (HCC)    Benign hypertension with CKD (chronic kidney disease) stage III ([place])    Class 3 severe obesity due to excess calories with serious comorbidity and body mass index (BMI) of 40.0 to 44.9 in adult [facility])    Insulin dependent type 2 diabetes mellitus ([place]) 02/02/2021   Essential hypertension 02/02/2021   Acute renal failure superimposed on stage 3b chronic kidney disease ([place]) 02/02/2021   Class 3 obesity ([place]) 02/02/2021   Hypothyroid 02/02/2021   LAD stenosis    NSTEMI (non-ST elevated myocardial infarction) ([place]) 02/01/2021   Acute CHF (congestive heart failure) ([place]) 02/01/2021   COVID-19 virus infection 02/01/2021   Type 2 diabetes mellitus without  complication, with long-term current use of insulin ([place]) 05/18/2017   Diabetes ([place]) 07/14/2015   Hypertension associated with diabetes ([place]) 07/14/2015   Hyperlipidemia associated with type 2 diabetes mellitus ([place]) 07/14/2015   Hypothyroidism 07/14/2015   Obstructive sleep apnea 07/14/2015    Orientation RESPIRATION BLADDER Height & Weight     Self, Time, Situation, Place  Normal Continent, Indwelling catheter Weight: 235 lb 14.3 oz (107 kg) Height:  5\' 3"  (160 cm)  BEHAVIORAL SYMPTOMS/MOOD NEUROLOGICAL BOWEL NUTRITION STATUS      Continent Diet (See dc summary)  AMBULATORY STATUS COMMUNICATION OF NEEDS Skin   Limited Assist Verbally Other (Comment) (wound on buttocks)                       Personal Care Assistance Level of Assistance  Bathing, Feeding, Dressing Bathing Assistance: Limited assistance Feeding assistance: Independent Dressing Assistance: Limited assistance     Functional Limitations Info  Sight Sight Info: Impaired        SPECIAL CARE FACTORS FREQUENCY  PT (By licensed PT), OT (By licensed OT)     PT Frequency: 5x/week OT Frequency: 5x/week            Contractures Contractures Info: Not present    Additional Factors Info  Code Status, Allergies, Psychotropic, Insulin Sliding Scale Code Status Info: Full Allergies Info: NKA Psychotropic Info: Effexor Insulin Sliding Scale Info: see dc summary       Current Medications (10/06/2021):  This is the current hospital active medication list Current Facility-Administered  Medications  Medication Dose Route Frequency Provider Last Rate Last Admin   0.9 %  sodium chloride infusion  250 mL Intravenous Continuous Debbe Odea, MD   Stopped at 10/04/21 2342   acetaminophen (TYLENOL) tablet 1,000 mg  1,000 mg Oral TID Debbe Odea, MD       apixaban (ELIQUIS) tablet 5 mg  5 mg Oral BID Icard, Bradley L, DO   5 mg at 10/06/21 0914   atorvastatin (LIPITOR) tablet 40 mg  40 mg Oral Daily Lenore Cordia, MD   40 mg at 10/06/21 0914   bisacodyl (DULCOLAX) suppository 10 mg  10 mg Rectal Once Debbe Odea, MD       cefTRIAXone (ROCEPHIN) 2 g in sodium chloride 0.9 % 100 mL IVPB  2 g Intravenous Q24H Icard, Bradley L, DO 200 mL/hr at 10/06/21 0922 2 g at 10/06/21 4967   Chlorhexidine Gluconate Cloth 2 % PADS 6 each  6 each Topical Daily Collene Gobble, MD   6 each at 10/05/21 1030   clopidogrel (PLAVIX) tablet 75 mg  75 mg Oral Daily Icard, Bradley L, DO   75 mg at 10/06/21 0914   ezetimibe (ZETIA) tablet 10 mg  10 mg Oral Daily Zada Finders R, MD   10 mg at 10/06/21 0914   insulin aspart (novoLOG) injection 0-15 Units  0-15 Units Subcutaneous TID WC Rizwan, Eunice Blase, MD       insulin aspart (novoLOG) injection 5 Units  5 Units Subcutaneous TID WC Julian Hy, DO   5 Units at 10/06/21 0915   insulin glargine-yfgn (SEMGLEE) injection 46 Units  46 Units Subcutaneous BID Debbe Odea, MD   46 Units at 10/06/21 0936   lactated ringers bolus 500 mL  500 mL Intravenous Once Jennelle Human B, NP       levothyroxine (SYNTHROID) tablet 88 mcg  88 mcg Oral Q0600 Lenore Cordia, MD   88 mcg at 10/06/21 0451   melatonin tablet 5 mg  5 mg Oral QHS PRN Bowser, Laurel Dimmer, NP       midodrine (PROAMATINE) tablet 10 mg  10 mg Oral TID WC Bowser, Laurel Dimmer, NP   10 mg at 10/06/21 0914   ondansetron (ZOFRAN) tablet 4 mg  4 mg Oral Q6H PRN Lenore Cordia, MD       Or   ondansetron (ZOFRAN) injection 4 mg  4 mg Intravenous Q6H PRN Zada Finders R, MD       oxyCODONE (Oxy IR/ROXICODONE) immediate release tablet 5-10 mg  5-10 mg Oral Q4H PRN Debbe Odea, MD   10 mg at 10/06/21 0914   potassium chloride SA (KLOR-CON M) CR tablet 40 mEq  40 mEq Oral Q4H Rizwan, Eunice Blase, MD   40 mEq at 10/06/21 0914   sodium chloride flush (NS) 0.9 % injection 3 mL  3 mL Intravenous Q12H Zada Finders R, MD   3 mL at 10/06/21 0924   venlafaxine XR (EFFEXOR-XR) 24 hr capsule 150 mg  150 mg Oral Q breakfast Lenore Cordia, MD   150 mg at  10/06/21 5916     Discharge Medications: Please see discharge summary for a list of discharge medications.  Relevant Imaging Results:  Relevant Lab Results:   Additional Information SSN: [place], [place]

## 2021-10-06 NOTE — Evaluation (Signed)
Occupational Therapy Evaluation Patient Details Name: Mallory Butler MRN: [id number] DOB: October 20, 1950 Today's Date: 10/06/2021   History of Present Illness 71 yo female admitted 9/28 with Rt buttock cellulitis. Afib and hypotension prompted transfer to ICU 9/30. PMhx: CHF, DM, CAD, HTN and HLD   Clinical Impression   PTA, pt was living alone and was independent; using a cane recently. Pt currently requiring Min A for UB ADLs, Max A for LB ADLs, and Min A for functional mobility with RW. Pt presenting with decreased balance, strength, ROM, and activity tolerance. Limited by pain. Pt would benefit from further acute OT to facilitate safe dc. Recommend dc to SNF for further OT to optimize safety, independence with ADLs, and return to PLOF.      Recommendations for follow up therapy are one component of a multi-disciplinary discharge planning process, led by the attending physician.  Recommendations may be updated based on patient status, additional functional criteria and insurance authorization.   Follow Up Recommendations  Skilled nursing-short term rehab (<3 hours/day)    Assistance Recommended at Discharge Frequent or constant Supervision/Assistance  Patient can return home with the following A little help with walking and/or transfers;A lot of help with bathing/dressing/bathroom;Assistance with cooking/housework    Functional Status Assessment  Patient has had a recent decline in their functional status and demonstrates the ability to make significant improvements in function in a reasonable and predictable amount of time.  Equipment Recommendations  Other (comment) (Defer to next venue)    Recommendations for Other Services       Precautions / Restrictions Precautions Precautions: Fall Precaution Comments: watch HR and BP      Mobility Bed Mobility Overal bed mobility: Needs Assistance Bed Mobility: Supine to Sit, Sit to Supine     Supine to sit: Min guard, HOB elevated Sit  to supine: Min assist   General bed mobility comments: Min Guard A for safety. Min A for managing BLES over EOB    Transfers Overall transfer level: Needs assistance Equipment used: Rolling walker (2 wheels) Transfers: Sit to/from Stand Sit to Stand: Min assist           General transfer comment: Min A for power up into standing      Balance Overall balance assessment: Needs assistance Sitting-balance support: No upper extremity supported, Feet supported Sitting balance-Leahy Scale: Fair     Standing balance support: Bilateral upper extremity supported, During functional activity Standing balance-Leahy Scale: Poor                             ADL either performed or assessed with clinical judgement   ADL Overall ADL's : Needs assistance/impaired Eating/Feeding: Set up;Bed level   Grooming: Supervision/safety;Set up;Sitting   Upper Body Bathing: Minimal assistance;Sitting   Lower Body Bathing: Maximal assistance;Sit to/from stand   Upper Body Dressing : Minimal assistance;Sitting   Lower Body Dressing: Maximal assistance;Sit to/from stand   Toilet Transfer: Minimal assistance;Ambulation;Rolling walker (2 wheels);Regular Toilet;Grab bars Toilet Transfer Details (indicate cue type and reason): Min A for power up from toilet Toileting- Clothing Manipulation and Hygiene: Maximal assistance;Sit to/from stand Toileting - Clothing Manipulation Details (indicate cue type and reason): Max A for peri care due to pain     Functional mobility during ADLs: Minimal assistance;Rolling walker (2 wheels) General ADL Comments: Pt presenting with decreased balance, strength, ROM, and activity tolerance.     Vision         Perception  Praxis      Pertinent Vitals/Pain Pain Assessment Pain Assessment: Faces Faces Pain Scale: Hurts even more Pain Location: Rt buttock Pain Descriptors / Indicators: Aching, Guarding Pain Intervention(s): Monitored during  session, Limited activity within patient's tolerance, Repositioned     Hand Dominance Left   Extremity/Trunk Assessment Upper Extremity Assessment Upper Extremity Assessment: Overall WFL for tasks assessed   Lower Extremity Assessment Lower Extremity Assessment: Defer to PT evaluation   Cervical / Trunk Assessment Cervical / Trunk Assessment: Other exceptions Cervical / Trunk Exceptions: Increased body habitus   Communication Communication Communication: No difficulties   Cognition Arousal/Alertness: Awake/alert Behavior During Therapy: WFL for tasks assessed/performed Overall Cognitive Status: Within Functional Limits for tasks assessed                                       General Comments  VSS on RA    Exercises     Shoulder Instructions      Home Living Family/patient expects to be discharged to:: Private residence Living Arrangements: Alone Available Help at Discharge: Family;Available PRN/intermittently;Neighbor Type of Home: Mobile home Home Access: Stairs to enter [employer] of Steps: 7   Home Layout: One level     Bathroom Shower/Tub: Producer, television/film/video: Handicapped height     Home Equipment: Shower seat;Cane - single point;Electric scooter   Additional Comments: battery dead on scooter      Prior Functioning/Environment Prior Level of Function : Independent/Modified Independent;Driving             Mobility Comments: using a cane recently ADLs Comments: Independent with ADLs/IADLs; manages own meds; drives        OT Problem List: Decreased strength;Decreased range of motion;Decreased activity tolerance;Impaired balance (sitting and/or standing);Decreased safety awareness;Decreased knowledge of use of DME or AE;Decreased knowledge of precautions;Pain      OT Treatment/Interventions: Self-care/ADL training;Therapeutic exercise;Energy conservation;DME and/or AE instruction;Therapeutic  activities;Patient/family education    OT Goals(Current goals can be found in the care plan section) Acute Rehab OT Goals Patient Stated Goal: Get stronger and stop pain OT Goal Formulation: With patient Time For Goal Achievement: 10/20/21 Potential to Achieve Goals: Good  OT Frequency: Min 2X/week    Co-evaluation              AM-PAC OT "6 Clicks" Daily Activity     Outcome Measure Help from another person eating meals?: A Little Help from another person taking care of personal grooming?: A Little Help from another person toileting, which includes using toliet, bedpan, or urinal?: A Lot Help from another person bathing (including washing, rinsing, drying)?: A Lot Help from another person to put on and taking off regular upper body clothing?: A Little Help from another person to put on and taking off regular lower body clothing?: A Lot 6 Click Score: 15   End of Session Equipment Utilized During Treatment: Rolling walker (2 wheels) Nurse Communication: Mobility status  Activity Tolerance: Patient tolerated treatment well Patient left: in bed;with call bell/phone within reach  OT Visit Diagnosis: Unsteadiness on feet (R26.81);Other abnormalities of gait and mobility (R26.89);Muscle weakness (generalized) (M62.81);Pain Pain - Right/Left: Right Pain - part of body:  (bottom)                Time: 0321-2248 OT Time Calculation (min): 45 min Charges:  OT General Charges $OT Visit: 1 Visit OT Evaluation $OT Eval Moderate Complexity: 1  Mod OT Treatments $Self Care/Home Management : 23-37 mins  Hoang Reich MSOT, OTR/L Acute Rehab Office: [phone]  Theodoro Grist Batool Majid 10/06/2021, 3:25 PM

## 2021-10-06 NOTE — Progress Notes (Addendum)
[facility] Progress Note  Patient: Mallory Butler     YBO:[id number]  DOA: 10/01/2021   PCP: Greig Right, MD       Brief hospital course: 71 y/o female with CAD s/p PCI, IDDM, HTn, HLD, chronic combined systolic and diastolic CHF (EF 27-78% 2/42/35) who presented to the ED for a right buttock infection that had grown from a small nodule noted 5 days prior. She also admits to diarrhea about 4-5 episodes a day for about 3 days. No nausea or vomiting.   WBC 17.9, Procalcitonin 12.27, Lactic acid 3.9 Na 131, Bun 58, Cr 2.02 (baseline 0.79) CO2 21 Glucose 465, BHBA 2.33 A1c 10.4  CT pelvis w/o contrast> cellulitis and developing phlegmon in the right buttock subcutaneous fat, edema within the right gluteus maximus concerning for infectious myositis.  9/30- noted to have rising Cr and A-fib RVR, Given NS bolus and then IV Lopressor- she developed shock and was transferred to the ICU- started on Amiodarone and Phenylephrine  - transitioned to Midodrine only - transferred out of ICU on 10/3  Subjective:  She continues to have pain in her buttocks as before and also has right hip pain. She admits to being constipated.   Assessment and Plan: Principal Problem:   Septic shock due to cellulitis of right buttock (HCC) - erythema present on right buttock and a portion of the left buttock- scab in the center of the left buttock with drainage noted on sheets - cellulitis looks improved (not resolved) but she continues to have pain - previously Vancomycin, Flagyl and Cefepime- 10/2> transitioned to Ceftriaxone - increase Oxycodone to Q4 PRN- add APAP 1000 mg TID - leukocytosis improving - general surgery asked to evaluate patient- no need for intervention  Active Problems:     Acute renal failure superimposed on stage 3b chronic kidney disease (HCC) Chronic combined systolic and diastolic CHF (congestive heart failure) (HCC) Hyponatremia  - due to diarrhea and diuretics -  Holding Entresto and Spironolactone  - Cr 2.02> 2.79> Cr 3.61 today  Metabolic acidosis with anion gap - due to AKI and diarrhea - resolved    DKA with Insulin dependent type 2 diabetes mellitus ([place]) Severely uncontrolled CBGs - we spoke extensively about controlling her sugars - she takes 75/25 35 U in AM - Based on her evening glucose checks, sometimes skips the evening dose because her sugars sometimes tends to drop to 60s in the middle of the night - also takes Glipizide and Metformin - A1c 10.4 - she is interested in trying long acting insulin and mealtime Novolog - cont Semglee at 46 U BID and Novolog 5 U with meals - dc SSI   Diarrhea (resolved)- now constipated - does not seem infectious- resolved and now constipated - Dulcolax suppository once   Hypotension with h/o Hypertension  - SBP ~ 90-100 when admitted - Bisoprolol on hold  A- fib with RVR, Troponin elevation  - Trop max 130 due to demand ischemia - converted to NSR - CHA2DS2-VASc Score 4 - Eliquis  - ASA & Brillinta > Plavix   CAD/ Hyperlipidemia  - stenting of prox LAD in 02/06/21 when she had an NSTEMI - Zetia - Brillinta, ASA (DTAP x 1 yr) changed to Plavix    Hypothyroidism Synthroid  Morbid obesity Body mass index is 41.79 kg/m.   Disposition: SNF- patient in agreement     Code Status: Full Code DVT prophylaxis:   apixaban (ELIQUIS) tablet 5 mg   Consultants: none Level of  Care: Level of care: Progressive  Objective:   Vitals:   10/05/21 1600 10/05/21 1713 10/05/21 1951 10/06/21 0840  BP: 114/62 (!) 110/53 (!) 97/54 (!) 108/52  Pulse: 69 65 66 71  Resp: 17 14 16 13   Temp: 98.4 F (36.9 C) 98.3 F (36.8 C) 98.4 F (36.9 C) 98.4 F (36.9 C)  TempSrc: Oral Oral Oral Oral  SpO2: 100% 97% 92% 97%  Weight:      Height:       Filed Weights   10/03/21 0500 10/04/21 0500 10/05/21 0500  Weight: 104.4 kg 106.5 kg 107 kg   Exam: General exam: Appears comfortable  HEENT: oral mucosa  moist Respiratory system: Clear to auscultation.  Cardiovascular system: S1 & S2 heard  Gastrointestinal system: Abdomen soft, non-tender, nondistended. Normal bowel sounds   Extremities: No cyanosis, clubbing- right buttock indurated and tender- scab still present Psychiatry: depressed   Imaging and lab data was personally reviewed    CBC: Recent Labs  Lab 10/01/21 1945 10/02/21 0420 10/03/21 0955 10/04/21 0735 10/05/21 0956 10/06/21 0357  WBC 17.9* 17.4* 12.4* 15.6* 13.5* 11.5*  NEUTROABS 14.3*  --   --   --   --   --   HGB 11.6* 11.7* 11.5* 11.1* 9.8* 9.1*  HCT 35.1* 37.2 34.5* 33.9* 30.2* 27.7*  MCV 98.0 104.5* 95.6 96.9 98.4 96.2  PLT 296 238 330 379 343 [id number]    Basic Metabolic Panel: Recent Labs  Lab 10/02/21 1844 10/03/21 0955 10/03/21 1140 10/04/21 0735 10/05/21 0956 10/06/21 0357  NA 130* 130*  --  136 139 140  K 4.1 3.9  --  3.5 3.3* 3.4*  CL 95* 96*  --  103 106 110  CO2 20* 19*  --  21* 23 23  GLUCOSE 295* 373*  --  225* 144* 86  BUN 69* 83*  --  63* 40* 33*  CREATININE 2.23* 2.79*  --  1.46* 1.07* 0.99  CALCIUM 8.3* 7.9*  --  8.4* 8.3* 8.2*  MG  --   --  2.1 2.0  --   --   PHOS  --   --   --  2.3* 2.1* 2.3*    GFR: Estimated Creatinine Clearance: 61.9 mL/min (by C-G formula based on SCr of 0.99 mg/dL).  Scheduled Meds:  acetaminophen  1,000 mg Oral TID   apixaban  5 mg Oral BID   atorvastatin  40 mg Oral Daily   Chlorhexidine Gluconate Cloth  6 each Topical Daily   clopidogrel  75 mg Oral Daily   ezetimibe  10 mg Oral Daily   insulin aspart  0-15 Units Subcutaneous TID WC   insulin aspart  5 Units Subcutaneous TID WC   insulin glargine-yfgn  46 Units Subcutaneous BID   levothyroxine  88 mcg Oral Q0600   midodrine  10 mg Oral TID WC   sodium chloride flush  3 mL Intravenous Q12H   venlafaxine XR  150 mg Oral Q breakfast   Continuous Infusions:  sodium chloride Stopped (10/04/21 2342)   cefTRIAXone (ROCEPHIN)  IV 2 g (10/06/21 0922)    lactated ringers       LOS: 5 days   Author: Debbe Odea  10/06/2021 5:09 PM

## 2021-10-06 NOTE — Discharge Instructions (Signed)

## 2021-10-06 NOTE — Plan of Care (Signed)
  Problem: Fluid Volume: Goal: Hemodynamic stability will improve Outcome: Progressing   

## 2021-10-07 DIAGNOSIS — L03317 Cellulitis of buttock: Secondary | ICD-10-CM | POA: Diagnosis not present

## 2021-10-07 DIAGNOSIS — A419 Sepsis, unspecified organism: Secondary | ICD-10-CM | POA: Diagnosis not present

## 2021-10-07 DIAGNOSIS — R6521 Severe sepsis with septic shock: Secondary | ICD-10-CM | POA: Diagnosis not present

## 2021-10-07 DIAGNOSIS — N179 Acute kidney failure, unspecified: Secondary | ICD-10-CM | POA: Diagnosis not present

## 2021-10-07 LAB — BASIC METABOLIC PANEL
Anion gap: 12 (ref 5–15)
BUN: 24 mg/dL — ABNORMAL HIGH (ref 8–23)
CO2: 24 mmol/L (ref 22–32)
Calcium: 9 mg/dL (ref 8.9–10.3)
Chloride: 109 mmol/L (ref 98–111)
Creatinine, Ser: 0.94 mg/dL (ref 0.44–1.00)
GFR, Estimated: >60 mL/min (ref >60)
Glucose, Bld: 124 mg/dL — ABNORMAL HIGH (ref 70–99)
Potassium: 5.4 mmol/L — ABNORMAL HIGH (ref 3.5–5.1)
Sodium: 145 mmol/L (ref 135–145)

## 2021-10-07 LAB — CBC
HCT: 30.2 % — ABNORMAL LOW (ref 36.0–46.0)
Hemoglobin: 9.9 g/dL — ABNORMAL LOW (ref 12.0–15.0)
MCH: 32.6 pg (ref 26.0–34.0)
MCHC: 32.8 g/dL (ref 30.0–36.0)
MCV: 99.3 fL (ref 80.0–100.0)
Platelets: 338 10*3/uL (ref 150–400)
RBC: 3.04 MIL/uL — ABNORMAL LOW (ref 3.87–5.11)
RDW: 14.2 % (ref 11.5–15.5)
WBC: 12.4 10*3/uL — ABNORMAL HIGH (ref 4.0–10.5)
nRBC: 0.2 % (ref 0.0–0.2)

## 2021-10-07 LAB — GLUCOSE, CAPILLARY
Glucose-Capillary: 88 mg/dL (ref 70–99)
Glucose-Capillary: 231 mg/dL — ABNORMAL HIGH (ref 70–99)
Glucose-Capillary: 176 mg/dL — ABNORMAL HIGH (ref 70–99)
Glucose-Capillary: 197 mg/dL — ABNORMAL HIGH (ref 70–99)

## 2021-10-07 LAB — MAGNESIUM: Magnesium: 2 mg/dL (ref 1.7–2.4)

## 2021-10-07 MED ORDER — DOXYCYCLINE HYCLATE 100 MG PO TABS: 100.0000 mg | ORAL_TABLET | Freq: Two times a day (BID) | ORAL | Status: DC

## 2021-10-07 MED ORDER — DOXYCYCLINE HYCLATE 100 MG PO TABS
100.0000 mg | ORAL_TABLET | Freq: Two times a day (BID) | ORAL | Status: DC
  Administered 2021-10-07: 100 mg via ORAL
  Filled 2021-10-07 (×2): qty 1

## 2021-10-07 NOTE — Plan of Care (Signed)
  Problem: Fluid Volume: Goal: Hemodynamic stability will improve Outcome: Progressing   

## 2021-10-07 NOTE — Progress Notes (Signed)
[facility] Progress Note  Patient: Mallory Butler     VEL:[id number]  DOA: 10/01/2021   PCP: Greig Right, MD       Brief hospital course: 71 y/o female with CAD s/p PCI, IDDM, HTn, HLD, chronic combined systolic and diastolic CHF (EF 25-85% 2/77/82) who presented to the ED for a right buttock infection that had grown from a small nodule noted 5 days prior. She also admits to diarrhea about 4-5 episodes a day for about 3 days. No nausea or vomiting.   WBC 17.9, Procalcitonin 12.27, Lactic acid 3.9 Na 131, Bun 58, Cr 2.02 (baseline 0.79) CO2 21 Glucose 465, BHBA 2.33 A1c 10.4  CT pelvis w/o contrast> cellulitis and developing phlegmon in the right buttock subcutaneous fat, edema within the right gluteus maximus concerning for infectious myositis.  9/30- noted to have rising Cr and A-fib RVR, Given NS bolus and then IV Lopressor- she developed shock and was transferred to the ICU- started on Amiodarone and Phenylephrine  - transitioned to Midodrine only - transferred out of ICU on 10/3  Subjective:   No significant events overnight, she remains afebrile, still complaining of significant pain in right buttocks area.  Assessment and Plan:    Septic shock due to cellulitis of right buttock ([place]) -She remains with significant pain, but by reviewing pictures right this appears to be improving, leukocytosis trending down . -Initially on vancomycin, Flagyl and cefepime on 10/2 to Rocephin . - general surgery asked to evaluate patient- no need for intervention      Acute renal failure superimposed on stage 3b chronic kidney disease (HCC) Chronic combined systolic and diastolic CHF (congestive heart failure) (HCC) Hyponatremia  - due to diarrhea and diuretics - Holding Entresto and Spironolactone  - Cr 2.02> 2.79> Cr 4.23 today  Metabolic acidosis with anion gap - due to AKI and diarrhea - resolved    DKA with Insulin dependent type 2 diabetes mellitus ([place]) Severely  uncontrolled CBGs - we spoke extensively about controlling her sugars - she takes 75/25 35 U in AM - Based on her evening glucose checks, sometimes skips the evening dose because her sugars sometimes tends to drop to 60s in the middle of the night - also takes Glipizide and Metformin - A1c 10.4 - she is interested in trying long acting insulin and mealtime Novolog - cont Semglee at 46 U BID and Novolog 5 U with meals - dc SSI   Diarrhea (resolved)- now constipated - does not seem infectious- resolved and now constipated - Dulcolax suppository once   Hypotension with h/o Hypertension  - SBP ~ 90-100 when admitted - Bisoprolol on hold  A- fib with RVR, Troponin elevation  - Trop max 130 due to demand ischemia - converted to NSR - CHA2DS2-VASc Score 4 - Eliquis  - ASA & Brillinta > Plavix   CAD/ Hyperlipidemia  - stenting of prox LAD in 02/06/21 when she had an NSTEMI - Zetia - Brillinta, ASA (DTAP x 1 yr) changed to Plavix    Hypothyroidism Synthroid  Morbid obesity Body mass index is 43.31 kg/m.   Disposition: SNF- patient in agreement     Code Status: Full Code DVT prophylaxis:   apixaban (ELIQUIS) tablet 5 mg   Consultants: none Level of Care: Level of care: Progressive  Objective:   Vitals:   10/06/21 1945 10/07/21 0500 10/07/21 1132 10/07/21 1200  BP: 123/67  128/61   Pulse: 66   66  Resp: 15   17  Temp:  98.5 F (36.9 C)   98.7 F (37.1 C)  TempSrc: Oral   Oral  SpO2: 98%     Weight:  110.9 kg    Height:       Filed Weights   10/04/21 0500 10/05/21 0500 10/07/21 0500  Weight: 106.5 kg 107 kg 110.9 kg   Exam:  Awake Alert, Oriented X 3, No new F.N deficits, Normal affect Symmetrical Chest wall movement, Good air movement bilaterally, CTAB RRR,No Gallops,Rubs or new Murmurs, No Parasternal Heave +ve B.Sounds, Abd Soft, No tenderness, No rebound - guarding or rigidity. No Cyanosis, Clubbing or edema, No new Rash or bruise     Imaging and lab  data was personally reviewed    CBC: Recent Labs  Lab 10/01/21 1945 10/02/21 0420 10/03/21 0955 10/04/21 0735 10/05/21 0956 10/06/21 0357 10/07/21 0445  WBC 17.9*   < > 12.4* 15.6* 13.5* 11.5* 12.4*  NEUTROABS 14.3*  --   --   --   --   --   --   HGB 11.6*   < > 11.5* 11.1* 9.8* 9.1* 9.9*  HCT 35.1*   < > 34.5* 33.9* 30.2* 27.7* 30.2*  MCV 98.0   < > 95.6 96.9 98.4 96.2 99.3  PLT 296   < > 330 379 343 343 338   < > = values in this interval not displayed.   Basic Metabolic Panel: Recent Labs  Lab 10/03/21 0955 10/03/21 1140 10/04/21 0735 10/05/21 0956 10/06/21 0357 10/07/21 0445  NA 130*  --  136 139 140 145  K 3.9  --  3.5 3.3* 3.4* 5.4*  CL 96*  --  103 106 110 109  CO2 19*  --  21* 23 23 24   GLUCOSE 373*  --  225* 144* 86 124*  BUN 83*  --  63* 40* 33* 24*  CREATININE 2.79*  --  1.46* 1.07* 0.99 0.94  CALCIUM 7.9*  --  8.4* 8.3* 8.2* 9.0  MG  --  2.1 2.0  --   --  2.0  PHOS  --   --  2.3* 2.1* 2.3*  --    GFR: Estimated Creatinine Clearance: 66.6 mL/min (by C-G formula based on SCr of 0.94 mg/dL).  Scheduled Meds:  acetaminophen  1,000 mg Oral TID   apixaban  5 mg Oral BID   atorvastatin  40 mg Oral Daily   Chlorhexidine Gluconate Cloth  6 each Topical Daily   clopidogrel  75 mg Oral Daily   ezetimibe  10 mg Oral Daily   insulin aspart  5 Units Subcutaneous TID WC   insulin glargine-yfgn  46 Units Subcutaneous BID   levothyroxine  88 mcg Oral Q0600   midodrine  10 mg Oral TID WC   sodium chloride flush  3 mL Intravenous Q12H   venlafaxine XR  150 mg Oral Q breakfast   Continuous Infusions:  sodium chloride Stopped (10/04/21 2342)   cefTRIAXone (ROCEPHIN)  IV 2 g (10/07/21 1036)   lactated ringers       LOS: 6 days   Author: Phillips Climes MD 10/07/2021 2:54 PM

## 2021-10-07 NOTE — Care Management Important Message (Signed)
Important Message  Patient Details  Name: Mallory Butler MRN: [id number] Date of Birth: April 18, 1950   Medicare Important Message Given:  Yes     [name] 10/07/2021, 3:13 PM

## 2021-10-07 NOTE — TOC Progression Note (Signed)
Transition of Care [facility]) - Progression Note    Patient Details  Name: Mallory Butler MRN: [id number] Date of Birth: Mar 19, 1950  Transition of Care [facility]) CM/SW [place], LCSW Phone Number: 10/07/2021, 8:49 AM  Clinical Narrative:    CSW submitted insurance authorization process, Ref# [id number], pending a facility choice. CSW will present bed offers to patient.    Expected Discharge Plan: [place] Barriers to Discharge: Continued Medical Work up  Expected Discharge Plan and Services Expected Discharge Plan: [place] In-house Referral: Clinical Social Work     Living arrangements for the past 2 months: Single Family Home                                       Social Determinants of Health (SDOH) Interventions    Readmission Risk Interventions     No data to display

## 2021-10-07 NOTE — Progress Notes (Signed)
Physical Therapy Treatment Patient Details Name: Mallory Butler MRN: [id number] DOB: [birth year] Today's Date: 10/07/2021   History of Present Illness 71 yo female admitted 9/28 with Rt buttock cellulitis. Afib and hypotension prompted transfer to ICU 9/30. PMhx: CHF, DM, CAD, HTN and HLD    PT Comments    PT focus of session on activity progression, improving pt tolerance, gait training, and LE strengthening to tolerance. Pt continuing to require close guard up to light physical assist during mobility, and is able to ambulate room distance only before fatiguing. Pt complaining of significant buttocks pain throughout session. Will continue to follow, plan remains appropriate.      Recommendations for follow up therapy are one component of a multi-disciplinary discharge planning process, led by the attending physician.  Recommendations may be updated based on patient status, additional functional criteria and insurance authorization.  Follow Up Recommendations  Skilled nursing-short term rehab (<3 hours/day) Can patient physically be transported by private vehicle: Yes   Assistance Recommended at Discharge Intermittent Supervision/Assistance  Patient can return home with the following A little help with walking and/or transfers;A little help with bathing/dressing/bathroom;Assistance with cooking/housework;Assist for transportation   Equipment Recommendations  BSC/3in1    Recommendations for Other Services       Precautions / Restrictions Precautions Precautions: Fall Precaution Comments: watch HR and BP Restrictions Weight Bearing Restrictions: No     Mobility  Bed Mobility Overal bed mobility: Needs Assistance Bed Mobility: Supine to Sit     Supine to sit: Min guard, HOB elevated     General bed mobility comments: min guard with HOB very elevated, increased time.    Transfers Overall transfer level: Needs assistance Equipment used: Rolling walker (2 wheels) Transfers:  Sit to/from Stand Sit to Stand: Min assist           General transfer comment: assist for power up, rise, steady    Ambulation/Gait Ambulation/Gait assistance: Min guard Gait Distance (Feet): 25 Feet Assistive device: Rolling walker (2 wheels) Gait Pattern/deviations: Step-through pattern, Decreased stride length, Trunk flexed, Knee flexed in stance - left, Knee flexed in stance - right Gait velocity: decr     General Gait Details: cues for upright posture, pt noted to keep LEs in knee flexion R>L which pt reports is due to chronic knee problems   Stairs             Wheelchair Mobility    Modified Rankin (Stroke Patients Only)       Balance Overall balance assessment: Needs assistance Sitting-balance support: No upper extremity supported, Feet supported Sitting balance-Leahy Scale: Fair     Standing balance support: Bilateral upper extremity supported, During functional activity Standing balance-Leahy Scale: Poor                              Cognition Arousal/Alertness: Awake/alert Behavior During Therapy: WFL for tasks assessed/performed Overall Cognitive Status: Within Functional Limits for tasks assessed                                          Exercises General Exercises - Lower Extremity Long Arc Quad: AROM, Both, 10 reps, Seated    General Comments General comments (skin integrity, edema, etc.): vss      Pertinent Vitals/Pain Pain Assessment Pain Assessment: Faces Faces Pain Scale: Hurts even more Pain Location: Rt buttock  Pain Descriptors / Indicators: Aching, Guarding Pain Intervention(s): Limited activity within patient's tolerance, Monitored during session, Repositioned    Home Living                          Prior Function            PT Goals (current goals can now be found in the care plan section) Acute Rehab PT Goals Patient Stated Goal: get better PT Goal Formulation: With  patient Time For Goal Achievement: 10/18/21 Potential to Achieve Goals: Good Progress towards PT goals: Progressing toward goals    Frequency    Min 3X/week      PT Plan Current plan remains appropriate    Co-evaluation              AM-PAC PT "6 Clicks" Mobility   Outcome Measure  Help needed turning from your back to your side while in a flat bed without using bedrails?: A Little Help needed moving from lying on your back to sitting on the side of a flat bed without using bedrails?: A Little Help needed moving to and from a bed to a chair (including a wheelchair)?: A Little Help needed standing up from a chair using your arms (e.g., wheelchair or bedside chair)?: A Little Help needed to walk in hospital room?: A Little Help needed climbing 3-5 steps with a railing? : A Lot 6 Click Score: 17    End of Session   Activity Tolerance: Patient tolerated treatment well Patient left: in chair;with call bell/phone within reach;with nursing/sitter in room;with chair alarm set Nurse Communication: Mobility status PT Visit Diagnosis: Unsteadiness on feet (R26.81);Other abnormalities of gait and mobility (R26.89);Muscle weakness (generalized) (M62.81);Pain Pain - Right/Left: Right Pain - part of body:  (buttock)     Time: [id number] PT Time Calculation (min) (ACUTE ONLY): 19 min  Charges:  $Therapeutic Activity: 8-22 mins                     Stacie Glaze, PT DPT Acute Rehabilitation Services Pager [phone]  Office [phone]   [place] E [place] 10/07/2021, 1:40 PM

## 2021-10-08 ENCOUNTER — Other Ambulatory Visit: Payer: Medicare Other

## 2021-10-08 ENCOUNTER — Inpatient Hospital Stay (HOSPITAL_COMMUNITY): Payer: Medicare Other

## 2021-10-08 LAB — BASIC METABOLIC PANEL
Anion gap: 9 (ref 5–15)
BUN: 20 mg/dL (ref 8–23)
CO2: 24 mmol/L (ref 22–32)
Calcium: 8.5 mg/dL — ABNORMAL LOW (ref 8.9–10.3)
Chloride: 105 mmol/L (ref 98–111)
Creatinine, Ser: 0.89 mg/dL (ref 0.44–1.00)
GFR, Estimated: >60 mL/min (ref >60)
Glucose, Bld: 88 mg/dL (ref 70–99)
Potassium: 4.4 mmol/L (ref 3.5–5.1)
Sodium: 138 mmol/L (ref 135–145)

## 2021-10-08 LAB — GLUCOSE, CAPILLARY
Glucose-Capillary: 119 mg/dL — ABNORMAL HIGH (ref 70–99)
Glucose-Capillary: 140 mg/dL — ABNORMAL HIGH (ref 70–99)
Glucose-Capillary: 142 mg/dL — ABNORMAL HIGH (ref 70–99)
Glucose-Capillary: 152 mg/dL — ABNORMAL HIGH (ref 70–99)
Glucose-Capillary: 176 mg/dL — ABNORMAL HIGH (ref 70–99)
Glucose-Capillary: 59 mg/dL — ABNORMAL LOW (ref 70–99)
Glucose-Capillary: 69 mg/dL — ABNORMAL LOW (ref 70–99)

## 2021-10-08 LAB — CBC
HCT: 32.8 % — ABNORMAL LOW (ref 36.0–46.0)
Hemoglobin: 10.4 g/dL — ABNORMAL LOW (ref 12.0–15.0)
MCH: 31.7 pg (ref 26.0–34.0)
MCHC: 31.7 g/dL (ref 30.0–36.0)
MCV: 100 fL (ref 80.0–100.0)
Platelets: 425 10*3/uL — ABNORMAL HIGH (ref 150–400)
RBC: 3.28 MIL/uL — ABNORMAL LOW (ref 3.87–5.11)
RDW: 14.2 % (ref 11.5–15.5)
WBC: 14.3 10*3/uL — ABNORMAL HIGH (ref 4.0–10.5)
nRBC: 0.4 % — ABNORMAL HIGH (ref 0.0–0.2)

## 2021-10-08 LAB — PROCALCITONIN: Procalcitonin: 0.47 ng/mL

## 2021-10-08 MED ORDER — HYDROMORPHONE HCL 1 MG/ML IJ SOLN
0.5000 mg | INTRAMUSCULAR | Status: DC | PRN
  Administered 2021-10-09 – 2021-10-10 (×5): 0.5 mg via INTRAVENOUS
  Filled 2021-10-08 (×6): qty 0.5

## 2021-10-08 MED ORDER — INSULIN GLARGINE-YFGN 100 UNIT/ML ~~LOC~~ SOLN
35.0000 [IU] | Freq: Two times a day (BID) | SUBCUTANEOUS | Status: DC
  Administered 2021-10-08 – 2021-10-10 (×5): 35 [IU] via SUBCUTANEOUS
  Filled 2021-10-08 (×6): qty 0.35

## 2021-10-08 MED ORDER — INSULIN ASPART 100 UNIT/ML IJ SOLN: 0.0000 [IU] | Freq: Every day | INTRAMUSCULAR | Status: DC

## 2021-10-08 MED ORDER — INSULIN ASPART 100 UNIT/ML IJ SOLN
0.0000 [IU] | Freq: Three times a day (TID) | INTRAMUSCULAR | Status: DC
  Administered 2021-10-08 – 2021-10-09 (×2): 2 [IU] via SUBCUTANEOUS
  Administered 2021-10-09: 3 [IU] via SUBCUTANEOUS
  Administered 2021-10-10: 5 [IU] via SUBCUTANEOUS

## 2021-10-08 NOTE — TOC Progression Note (Signed)
Transition of Care [facility]) - Progression Note    Patient Details  Name: Mallory Butler MRN: [id number] Date of Birth: 24-Sep-1950  [facility]) CM/SW [place], LCSW Phone Number: 10/08/2021, 11:13 AM  Clinical Narrative:    CSW spoke with patient and provided bed offers. She has selected [facility]). Will update insurance but not currently medially ready. She reported she will try to find a ride at discharge to avoid an ambulance bill.    Expected Discharge Plan: [place] Barriers to Discharge: Continued Medical Work up  Expected Discharge Plan and Services Expected Discharge Plan: [place] In-house Referral: Clinical Social Work     Living arrangements for the past 2 months: Single Family Home                                       Social Determinants of Health (SDOH) Interventions    Readmission Risk Interventions     No data to display

## 2021-10-08 NOTE — Progress Notes (Signed)
[facility] Progress Note  Patient: Mallory Butler     TGG:[id number]  DOA: 10/01/2021   PCP: Greig Right, MD       Brief hospital course: 71 y/o female with CAD s/p PCI, IDDM, HTn, HLD, chronic combined systolic and diastolic CHF (EF 70-35% 0/09/38) who presented to the ED for a right buttock infection that had grown from a small nodule noted 5 days prior. She also admits to diarrhea about 4-5 episodes a day for about 3 days. No nausea or vomiting.   WBC 17.9, Procalcitonin 12.27, Lactic acid 3.9 Na 131, Bun 58, Cr 2.02 (baseline 0.79) CO2 21 Glucose 465, BHBA 2.33 A1c 10.4  CT pelvis w/o contrast> cellulitis and developing phlegmon in the right buttock subcutaneous fat, edema within the right gluteus maximus concerning for infectious myositis.  9/30- noted to have rising Cr and A-fib RVR, Given NS bolus and then IV Lopressor- she developed shock and was transferred to the ICU- started on Amiodarone and Phenylephrine  - transitioned to Midodrine only - transferred out of ICU on 10/3  Subjective:   She still complaining of significant pain in the right buttock area, she had hypoglycemia this a.m.  Assessment and Plan:    Septic shock due to cellulitis of right buttock ([place]) -She remains with significant pain, but by reviewing pictures right this appears to be improving, leukocytosis trending down . -Initially on vancomycin, Flagyl and cefepime on 10/2 to Rocephin . - general surgery asked to evaluate patient- no need for intervention -Still complaining of significant pain, repeat CT pelvis with remained with no significant change in her cellulitis, but no fluid collection or an abscess is present-continue with Septra and IV Rocephin -[facility] wound care consult to evaluate if midline eschar is appropriate for hydrotherapy -Pain remains significant, will add IV Dilaudid as needed -Procalcitonin trending down which is reassuring      Acute renal failure superimposed on  stage 3b chronic kidney disease ([place]) Chronic combined systolic and diastolic CHF (congestive heart failure) (HCC) Hyponatremia  - due to diarrhea and diuretics - Holding Entresto and Spironolactone  - Cr 2.02> 2.79> Cr 1.82 today  Metabolic acidosis with anion gap - due to AKI and diarrhea - resolved    DKA with Insulin dependent type 2 diabetes mellitus ([place]) Severely uncontrolled CBGs - we spoke extensively about controlling her sugars - she takes 75/25 35 U in AM - Based on her evening glucose checks, sometimes skips the evening dose because her sugars sometimes tends to drop to 60s in the middle of the night - also takes Glipizide and Metformin - A1c 10.4 -Low CBGs this morning, will decrease her Semglee to 35 units, and will DC Premeal NovoLog and change to insulin sliding scale.   Diarrhea (resolved)- now constipated - does not seem infectious- resolved and now constipated - Dulcolax suppository once   Hypotension with h/o Hypertension  - SBP ~ 90-100 when admitted - Bisoprolol on hold  A- fib with RVR, Troponin elevation  - Trop max 130 due to demand ischemia - converted to NSR - CHA2DS2-VASc Score 4 - Eliquis  - ASA & Brillinta > Plavix   CAD/ Hyperlipidemia  - stenting of prox LAD in 02/06/21 when she had an NSTEMI - Zetia - Brillinta, ASA (DTAP x 1 yr) changed to Plavix    Hypothyroidism Synthroid  Morbid obesity Body mass index is 43.23 kg/m.   Disposition: SNF- patient in agreement     Code Status: Full Code DVT prophylaxis:  apixaban (ELIQUIS) tablet 5 mg   Consultants: none Level of Care: Level of care: Progressive  Objective:   Vitals:   10/08/21 0450 10/08/21 0458 10/08/21 0810 10/08/21 1200  BP: (!) 164/85  (!) 175/59 (!) 155/61  Pulse: 75  84 70  Resp: 16  20 17   Temp:   98.2 F (36.8 C) 98 F (36.7 C)  TempSrc:   Oral Oral  SpO2: 96%  97% 98%  Weight:  110.7 kg    Height:       Filed Weights   10/05/21 0500 10/07/21 0500  10/08/21 0458  Weight: 107 kg 110.9 kg 110.7 kg   Exam:  Awake Alert, Oriented X 3, No new F.N deficits, Normal affect Symmetrical Chest wall movement, Good air movement bilaterally, CTAB RRR,No Gallops,Rubs or new Murmurs, No Parasternal Heave +ve B.Sounds, Abd Soft, No tenderness, No rebound - guarding or rigidity. No Cyanosis, mains with significant tenderness to palpation in cellulitis in the right buttocks area, with midline eschar, but erythema appears to be improving, please see picture below        Imaging and lab data was personally reviewed    CBC: Recent Labs  Lab 10/01/21 1945 10/02/21 0420 10/04/21 0735 10/05/21 0956 10/06/21 0357 10/07/21 0445 10/08/21 0411  WBC 17.9*   < > 15.6* 13.5* 11.5* 12.4* 14.3*  NEUTROABS 14.3*  --   --   --   --   --   --   HGB 11.6*   < > 11.1* 9.8* 9.1* 9.9* 10.4*  HCT 35.1*   < > 33.9* 30.2* 27.7* 30.2* 32.8*  MCV 98.0   < > 96.9 98.4 96.2 99.3 100.0  PLT 296   < > 379 343 343 338 425*   < > = values in this interval not displayed.   Basic Metabolic Panel: Recent Labs  Lab 10/03/21 1140 10/04/21 0735 10/05/21 0956 10/06/21 0357 10/07/21 0445 10/08/21 0411  NA  --  136 139 140 145 138  K  --  3.5 3.3* 3.4* 5.4* 4.4  CL  --  103 106 110 109 105  CO2  --  21* 23 23 24 24   GLUCOSE  --  225* 144* 86 124* 88  BUN  --  63* 40* 33* 24* 20  CREATININE  --  1.46* 1.07* 0.99 0.94 0.89  CALCIUM  --  8.4* 8.3* 8.2* 9.0 8.5*  MG 2.1 2.0  --   --  2.0  --   PHOS  --  2.3* 2.1* 2.3*  --   --    GFR: Estimated Creatinine Clearance: 70.3 mL/min (by C-G formula based on SCr of 0.89 mg/dL).  Scheduled Meds:  acetaminophen  1,000 mg Oral TID   apixaban  5 mg Oral BID   atorvastatin  40 mg Oral Daily   Chlorhexidine Gluconate Cloth  6 each Topical Daily   clopidogrel  75 mg Oral Daily   ezetimibe  10 mg Oral Daily   insulin aspart  0-15 Units Subcutaneous TID WC   insulin aspart  0-5 Units Subcutaneous QHS   insulin  glargine-yfgn  35 Units Subcutaneous BID   levothyroxine  88 mcg Oral Q0600   sodium chloride flush  3 mL Intravenous Q12H   venlafaxine XR  150 mg Oral Q breakfast   Continuous Infusions:  sodium chloride Stopped (10/04/21 2342)   cefTRIAXone (ROCEPHIN)  IV 2 g (10/08/21 1033)   lactated ringers       LOS: 7 days  Author: Phillips Climes MD 10/08/2021 3:20 PM

## 2021-10-08 NOTE — Inpatient Diabetes Management (Signed)
Inpatient Diabetes Program Recommendations  AACE/ADA: New Consensus Statement on Inpatient Glycemic Control (2015)  Target Ranges:  Prepandial:   less than 140 mg/dL      Peak postprandial:   less than 180 mg/dL (1-2 hours)      Critically ill patients:  140 - 180 mg/dL   Lab Results  Component Value Date   GLUCAP 69 (L) 10/08/2021   HGBA1C 10.4 (H) 10/02/2021    Review of Glycemic Control  Latest Reference Range & Units 10/07/21 08:53 10/07/21 12:08 10/07/21 15:58 10/07/21 19:37 10/08/21 00:52 10/08/21 08:11 10/08/21 08:47  Glucose-Capillary 70 - 99 mg/dL 88 231 (H) 176 (H) 197 (H) 140 (H) 59 (L) 69 (L)  (H): Data is abnormally high (L): Data is abnormally low  Diabetes history: DM2 Outpatient Diabetes medications:  75/25 35 units BID Glipizide 10 mg BID Metformin 1000 mg BID Farxiga 10 mg QD Current orders for Inpatient glycemic control:  Semglee 46 units BID Novolog 5 units tID  Inpatient Diabetes Program Recommendations:    Hypoglycemia this morning.  Please decrease basal:  Semglee 40 units BID Add Novolog 0-9 units TID   Will continue to follow while inpatient.  Thank you, Mallory Dixon, MSN, [place] Diabetes Coordinator Inpatient Diabetes Program [phone] (team pager from 8a-5p)

## 2021-10-08 NOTE — Plan of Care (Signed)
  Problem: Fluid Volume: Goal: Hemodynamic stability will improve Outcome: Progressing   

## 2021-10-09 LAB — GLUCOSE, CAPILLARY
Glucose-Capillary: 142 mg/dL — ABNORMAL HIGH (ref 70–99)
Glucose-Capillary: 192 mg/dL — ABNORMAL HIGH (ref 70–99)
Glucose-Capillary: 208 mg/dL — ABNORMAL HIGH (ref 70–99)
Glucose-Capillary: 148 mg/dL — ABNORMAL HIGH (ref 70–99)

## 2021-10-09 LAB — PROCALCITONIN: Procalcitonin: 0.21 ng/mL

## 2021-10-09 MED ORDER — OXYCODONE HCL 5 MG PO TABS
10.0000 mg | ORAL_TABLET | ORAL | Status: DC | PRN
  Administered 2021-10-09 – 2021-10-10 (×3): 15 mg via ORAL
  Administered 2021-10-10 (×2): 10 mg via ORAL
  Filled 2021-10-09: qty 3
  Filled 2021-10-09: qty 2
  Filled 2021-10-09 (×2): qty 3
  Filled 2021-10-09: qty 2
  Filled 2021-10-09: qty 3

## 2021-10-09 MED ORDER — MAGNESIUM HYDROXIDE 400 MG/5ML PO SUSP
30.0000 mL | Freq: Once | ORAL | Status: DC
  Filled 2021-10-09: qty 30

## 2021-10-09 MED ORDER — SENNOSIDES-DOCUSATE SODIUM 8.6-50 MG PO TABS
1.0000 | ORAL_TABLET | Freq: Two times a day (BID) | ORAL | Status: DC
  Administered 2021-10-09 – 2021-10-10 (×4): 1 via ORAL
  Filled 2021-10-09 (×4): qty 1

## 2021-10-09 MED ORDER — K PHOS MONO-SOD PHOS DI & MONO 155-852-130 MG PO TABS
500.0000 mg | ORAL_TABLET | Freq: Two times a day (BID) | ORAL | Status: AC
  Administered 2021-10-09 – 2021-10-10 (×2): 500 mg via ORAL
  Filled 2021-10-09 (×2): qty 2

## 2021-10-09 MED ORDER — POLYETHYLENE GLYCOL 3350 17 G PO PACK
17.0000 g | PACK | Freq: Two times a day (BID) | ORAL | Status: AC
  Administered 2021-10-09 – 2021-10-10 (×4): 17 g via ORAL
  Filled 2021-10-09 (×4): qty 1

## 2021-10-09 MED ORDER — DOXYCYCLINE HYCLATE 100 MG PO TABS
100.0000 mg | ORAL_TABLET | Freq: Two times a day (BID) | ORAL | Status: DC
  Administered 2021-10-09 – 2021-10-10 (×4): 100 mg via ORAL
  Filled 2021-10-09 (×4): qty 1

## 2021-10-09 MED ORDER — OXYCODONE HCL 5 MG PO TABS
5.0000 mg | ORAL_TABLET | Freq: Once | ORAL | Status: AC
  Administered 2021-10-09: 5 mg via ORAL
  Filled 2021-10-09: qty 1

## 2021-10-09 NOTE — Progress Notes (Signed)
Physical Therapy Treatment Patient Details Name: Mallory Butler MRN: [id number] DOB: 01/23/1950 Today's Date: 10/09/2021   History of Present Illness 71 yo female admitted 9/28 with Rt buttock cellulitis. Afib and hypotension prompted transfer to ICU 9/30. PMhx: CHF, DM, CAD, HTN and HLD.    PT Comments    Pt received in supine, agreeable to therapy session with encouragement, pt reporting episode of urine incontinence in bed and needed assist for hygiene and agreeable to transfer OOB to chair while awaiting staff assist for showering and bed linen change. Pt c/o pain and requesting pain meds, RN notified. Pt needing minA for gait safety due to poor RW management and quick to fatigue. Geomat cushion in recliner and pillows for pressure relief. Pt continues to benefit from PT services to progress toward functional mobility goals.    Recommendations for follow up therapy are one component of a multi-disciplinary discharge planning process, led by the attending physician.  Recommendations may be updated based on patient status, additional functional criteria and insurance authorization.  Follow Up Recommendations  Skilled nursing-short term rehab (<3 hours/day) Can patient physically be transported by private vehicle: Yes (pending pain levels)   Assistance Recommended at Discharge Intermittent Supervision/Assistance  Patient can return home with the following A little help with walking and/or transfers;A little help with bathing/dressing/bathroom;Assistance with cooking/housework;Assist for transportation   Equipment Recommendations  BSC/3in1    Recommendations for Other Services       Precautions / Restrictions Precautions Precautions: Fall Precaution Comments: watch HR and BP Restrictions Weight Bearing Restrictions: No     Mobility  Bed Mobility Overal bed mobility: Needs Assistance Bed Mobility: Supine to Sit, Sit to Supine     Supine to sit: Min guard, HOB elevated      General bed mobility comments: Min Guard A for safety, pt elevated HOB fully prior to performing    Transfers Overall transfer level: Needs assistance Equipment used: Rolling walker (2 wheels) Transfers: Sit to/from Stand Sit to Stand: Min assist           General transfer comment: Min A for power up into standing, cues for safe UE placement needed    Ambulation/Gait Ambulation/Gait assistance: Min assist Gait Distance (Feet): 30 Feet Assistive device: Rolling walker (2 wheels) Gait Pattern/deviations: Step-through pattern, Decreased stride length, Trunk flexed, Knee flexed in stance - left, Knee flexed in stance - right       General Gait Details: cues for upright posture, pt noted to keep LEs in knee flexion R>L which pt reports is due to chronic knee problems, pt holding RW far advanced of BOS, needed minA to maintain RW proximity and for safety      Balance Overall balance assessment: Needs assistance Sitting-balance support: No upper extremity supported, Feet supported Sitting balance-Leahy Scale: Fair     Standing balance support: Bilateral upper extremity supported, During functional activity Standing balance-Leahy Scale: Poor Standing balance comment: RW for standing                            Cognition Arousal/Alertness: Awake/alert Behavior During Therapy: WFL for tasks assessed/performed Overall Cognitive Status: Within Functional Limits for tasks assessed                                 General Comments: pt anxious to have a shower but decreased insight into safety, pt requesting to remain in  shower alone, PTA explained how this would be unsafe given fall risk and pt agreeable to wait until staff has time to assist her with bathing/showering.        Exercises      General Comments General comments (skin integrity, edema, etc.): pt foam dressing on R bottom may need to be replaced as there is increased moisture at site of  wound      Pertinent Vitals/Pain Pain Assessment Pain Assessment: 0-10 Pain Score: 4  Pain Location: Rt buttock- pt sidelying in recliner and states "it will go up quickly in a few mins" requesting pain meds Pain Descriptors / Indicators: Aching, Guarding, Sharp Pain Intervention(s): Monitored during session, Repositioned, Limited activity within patient's tolerance, Patient requesting pain meds-RN notified (geomat cushion in place on chair, pillow between pt legs in s/l in chair)     PT Goals (current goals can now be found in the care plan section) Acute Rehab PT Goals Patient Stated Goal: get better PT Goal Formulation: With patient Time For Goal Achievement: 10/18/21 Progress towards PT goals: Progressing toward goals    Frequency    Min 3X/week      PT Plan Current plan remains appropriate       AM-PAC PT "6 Clicks" Mobility   Outcome Measure  Help needed turning from your back to your side while in a flat bed without using bedrails?: A Little Help needed moving from lying on your back to sitting on the side of a flat bed without using bedrails?: A Little Help needed moving to and from a bed to a chair (including a wheelchair)?: A Little Help needed standing up from a chair using your arms (e.g., wheelchair or bedside chair)?: A Little Help needed to walk in hospital room?: A Little Help needed climbing 3-5 steps with a railing? : Total (pain limiting) 6 Click Score: 16    End of Session Equipment Utilized During Treatment: Gait belt Activity Tolerance: Patient tolerated treatment well Patient left: in chair;with call bell/phone within reach;with chair alarm set;Other (comment) (geomat cushion in place) Nurse Communication: Mobility status;Other (comment) (pt requesting to take a shower, RN notified, also may need foam dressing) PT Visit Diagnosis: Unsteadiness on feet (R26.81);Other abnormalities of gait and mobility (R26.89);Muscle weakness (generalized)  (M62.81);Pain Pain - Right/Left: Right Pain - part of body:  (bottom)     Time: 5093-2671 PT Time Calculation (min) (ACUTE ONLY): 19 min  Charges:  $Gait Training: 8-22 mins                     Jackline Castilla P., PTA Acute Rehabilitation Services Secure Chat Preferred 9a-5:30pm Office: [phone]    [place] 10/09/2021, 5:58 PM

## 2021-10-09 NOTE — Progress Notes (Signed)
[facility] Progress Note  Patient: Mallory Butler     EHU:[id number]  DOA: 10/01/2021   PCP: Greig Right, MD       Brief hospital course: 71 y/o female with CAD s/p PCI, IDDM, HTn, HLD, chronic combined systolic and diastolic CHF (EF 78-58% 8/50/27) who presented to the ED for a right buttock infection that had grown from a small nodule noted 5 days prior. She also admits to diarrhea about 4-5 episodes a day for about 3 days. No nausea or vomiting.   WBC 17.9, Procalcitonin 12.27, Lactic acid 3.9 Na 131, Bun 58, Cr 2.02 (baseline 0.79) CO2 21 Glucose 465, BHBA 2.33 A1c 10.4  CT pelvis w/o contrast> cellulitis and developing phlegmon in the right buttock subcutaneous fat, edema within the right gluteus maximus concerning for infectious myositis.  9/30- noted to have rising Cr and A-fib RVR, Given NS bolus and then IV Lopressor- she developed shock and was transferred to the ICU- started on Amiodarone and Phenylephrine  - transitioned to Midodrine only - transferred out of ICU on 10/3  Subjective:   Pain is better controlled currently, her oxycodone dose has been increased to 15 mg.  Assessment and Plan:   Septic shock due to cellulitis of right buttock ([place]) -She remains with significant pain, but by reviewing pictures right this appears to be improving, leukocytosis trending down . -Initially on vancomycin, Flagyl and cefepime on 10/2 to Rocephin . - general surgery asked to evaluate patient- no need for intervention -Still complaining of significant pain, repeat CT pelvis with remained with no significant change in her cellulitis, but no fluid collection or an abscess is present-continue with Septra and IV Rocephin -Procalcitonin trending down which is reassuring      Acute renal failure superimposed on stage 3b chronic kidney disease ([place]) Chronic combined systolic and diastolic CHF (congestive heart failure) (HCC) Hyponatremia  - due to diarrhea and  diuretics - Holding Entresto and Spironolactone  - Cr 2.02> 2.79> Cr 7.41 today  Metabolic acidosis with anion gap - due to AKI and diarrhea - resolved    DKA with Insulin dependent type 2 diabetes mellitus ([place]) Severely uncontrolled CBGs - we spoke extensively about controlling her sugars - she takes 75/25 35 U in AM - Based on her evening glucose checks, sometimes skips the evening dose because her sugars sometimes tends to drop to 60s in the middle of the night - also takes Glipizide and Metformin - A1c 10.4 -Low CBGs this morning, will decrease her Semglee to 35 units, and will DC Premeal NovoLog and change to insulin sliding scale.   Diarrhea (resolved)- now constipated - does not seem infectious- resolved and now constipated   Hypophosphatemia -repleted   Hypotension with h/o Hypertension  - SBP ~ 90-100 when admitted - Bisoprolol on hold  A- fib with RVR, Troponin elevation  - Trop max 130 due to demand ischemia - converted to NSR - CHA2DS2-VASc Score 4 - Eliquis  - ASA & Brillinta > Plavix   CAD/ Hyperlipidemia  - stenting of prox LAD in 02/06/21 when she had an NSTEMI - Zetia - Brillinta, ASA (DTAP x 1 yr) changed to Plavix    Hypothyroidism Synthroid  Morbid obesity Body mass index is 43.23 kg/m.   Disposition: SNF- patient in agreement     Code Status: Full Code DVT prophylaxis:   apixaban (ELIQUIS) tablet 5 mg   Consultants: none Level of Care: Level of care: Progressive  Objective:   Vitals:  10/09/21 0145 10/09/21 0500 10/09/21 0902 10/09/21 1200  BP: (!) 163/71  (!) 161/59 115/62  Pulse: 80  91 75  Resp: 19  18 16   Temp: 98.7 F (37.1 C)  98.4 F (36.9 C) 98.9 F (37.2 C)  TempSrc: Oral  Oral Oral  SpO2: 97%     Weight:  110.7 kg    Height:       Filed Weights   10/07/21 0500 10/08/21 0458 10/09/21 0500  Weight: 110.9 kg 110.7 kg 110.7 kg   Exam:  Awake Alert, Oriented X 3, No new F.N deficits, Normal affect Symmetrical  Chest wall movement, Good air movement bilaterally, CTAB RRR,No Gallops,Rubs or new Murmurs, No Parasternal Heave +ve B.Sounds, Abd Soft, No tenderness, No rebound - guarding or rigidity. No Cyanosis, Clubbing or edema, No new Rash or bruise           Imaging and lab data was personally reviewed    CBC: Recent Labs  Lab 10/04/21 0735 10/05/21 0956 10/06/21 0357 10/07/21 0445 10/08/21 0411  WBC 15.6* 13.5* 11.5* 12.4* 14.3*  HGB 11.1* 9.8* 9.1* 9.9* 10.4*  HCT 33.9* 30.2* 27.7* 30.2* 32.8*  MCV 96.9 98.4 96.2 99.3 100.0  PLT 379 343 343 338 [id number]   Basic Metabolic Panel: Recent Labs  Lab 10/03/21 1140 10/04/21 0735 10/05/21 0956 10/06/21 0357 10/07/21 0445 10/08/21 0411  NA  --  136 139 140 145 138  K  --  3.5 3.3* 3.4* 5.4* 4.4  CL  --  103 106 110 109 105  CO2  --  21* 23 23 24 24   GLUCOSE  --  225* 144* 86 124* 88  BUN  --  63* 40* 33* 24* 20  CREATININE  --  1.46* 1.07* 0.99 0.94 0.89  CALCIUM  --  8.4* 8.3* 8.2* 9.0 8.5*  MG 2.1 2.0  --   --  2.0  --   PHOS  --  2.3* 2.1* 2.3*  --   --    GFR: Estimated Creatinine Clearance: 70.3 mL/min (by C-G formula based on SCr of 0.89 mg/dL).  Scheduled Meds:  acetaminophen  1,000 mg Oral TID   apixaban  5 mg Oral BID   atorvastatin  40 mg Oral Daily   Chlorhexidine Gluconate Cloth  6 each Topical Daily   clopidogrel  75 mg Oral Daily   doxycycline  100 mg Oral Q12H   ezetimibe  10 mg Oral Daily   insulin aspart  0-15 Units Subcutaneous TID WC   insulin aspart  0-5 Units Subcutaneous QHS   insulin glargine-yfgn  35 Units Subcutaneous BID   levothyroxine  88 mcg Oral Q0600   polyethylene glycol  17 g Oral BID   senna-docusate  1 tablet Oral BID   sodium chloride flush  3 mL Intravenous Q12H   venlafaxine XR  150 mg Oral Q breakfast   Continuous Infusions:  sodium chloride Stopped (10/04/21 2342)   cefTRIAXone (ROCEPHIN)  IV 2 g (10/09/21 1026)   lactated ringers       LOS: 8 days   Author: Phillips Climes MD 10/09/2021 3:54 PM

## 2021-10-10 LAB — GLUCOSE, CAPILLARY
Glucose-Capillary: 77 mg/dL (ref 70–99)
Glucose-Capillary: 216 mg/dL — ABNORMAL HIGH (ref 70–99)
Glucose-Capillary: 101 mg/dL — ABNORMAL HIGH (ref 70–99)
Glucose-Capillary: 167 mg/dL — ABNORMAL HIGH (ref 70–99)

## 2021-10-10 MED ORDER — SENNA 8.6 MG PO TABS: 2.0000 | ORAL_TABLET | Freq: Every day | ORAL | 0 refills | Status: DC

## 2021-10-10 MED ORDER — DOXYCYCLINE HYCLATE 100 MG PO TABS: 100.0000 mg | ORAL_TABLET | Freq: Two times a day (BID) | ORAL | Status: AC

## 2021-10-10 MED ORDER — CLOPIDOGREL BISULFATE 75 MG PO TABS: 75.0000 mg | ORAL_TABLET | Freq: Every day | ORAL | Status: DC

## 2021-10-10 MED ORDER — INSULIN ASPART 100 UNIT/ML IJ SOLN: 0.0000 [IU] | Freq: Every day | INTRAMUSCULAR | 11 refills | Status: AC

## 2021-10-10 MED ORDER — CEPHALEXIN 500 MG PO CAPS: 500.0000 mg | ORAL_CAPSULE | Freq: Four times a day (QID) | ORAL | Status: DC

## 2021-10-10 MED ORDER — INSULIN GLARGINE-YFGN 100 UNIT/ML ~~LOC~~ SOLN: 35.0000 [IU] | Freq: Two times a day (BID) | SUBCUTANEOUS | 11 refills | Status: AC

## 2021-10-10 MED ORDER — OXYCODONE HCL 10 MG PO TABS: 10.0000 mg | ORAL_TABLET | ORAL | 0 refills | Status: DC | PRN

## 2021-10-10 MED ORDER — CEPHALEXIN 500 MG PO CAPS: 500.0000 mg | ORAL_CAPSULE | Freq: Four times a day (QID) | ORAL | 0 refills | Status: AC

## 2021-10-10 MED ORDER — INSULIN ASPART 100 UNIT/ML IJ SOLN: 0.0000 [IU] | Freq: Three times a day (TID) | INTRAMUSCULAR | 11 refills | Status: AC

## 2021-10-10 MED ORDER — APIXABAN 5 MG PO TABS: 5.0000 mg | ORAL_TABLET | Freq: Two times a day (BID) | ORAL | Status: DC

## 2021-10-10 NOTE — Plan of Care (Signed)

## 2021-10-10 NOTE — TOC Transition Note (Signed)
Transition of Care [facility]) - CM/SW Discharge Note   Patient Details  Name: CYRILLA DURKIN MRN: [id number] Date of Birth: 02-26-1950  [facility] CM/SW Contact:  Emeterio Reeve, LCSW Phone Number: 10/10/2021, 3:18 PM   Clinical Narrative:     Per MD patient ready for DC to . RN, patient, patient's family, and facility notified of DC. Discharge Summary and FL2 sent to facility. DC packet on chart. Insurance Josem Kaufmann has been received.  Ambulance transport requested for patient.    RN to call report to [phone]. Pt will go to room 205.  CSW will sign off for now as social work intervention is no longer needed. Please consult [place] again if new needs arise.   Final next level of care: Skilled Nursing Facility Barriers to Discharge: Barriers Resolved   Patient Goals and CMS Choice Patient states their goals for this hospitalization and ongoing recovery are:: SNF CMS Medicare.gov Compare Post Acute Care list provided to:: Patient    Discharge Placement              Patient chooses bed at: [facility] Patient to be transferred to facility by: [place] Name of family member notified: Pt will notify son Patient and family notified of of transfer: 10/10/21  Discharge Plan and Services In-house Referral: Clinical Social Work                                   Social Determinants of Health (SDOH) Interventions     Readmission Risk Interventions     No data to display          Emeterio Reeve, [place] Social Worker

## 2021-10-10 NOTE — Progress Notes (Signed)
Transport is here to take the patient to [facility] and rehab,report given to EMS as wasn't able to get a hold of staff of transfer facility.

## 2021-10-10 NOTE — Discharge Summary (Signed)
Physician Discharge Summary  GIZZELLE CREDIT [id number] DOB: 04-Mar-1950 DOA: 10/01/2021  PCP: Greig Right, MD  Admit date: 10/01/2021 Discharge date: 10/10/2021  Admitted From: Home Disposition:  SNF  Recommendations for Outpatient Follow-up:  Follow up with PCP in 1-2 weeks Please obtain BMP/CBC in one week    Discharge Condition:Stable CODE STATUS:FULL Diet recommendation: Heart Healthy / Carb Modified   Brief/Interim Summary: 71 y/o female with CAD s/p PCI, IDDM, HTn, HLD, chronic combined systolic and diastolic CHF (EF [id number]) who presented to the ED for a right buttock infection that had grown from a small nodule noted 5 days prior. She also admits to diarrhea about 4-5 episodes a day for about 3 days. No nausea or vomiting.    WBC 17.9, Procalcitonin 12.27, Lactic acid 3.9 Na 131, Bun 58, Cr 2.02 (baseline 0.79) CO2 21 Glucose 465, BHBA 2.33 A1c 10.4   CT pelvis w/o contrast> cellulitis and developing phlegmon in the right buttock subcutaneous fat, edema within the right gluteus maximus concerning for infectious myositis.   9/30- noted to have rising Cr and A-fib RVR, Given NS bolus and then IV Lopressor- she developed shock and was transferred to the ICU- started on Amiodarone and Phenylephrine  - transitioned to Midodrine only - transferred out of ICU on 10/3 -Continues to improve, blood pressure started to increase, midodrine has been discontinued where she is resumed back on her home regiment, -She continues to have significant pain at the buttocks cellulitis area, repeat imaging, procalcitonin and CBC was reassuring.    Septic shock due to cellulitis of right buttock ([place]) -She remains with significant pain, but by reviewing pictures right this appears to be improving, leukocytosis trending down . -Initially on vancomycin, Flagyl and cefepime on 10/2 to Rocephin and doxycycline, she will be discharged on another 5 days of oral Keflex and  doxycycline. -Sepsis has resolved -Cellulitis has significantly improved, leukocytosis has normalized, procalcitonin has normalized, she is afebrile -Repeat imaging was done given significant pain at site, was reassuring no abscess or fluid collection, pain is currently controlled on current dose of oxycodone 10 every 4 as needed, please wean gradually as tolerated      Acute renal failure superimposed on stage 3b chronic kidney disease ([place]) Chronic combined systolic and diastolic CHF (congestive heart failure) (HCC) Hyponatremia  - due to diarrhea and diuretics -This all has resolved, renal function back to baseline, blood pressure started to increase, patient was resumed back on her home regimen   Metabolic acidosis with anion gap - due to AKI and diarrhea - resolved    DKA with Insulin dependent type 2 diabetes mellitus ([place]) Severely uncontrolled CBGs -Patient has been transitioned from 75/25, glipizide and metformin to Semglee and insulin sliding during hospital stay, her A1c was 10.4, she will be discharged on Semglee and insulin sliding scale.   Diarrhea (resolved)- now constipated - does not seem infectious- resolved and now constipated     Hypophosphatemia -repleted    Hypotension with h/o Hypertension  -Pressure has been soft initially requiring pressors, then midodrine, blood pressure is elevated so she is off midodrine with acceptable blood pressure, she will be discharged on bisoprolol in the setting of A-fib on presentation   A- fib with RVR, Troponin elevation  - Trop max 130 due to demand ischemia - converted to NSR - CHA2DS2-VASc Score 4 - Eliquis  - ASA & Brillinta > Plavix    CAD/ Hyperlipidemia  - stenting of prox LAD in 02/06/21 when she  had an NSTEMI - Zetia - Brillinta, ASA (DTAP x 1 yr) changed to Plavix discharge     Hypothyroidism Synthroid  Morbid obesity Body mass index is 43.23 kg/m.     Discharge Diagnoses:  Principal Problem:    Septic shock ([place]) Active Problems:   Cellulitis of right buttock   Acute renal failure superimposed on stage 3b chronic kidney disease (HCC)   Chronic combined systolic and diastolic CHF (congestive heart failure) (HCC)   Insulin dependent type 2 diabetes mellitus ([place])   Hypertension associated with diabetes ([place])   Hyperlipidemia associated with type 2 diabetes mellitus (HCC)   Hypothyroidism   Type 2 diabetes mellitus without complication, with long-term current use of insulin (HCC)   Atherosclerosis of native coronary artery of native heart without angina pectoris   Class 3 severe obesity due to excess calories with serious comorbidity and body mass index (BMI) of 40.0 to 44.9 in adult [facility])    Discharge Instructions  Discharge Instructions     Diet - low sodium heart healthy   Complete by: As directed    Discharge instructions   Complete by: As directed    Get CBC, CMP,    Activity: As tolerated with Full fall precautions use walker/cane & assistance as needed   Disposition SNF   Diet: Heart Healthy /carb modified  For Heart failure patients - Check your Weight same time everyday, if you gain over 2 pounds, or you develop in leg swelling, experience more shortness of breath or chest pain, call your Primary MD immediately. Follow Cardiac Low Salt Diet and 1.5 lit/day fluid restriction.   On your next visit with your primary care physician please Get Medicines reviewed and adjusted.   Please request your Prim.MD to go over all Hospital Tests and Procedure/Radiological results at the follow up, please get all Hospital records sent to your Prim MD by signing hospital release before you go home.   If you experience worsening of your admission symptoms, develop shortness of breath, life threatening emergency, suicidal or homicidal thoughts you must seek medical attention immediately by calling 911 or calling your MD immediately  if symptoms less severe.  You Must read  complete instructions/literature along with all the possible adverse reactions/side effects for all the Medicines you take and that have been prescribed to you. Take any new Medicines after you have completely understood and accpet all the possible adverse reactions/side effects.   Do not drive, operating heavy machinery, perform activities at heights, swimming or participation in water activities or provide baby sitting services if your were admitted for syncope or siezures until you have seen by Primary MD or a Neurologist and advised to do so again.  Do not drive when taking Pain medications.    Do not take more than prescribed Pain, Sleep and Anxiety Medications  Special Instructions: If you have smoked or chewed Tobacco  in the last 2 yrs please stop smoking, stop any regular Alcohol  and or any Recreational drug use.  Wear Seat belts while driving.   Please note  You were cared for by a hospitalist during your hospital stay. If you have any questions about your discharge medications or the care you received while you were in the hospital after you are discharged, you can call the unit and asked to speak with the hospitalist on call if the hospitalist that took care of you is not available. Once you are discharged, your primary care physician will handle any further medical issues. Please  note that NO REFILLS for any discharge medications will be authorized once you are discharged, as it is imperative that you return to your primary care physician (or establish a relationship with a primary care physician if you do not have one) for your aftercare needs so that they can reassess your need for medications and monitor your lab values.   Increase activity slowly   Complete by: As directed    No wound care   Complete by: As directed       Allergies as of 10/10/2021   No Known Allergies      Medication List     STOP taking these medications    aspirin EC 81 MG tablet   Brilinta 90  MG Tabs tablet Generic drug: ticagrelor   glipiZIDE 10 MG tablet Commonly known as: GLUCOTROL   HumaLOG Mix 75/25 KwikPen (75-25) 100 UNIT/ML Kwikpen Generic drug: Insulin Lispro Prot & Lispro   metFORMIN 1000 MG tablet Commonly known as: GLUCOPHAGE   spironolactone 25 MG tablet Commonly known as: ALDACTONE       TAKE these medications    apixaban 5 MG Tabs tablet Commonly known as: ELIQUIS Take 1 tablet (5 mg total) by mouth 2 (two) times daily.   atorvastatin 40 MG tablet Commonly known as: LIPITOR TAKE 1 TABLET BY MOUTH EVERYDAY AT BEDTIME What changed: See the new instructions.   bisoprolol 10 MG tablet Commonly known as: ZEBETA Take 10 mg by mouth daily.   cephALEXin 500 MG capsule Commonly known as: KEFLEX Take 1 capsule (500 mg total) by mouth 4 (four) times daily for 5 days.   clopidogrel 75 MG tablet Commonly known as: PLAVIX Take 1 tablet (75 mg total) by mouth daily. Start taking on: October 11, 2021   doxycycline 100 MG tablet Commonly known as: VIBRA-TABS Take 1 tablet (100 mg total) by mouth every 12 (twelve) hours for 5 days.   ezetimibe 10 MG tablet Commonly known as: ZETIA TAKE 1 TABLET BY MOUTH EVERY DAY   insulin aspart 100 UNIT/ML injection Commonly known as: novoLOG Inject 0-15 Units into the skin 3 (three) times daily with meals.   insulin aspart 100 UNIT/ML injection Commonly known as: novoLOG Inject 0-5 Units into the skin at bedtime.   insulin glargine-yfgn 100 UNIT/ML injection Commonly known as: SEMGLEE Inject 0.35 mLs (35 Units total) into the skin 2 (two) times daily.   levothyroxine 88 MCG tablet Commonly known as: SYNTHROID Take 88 mcg by mouth daily before breakfast.   multivitamin capsule Take 1 capsule by mouth daily.   OneTouch Ultra test strip Generic drug: glucose blood USE TO TEST 3 TIMES DAILY   oxybutynin 15 MG 24 hr tablet Commonly known as: DITROPAN XL Take 15 mg by mouth daily.   Oxycodone HCl 10 MG  Tabs Take 1-1.5 tablets (10-15 mg total) by mouth every 4 (four) hours as needed for moderate pain.   prednisoLONE acetate 1 % ophthalmic suspension Commonly known as: PRED FORTE Place 1 drop into the right eye daily.   sacubitril-valsartan 97-103 MG Commonly known as: ENTRESTO Take 1 tablet by mouth 2 (two) times daily.   senna 8.6 MG Tabs tablet Commonly known as: SENOKOT Take 2 tablets (17.2 mg total) by mouth daily.   venlafaxine XR 150 MG 24 hr capsule Commonly known as: EFFEXOR-XR Take 150 mg by mouth daily with breakfast.        Contact information for after-discharge care     Destination     [facility] .   Service: Skilled Nursing Contact information: [address] [place] [address] [phone]                    No Known Allergies  Consultations: PCCM  General surgery   Procedures/Studies: CT PELVIS WO CONTRAST  Result Date: 10/08/2021 CLINICAL DATA:  Right buttocks infection.  Right hip pain. EXAM: CT PELVIS WITHOUT CONTRAST TECHNIQUE: Multidetector CT imaging of the pelvis was performed following the standard protocol without intravenous contrast. RADIATION DOSE REDUCTION: This exam was performed according to the departmental dose-optimization program which includes automated exposure control, adjustment of the mA and/or kV according to patient size and/or use of iterative reconstruction technique. COMPARISON:  October 01, 2021. FINDINGS: Urinary Tract:  Urinary bladder is unremarkable. Bowel:  Sigmoid diverticulosis without inflammation. Vascular/Lymphatic: No pathologically enlarged lymph nodes. No significant vascular abnormality seen. Reproductive:  No mass or other significant abnormality Other: Large ventral hernia is noted which contains loops of small bowel, but does not result in obstruction. Musculoskeletal: No significant osseous abnormality is noted. There remains a large amount of  abnormal stranding involving the subcutaneous tissues in the right gluteal region and posterior to the right hip concerning for cellulitis and developing phlegmon. This appears to be mildly increased compared to prior exam. No definite fluid collection is seen at this time. Inflammation and edema is also seen involving the right gluteus maximus muscle which is unchanged. IMPRESSION: Continued presence of abnormal stranding in the subcutaneous tissues of the right gluteal region and posterior to the right hip concerning for severe cellulitis and developing phlegmon, which appears to be mildly increased compared to prior exam. No definite fluid collection or abscess is seen at this time. Inflammation and edema is seen involving the right gluteus maximus muscle which is unchanged compared to prior exam. Sigmoid diverticulosis without inflammation. Large ventral hernia is again noted which contains loops of small bowel, but does not result obstruction. Electronically Signed   By: Marijo Conception M.D.   On: 10/08/2021 12:16   ECHOCARDIOGRAM COMPLETE  Result Date: 10/03/2021    ECHOCARDIOGRAM REPORT   Patient Name:   LENE MCKAY Date of Exam: 10/03/2021 Medical Rec #:  [id number]     Height:       63.0 in Accession #:    [id number]    Weight:       230.2 lb Date of Birth:  Aug 26, 1950     BSA:          2.053 m Patient Age:    89 years      BP:           98/60 mmHg Patient Gender: F             HR:           112 bpm. Exam Location:  Inpatient Procedure: 2D Echo, Cardiac Doppler and Color Doppler Indications:    Congestive heart failure  History:        Patient has prior history of Echocardiogram examinations, most                 recent 01/29/2021. CHF, CAD; Risk Factors:Diabetes and                 Dyslipidemia.  Sonographer:    Jefferey Pica Referring Phys: [place]  1. Left ventricular ejection fraction, by estimation, is 60 to 65%. The left ventricle has normal function. The left  ventricle  demonstrates regional wall motion abnormalities (see scoring diagram/findings for description). There is moderate concentric left ventricular hypertrophy. Left ventricular diastolic parameters are indeterminate.  2. Right ventricular systolic function is normal. The right ventricular size is normal. There is normal pulmonary artery systolic pressure. The estimated right ventricular systolic pressure is [id number] mmHg.  3. Left atrial size was moderately dilated.  4. The mitral valve is abnormal. Trivial mitral valve regurgitation.  5. The aortic valve is tricuspid. Aortic valve regurgitation is not visualized. Aortic valve sclerosis/calcification is present, without any evidence of aortic stenosis.  6. The inferior vena cava is normal in size with greater than 50% respiratory variability, suggesting right atrial pressure of 3 mmHg. Comparison(s): Prior images reviewed side by side. Overall LVEF has normalized in the range of 60-65%. Also relative improvement in wall motion abnormalities. FINDINGS  Left Ventricle: Left ventricular ejection fraction, by estimation, is 60 to 65%. The left ventricle has normal function. The left ventricle demonstrates regional wall motion abnormalities. The left ventricular internal cavity size was normal in size. There is moderate concentric left ventricular hypertrophy. Left ventricular diastolic parameters are indeterminate.  LV Wall Scoring: The mid and distal anterior septum and apex are hypokinetic. The entire anterior wall, entire lateral wall, entire inferior wall, basal anteroseptal segment, mid inferoseptal segment, and basal inferoseptal segment are normal. Right Ventricle: The right ventricular size is normal. No increase in right ventricular wall thickness. Right ventricular systolic function is normal. There is normal pulmonary artery systolic pressure. The tricuspid regurgitant velocity is 2.04 m/s, and  with an assumed right atrial pressure of 3 mmHg, the estimated right  ventricular systolic pressure is [id number] mmHg. Left Atrium: Left atrial size was moderately dilated. Right Atrium: Right atrial size was normal in size. Pericardium: There is no evidence of pericardial effusion. Presence of epicardial fat layer. Mitral Valve: The mitral valve is abnormal. There is mild thickening of the mitral valve leaflet(s). There is mild calcification of the mitral valve leaflet(s). Mild mitral annular calcification. Trivial mitral valve regurgitation. Tricuspid Valve: The tricuspid valve is grossly normal. Tricuspid valve regurgitation is mild. Aortic Valve: The aortic valve is tricuspid. There is mild aortic valve annular calcification. Aortic valve regurgitation is not visualized. Aortic valve sclerosis/calcification is present, without any evidence of aortic stenosis. Aortic valve peak gradient measures 8.3 mmHg. Pulmonic Valve: The pulmonic valve was grossly normal. Pulmonic valve regurgitation is trivial. Aorta: The aortic root is normal in size and structure. Venous: The inferior vena cava is normal in size with greater than 50% respiratory variability, suggesting right atrial pressure of 3 mmHg. IAS/Shunts: No atrial level shunt detected by color flow Doppler.  LEFT VENTRICLE PLAX 2D LVIDd:         4.50 cm LVIDs:         2.60 cm LV PW:         1.30 cm LV IVS:        1.30 cm LVOT diam:     2.10 cm LV SV:         70 LV SV Index:   34 LVOT Area:     3.46 cm  RIGHT VENTRICLE             IVC RV Basal diam:  2.60 cm     IVC diam: 1.70 cm RV S prime:     11.50 cm/s TAPSE (M-mode): 2.2 cm LEFT ATRIUM             Index  RIGHT ATRIUM           Index LA diam:        4.40 cm 2.14 cm/m   RA Area:     13.20 cm LA Vol (A2C):   76.1 ml 37.08 ml/m  RA Volume:   28.15 ml  13.71 ml/m LA Vol (A4C):   86.5 ml 42.14 ml/m LA Biplane Vol: 82.9 ml 40.39 ml/m  AORTIC VALVE                 PULMONIC VALVE AV Area (Vmax): 3.00 cm     PV Vmax:       0.77 m/s AV Vmax:        143.80 cm/s  PV Peak grad:  2.3  mmHg AV Peak Grad:   8.3 mmHg LVOT Vmax:      124.50 cm/s LVOT Vmean:     76.950 cm/s LVOT VTI:       0.202 m  AORTA Ao Root diam: 3.10 cm Ao Asc diam:  3.50 cm MITRAL VALVE               TRICUSPID VALVE MV Area (PHT): 6.79 cm    TR Peak grad:   16.6 mmHg MV Decel Time: 112 msec    TR Vmax:        204.00 cm/s MV E velocity: 97.07 cm/s                            SHUNTS                            Systemic VTI:  0.20 m                            Systemic Diam: 2.10 cm Rozann Lesches MD Electronically signed by Rozann Lesches MD Signature Date/Time: 10/03/2021/3:05:32 PM    Final    CT PELVIS WO CONTRAST  Result Date: 10/01/2021 CLINICAL DATA:  Soft tissue infection suspected; right buttock mass EXAM: CT PELVIS WITHOUT CONTRAST TECHNIQUE: Multidetector CT imaging of the pelvis was performed following the standard protocol without intravenous contrast. RADIATION DOSE REDUCTION: This exam was performed according to the departmental dose-optimization program which includes automated exposure control, adjustment of the mA and/or kV according to patient size and/or use of iterative reconstruction technique. COMPARISON:  None Available. FINDINGS: Urinary Tract:  No abnormality visualized. Bowel: Small bowel herniation into a large fat containing ventral abdominal wall hernia. No evidence of obstruction. Colonic diverticulosis without diverticulitis. Vascular/Lymphatic: No pathologically enlarged lymph nodes. No significant vascular abnormality seen. Reproductive:  No mass or other significant abnormality Other:  None. Musculoskeletal: Extensive soft tissue stranding with a few foci of more confluence fluid. No organized fluid collection or abscess. No soft tissue gas. Mild enlargement and edema within the right gluteus maximus muscle. No acute fracture.  No evidence of osteomyelitis. IMPRESSION: Findings compatible with cellulitis and developing phlegmon in the right buttock subcutaneous fat. Edema within the right  gluteus maximus concerning for infectious myositis. No drainable fluid collection. Electronically Signed   By: Placido Sou M.D.   On: 10/01/2021 22:47   DG Chest 2 View  Result Date: 10/01/2021 CLINICAL DATA:  Suspected sepsis EXAM: CHEST - 2 VIEW COMPARISON:  02/01/2021 FINDINGS: The heart size and mediastinal contours are within normal limits. Both lungs are clear. The visualized skeletal structures are unremarkable.  IMPRESSION: No active cardiopulmonary disease. Electronically Signed   By: Rolm Baptise M.D.   On: 10/01/2021 20:35      Subjective: Reports her pain is currently controlled, last BM was 3 days ago.  Discharge Exam: Vitals:   10/10/21 0800 10/10/21 1200  BP: 123/64 133/61  Pulse: 80   Resp: 12 20  Temp: 97.8 F (36.6 C) 97.9 F (36.6 C)  SpO2: 99%    Vitals:   10/09/21 2326 10/10/21 0400 10/10/21 0800 10/10/21 1200  BP: (!) 114/58 123/64 123/64 133/61  Pulse: 75 73 80   Resp: 15 19 12 20   Temp: 98 F (36.7 C) 98.5 F (36.9 C) 97.8 F (36.6 C) 97.9 F (36.6 C)  TempSrc: Oral Oral Oral Oral  SpO2: 98% 96% 99%   Weight:      Height:        General: Pt is alert, awake, not in acute distress Cardiovascular: RRR, S1/S2 +, no rubs, no gallops Respiratory: CTA bilaterally, no wheezing, no rhonchi Abdominal: Soft, NT, ND, bowel sounds + Extremities: no edema, no cyanosis    The results of significant diagnostics from this hospitalization (including imaging, microbiology, ancillary and laboratory) are listed below for reference.     Microbiology: Recent Results (from the past 240 hour(s))  Culture, blood (Routine x 2)     Status: None   Collection Time: 10/01/21  7:30 PM   Specimen: BLOOD RIGHT ARM  Result Value Ref Range Status   Specimen Description BLOOD RIGHT ARM  Final   Special Requests   Final    BOTTLES DRAWN AEROBIC AND ANAEROBIC Blood Culture adequate volume   Culture   Final    NO GROWTH 5 DAYS Performed at [facility], [address], [place], [place] [address]    Report Status 10/06/2021 FINAL  Final  Culture, blood (Routine x 2)     Status: None   Collection Time: 10/01/21  7:45 PM   Specimen: BLOOD LEFT HAND  Result Value Ref Range Status   Specimen Description BLOOD LEFT HAND  Final   Special Requests   Final    BOTTLES DRAWN AEROBIC ONLY Blood Culture adequate volume   Culture   Final    NO GROWTH 5 DAYS Performed at [facility], [place] [address] [place], [place] [address]    Report Status 10/06/2021 FINAL  Final     Labs: BNP (last 3 results) Recent Labs    02/01/21 1205  BNP 0000000*   Basic Metabolic Panel: Recent Labs  Lab 10/04/21 0735 10/05/21 0956 10/06/21 0357 10/07/21 0445 10/08/21 0411  NA 136 139 140 145 138  K 3.5 3.3* 3.4* 5.4* 4.4  CL 103 106 110 109 105  CO2 21* 23 23 24 24   GLUCOSE 225* 144* 86 124* 88  BUN 63* 40* 33* 24* 20  CREATININE 1.46* 1.07* 0.99 0.94 0.89  CALCIUM 8.4* 8.3* 8.2* 9.0 8.5*  MG 2.0  --   --  2.0  --   PHOS 2.3* 2.1* 2.3*  --   --    Liver Function Tests: Recent Labs  Lab 10/04/21 0735 10/05/21 0956 10/06/21 0357  ALBUMIN 1.5* 1.5* <1.5*   No results for input(s): "LIPASE", "AMYLASE" in the last 168 hours. No results for input(s): "AMMONIA" in the last 168 hours. CBC: Recent Labs  Lab 10/04/21 0735 10/05/21 0956 10/06/21 0357 10/07/21 0445 10/08/21 0411  WBC 15.6* 13.5* 11.5* 12.4* 14.3*  HGB 11.1* 9.8* 9.1* 9.9* 10.4*  HCT 33.9* 30.2* 27.7* 30.2* 32.8*  MCV 96.9 98.4 96.2 99.3 100.0  PLT 379 343 343 338 425*   Cardiac Enzymes: No results for input(s): "CKTOTAL", "CKMB", "CKMBINDEX", "TROPONINI" in the last 168 hours. BNP: Invalid input(s): "POCBNP" CBG: Recent Labs  Lab 10/09/21 1145 10/09/21 1533 10/09/21 2128 10/10/21 0915 10/10/21 1238  GLUCAP 192* 208* 148* 77 216*   D-Dimer No results for input(s): "DDIMER" in the last 72 hours. Hgb A1c No results for input(s): "HGBA1C" in the last 72 hours. Lipid  Profile No results for input(s): "CHOL", "HDL", "LDLCALC", "TRIG", "CHOLHDL", "LDLDIRECT" in the last 72 hours. Thyroid function studies No results for input(s): "TSH", "T4TOTAL", "T3FREE", "THYROIDAB" in the last 72 hours.  Invalid input(s): "FREET3" Anemia work up No results for input(s): "VITAMINB12", "FOLATE", "FERRITIN", "TIBC", "IRON", "RETICCTPCT" in the last 72 hours. Urinalysis    Component Value Date/Time   COLORURINE AMBER (A) 10/03/2021 1157   APPEARANCEUR HAZY (A) 10/03/2021 1157   LABSPEC 1.023 10/03/2021 1157   PHURINE 5.0 10/03/2021 1157   GLUCOSEU 50 (A) 10/03/2021 1157   HGBUR NEGATIVE 10/03/2021 1157   BILIRUBINUR NEGATIVE 10/03/2021 1157   KETONESUR 5 (A) 10/03/2021 1157   PROTEINUR NEGATIVE 10/03/2021 1157   NITRITE NEGATIVE 10/03/2021 1157   LEUKOCYTESUR TRACE (A) 10/03/2021 1157   Sepsis Labs Recent Labs  Lab 10/05/21 0956 10/06/21 0357 10/07/21 0445 10/08/21 0411  WBC 13.5* 11.5* 12.4* 14.3*   Microbiology Recent Results (from the past 240 hour(s))  Culture, blood (Routine x 2)     Status: None   Collection Time: 10/01/21  7:30 PM   Specimen: BLOOD RIGHT ARM  Result Value Ref Range Status   Specimen Description BLOOD RIGHT ARM  Final   Special Requests   Final    BOTTLES DRAWN AEROBIC AND ANAEROBIC Blood Culture adequate volume   Culture   Final    NO GROWTH 5 DAYS Performed at [facility], [place] [address] [place], [place] [address]    Report Status 10/06/2021 FINAL  Final  Culture, blood (Routine x 2)     Status: None   Collection Time: 10/01/21  7:45 PM   Specimen: BLOOD LEFT HAND  Result Value Ref Range Status   Specimen Description BLOOD LEFT HAND  Final   Special Requests   Final    BOTTLES DRAWN AEROBIC ONLY Blood Culture adequate volume   Culture   Final    NO GROWTH 5 DAYS Performed at [facility], [place] [address] [place], [place] [address]    Report Status 10/06/2021 FINAL  Final     Time coordinating  discharge: Over 30 minutes  SIGNED:   Phillips Climes, MD  [facility] 10/10/2021, 2:57 PM Pager   If 7PM-7AM, please contact night-coverage [email or web address]

## 2021-10-10 NOTE — Progress Notes (Signed)
Mallory Butler is a 71 y.o. female patient. 1. AKI (acute kidney injury) ([place])   2. Cellulitis of buttock   3. Hyperglycemia   4. Sepsis with acute renal failure without septic shock, due to unspecified organism, unspecified acute renal failure type [facility])    Past Medical History:  Diagnosis Date   CHF (congestive heart failure) (HCC)    Coronary artery disease    COVID-19    Diabetes mellitus without complication ([place])    Hyperlipidemia    NSTEMI (non-ST elevated myocardial infarction) ([place])    Thyroid disease    Current Facility-Administered Medications  Medication Dose Route Frequency Provider Last Rate Last Admin   0.9 %  sodium chloride infusion  250 mL Intravenous Continuous Debbe Odea, MD   Stopped at 10/04/21 2342   acetaminophen (TYLENOL) tablet 1,000 mg  1,000 mg Oral TID Debbe Odea, MD   1,000 mg at 10/10/21 0811   apixaban (ELIQUIS) tablet 5 mg  5 mg Oral BID Icard, Bradley L, DO   5 mg at 10/10/21 0810   atorvastatin (LIPITOR) tablet 40 mg  40 mg Oral Daily Zada Finders R, MD   40 mg at 10/10/21 0810   cefTRIAXone (ROCEPHIN) 2 g in sodium chloride 0.9 % 100 mL IVPB  2 g Intravenous Q24H Icard, Bradley L, DO 200 mL/hr at 10/10/21 0817 2 g at 10/10/21 0817   Chlorhexidine Gluconate Cloth 2 % PADS 6 each  6 each Topical Daily Collene Gobble, MD   6 each at 10/10/21 0811   clopidogrel (PLAVIX) tablet 75 mg  75 mg Oral Daily Icard, Bradley L, DO   75 mg at 10/10/21 0811   doxycycline (VIBRA-TABS) tablet 100 mg  100 mg Oral Q12H Elgergawy, Silver Huguenin, MD   100 mg at 10/10/21 0811   ezetimibe (ZETIA) tablet 10 mg  10 mg Oral Daily Zada Finders R, MD   10 mg at 10/10/21 0811   HYDROmorphone (DILAUDID) injection 0.5 mg  0.5 mg Intravenous Q4H PRN Elgergawy, Silver Huguenin, MD   0.5 mg at 10/10/21 0249   insulin aspart (novoLOG) injection 0-15 Units  0-15 Units Subcutaneous TID WC Elgergawy, Silver Huguenin, MD   5 Units at 10/10/21 1335   insulin aspart (novoLOG) injection 0-5 Units  0-5  Units Subcutaneous QHS Elgergawy, Silver Huguenin, MD       insulin glargine-yfgn (SEMGLEE) injection 35 Units  35 Units Subcutaneous BID Elgergawy, Silver Huguenin, MD   35 Units at 10/10/21 1014   lactated ringers bolus 500 mL  500 mL Intravenous Once Jennelle Human B, NP       levothyroxine (SYNTHROID) tablet 88 mcg  88 mcg Oral Q0600 Lenore Cordia, MD   88 mcg at 10/10/21 0550   magnesium hydroxide (MILK OF MAGNESIA) suspension 30 mL  30 mL Oral Once Elgergawy, Silver Huguenin, MD       melatonin tablet 5 mg  5 mg Oral QHS PRN Cristal Generous, NP   5 mg at 10/09/21 2147   ondansetron (ZOFRAN) tablet 4 mg  4 mg Oral Q6H PRN Lenore Cordia, MD       Or   ondansetron (ZOFRAN) injection 4 mg  4 mg Intravenous Q6H PRN Zada Finders R, MD       oxyCODONE (Oxy IR/ROXICODONE) immediate release tablet 10-15 mg  10-15 mg Oral Q4H PRN Elgergawy, Silver Huguenin, MD   10 mg at 10/10/21 1014   polyethylene glycol (MIRALAX / GLYCOLAX) packet 17 g  17  g Oral BID Elgergawy, Silver Huguenin, MD   17 g at 10/10/21 [id number]   senna-docusate (Senokot-S) tablet 1 tablet  1 tablet Oral BID Elgergawy, Silver Huguenin, MD   1 tablet at 10/10/21 0810   sodium chloride flush (NS) 0.9 % injection 3 mL  3 mL Intravenous Q12H Lenore Cordia, MD   3 mL at 10/10/21 [id number]   venlafaxine XR (EFFEXOR-XR) 24 hr capsule 150 mg  150 mg Oral Q breakfast Lenore Cordia, MD   150 mg at 10/10/21 [id number]   No Known Allergies Principal Problem:   Septic shock ([place]) Active Problems:   Hypertension associated with diabetes ([place])   Hyperlipidemia associated with type 2 diabetes mellitus ([place])   Hypothyroidism   Type 2 diabetes mellitus without complication, with long-term current use of insulin (HCC)   Insulin dependent type 2 diabetes mellitus ([place])   Acute renal failure superimposed on stage 3b chronic kidney disease ([place])   Atherosclerosis of native coronary artery of native heart without angina pectoris   Class 3 severe obesity due to excess calories with serious  comorbidity and body mass index (BMI) of 40.0 to 44.9 in adult [facility])   Cellulitis of right buttock   Chronic combined systolic and diastolic CHF (congestive heart failure) (HCC)  Blood pressure 133/61, pulse 80, temperature 97.9 F (36.6 C), temperature source Oral, resp. rate 20, height 5\' 3"  (1.6 m), weight 110.7 kg, SpO2 99 %.  Subjective Objective: Vital signs: (most recent): Blood pressure 133/61, pulse 80, temperature 97.9 F (36.6 C), temperature source Oral, resp. rate 20, height 5\' 3"  (1.6 m), weight 110.7 kg, SpO2 99 %.    Assessment & Plan  Skyler Dusing A Starlynn Klinkner 10/10/2021   Discharge paperwork reviewed with client at this time. No complaints of pain have been made. Family by the bedside. Iv has been removed. No further requests have been made at this time.

## 2021-10-10 NOTE — Plan of Care (Signed)
  Problem: Education: Goal: Ability to describe self-care measures that may prevent or decrease complications (Diabetes Survival Skills Education) will improve Outcome: Progressing   Problem: Coping: Goal: Ability to adjust to condition or change in health will improve Outcome: Progressing   Problem: Metabolic: Goal: Ability to maintain appropriate glucose levels will improve Outcome: Progressing   Problem: Skin Integrity: Goal: Risk for impaired skin integrity will decrease Outcome: Progressing   Problem: Education: Goal: Knowledge of General Education information will improve Description: Including pain rating scale, medication(s)/side effects and non-pharmacologic comfort measures Outcome: Progressing   Problem: Activity: Goal: Risk for activity intolerance will decrease Outcome: Progressing   Problem: Nutrition: Goal: Adequate nutrition will be maintained Outcome: Progressing

## 2021-10-10 NOTE — Progress Notes (Addendum)
This RN has called [place] multiple times to give report on patient, no answer. This RN called Ebony Hail at [phone], Ebony Hail states she will notify [facility].

## 2021-10-10 NOTE — Plan of Care (Signed)
Problem: Fluid Volume: Goal: Hemodynamic stability will improve 10/10/2021 1502 by Waynette Buttery, RN Outcome: Adequate for Discharge 10/10/2021 0754 by Waynette Buttery, RN Outcome: Progressing   Problem: Clinical Measurements: Goal: Diagnostic test results will improve 10/10/2021 1502 by Waynette Buttery, RN Outcome: Adequate for Discharge 10/10/2021 0754 by Waynette Buttery, RN Outcome: Progressing Goal: Signs and symptoms of infection will decrease 10/10/2021 1502 by Waynette Buttery, RN Outcome: Adequate for Discharge 10/10/2021 0754 by Waynette Buttery, RN Outcome: Progressing   Problem: Respiratory: Goal: Ability to maintain adequate ventilation will improve 10/10/2021 1502 by Waynette Buttery, RN Outcome: Adequate for Discharge 10/10/2021 0754 by Waynette Buttery, RN Outcome: Progressing   Problem: Education: Goal: Ability to describe self-care measures that may prevent or decrease complications (Diabetes Survival Skills Education) will improve 10/10/2021 1502 by Waynette Buttery, RN Outcome: Adequate for Discharge 10/10/2021 0754 by Waynette Buttery, RN Outcome: Progressing Goal: Individualized Educational Video(s) 10/10/2021 1502 by Waynette Buttery, RN Outcome: Adequate for Discharge 10/10/2021 0754 by Waynette Buttery, RN Outcome: Progressing   Problem: Coping: Goal: Ability to adjust to condition or change in health will improve 10/10/2021 1502 by Waynette Buttery, RN Outcome: Adequate for Discharge 10/10/2021 0754 by Waynette Buttery, RN Outcome: Progressing   Problem: Fluid Volume: Goal: Ability to maintain a balanced intake and output will improve 10/10/2021 1502 by Waynette Buttery, RN Outcome: Adequate for Discharge 10/10/2021 0754 by Waynette Buttery, RN Outcome: Progressing   Problem: Health Behavior/Discharge Planning: Goal: Ability to identify and utilize available resources and services will improve 10/10/2021 1502 by Waynette Buttery, RN Outcome: Adequate for Discharge 10/10/2021 0754 by Waynette Buttery, RN Outcome: Progressing Goal: Ability to manage health-related needs will improve 10/10/2021 1502 by Waynette Buttery, RN Outcome: Adequate for Discharge 10/10/2021 0754 by Waynette Buttery, RN Outcome: Progressing   Problem: Metabolic: Goal: Ability to maintain appropriate glucose levels will improve 10/10/2021 1502 by Waynette Buttery, RN Outcome: Adequate for Discharge 10/10/2021 0754 by Waynette Buttery, RN Outcome: Progressing   Problem: Nutritional: Goal: Maintenance of adequate nutrition will improve 10/10/2021 1502 by Waynette Buttery, RN Outcome: Adequate for Discharge 10/10/2021 0754 by Waynette Buttery, RN Outcome: Progressing Goal: Progress toward achieving an optimal weight will improve 10/10/2021 1502 by Waynette Buttery, RN Outcome: Adequate for Discharge 10/10/2021 0754 by Waynette Buttery, RN Outcome: Progressing   Problem: Skin Integrity: Goal: Risk for impaired skin integrity will decrease 10/10/2021 1502 by Waynette Buttery, RN Outcome: Adequate for Discharge 10/10/2021 0754 by Waynette Buttery, RN Outcome: Progressing   Problem: Tissue Perfusion: Goal: Adequacy of tissue perfusion will improve 10/10/2021 1502 by Waynette Buttery, RN Outcome: Adequate for Discharge 10/10/2021 0754 by Waynette Buttery, RN Outcome: Progressing   Problem: Education: Goal: Knowledge of General Education information will improve Description: Including pain rating scale, medication(s)/side effects and non-pharmacologic comfort measures 10/10/2021 1502 by Waynette Buttery, RN Outcome: Adequate for Discharge 10/10/2021 0754 by Waynette Buttery, RN Outcome: Progressing   Problem: Health Behavior/Discharge Planning: Goal: Ability to manage health-related needs will improve 10/10/2021 1502 by Waynette Buttery, RN Outcome: Adequate for Discharge 10/10/2021 0754 by Waynette Buttery, RN Outcome: Progressing   Problem: Clinical Measurements: Goal: Ability to maintain clinical measurements within normal limits will  improve 10/10/2021 1502 by Waynette Buttery, RN Outcome: Adequate for Discharge 10/10/2021 0754 by Waynette Buttery, RN Outcome: Progressing Goal: Will remain free  from infection 10/10/2021 1502 by Vonna Kotyk, RN Outcome: Adequate for Discharge 10/10/2021 0754 by Vonna Kotyk, RN Outcome: Progressing Goal: Diagnostic test results will improve 10/10/2021 1502 by Vonna Kotyk, RN Outcome: Adequate for Discharge 10/10/2021 0754 by Vonna Kotyk, RN Outcome: Progressing Goal: Respiratory complications will improve 10/10/2021 1502 by Vonna Kotyk, RN Outcome: Adequate for Discharge 10/10/2021 0754 by Vonna Kotyk, RN Outcome: Progressing Goal: Cardiovascular complication will be avoided 10/10/2021 1502 by Vonna Kotyk, RN Outcome: Adequate for Discharge 10/10/2021 0754 by Vonna Kotyk, RN Outcome: Progressing   Problem: Activity: Goal: Risk for activity intolerance will decrease 10/10/2021 1502 by Vonna Kotyk, RN Outcome: Adequate for Discharge 10/10/2021 0754 by Vonna Kotyk, RN Outcome: Progressing   Problem: Nutrition: Goal: Adequate nutrition will be maintained 10/10/2021 1502 by Vonna Kotyk, RN Outcome: Adequate for Discharge 10/10/2021 0754 by Vonna Kotyk, RN Outcome: Progressing   Problem: Coping: Goal: Level of anxiety will decrease 10/10/2021 1502 by Vonna Kotyk, RN Outcome: Adequate for Discharge 10/10/2021 0754 by Vonna Kotyk, RN Outcome: Progressing   Problem: Elimination: Goal: Will not experience complications related to bowel motility 10/10/2021 1502 by Vonna Kotyk, RN Outcome: Adequate for Discharge 10/10/2021 0754 by Vonna Kotyk, RN Outcome: Progressing Goal: Will not experience complications related to urinary retention 10/10/2021 1502 by Vonna Kotyk, RN Outcome: Adequate for Discharge 10/10/2021 0754 by Vonna Kotyk, RN Outcome: Progressing   Problem: Pain Managment: Goal: General experience of comfort will improve 10/10/2021 1502 by Vonna Kotyk, RN Outcome: Adequate for Discharge 10/10/2021 0754 by Vonna Kotyk, RN Outcome: Progressing   Problem: Safety: Goal: Ability to remain free from injury will improve 10/10/2021 1502 by Vonna Kotyk, RN Outcome: Adequate for Discharge 10/10/2021 0754 by Vonna Kotyk, RN Outcome: Progressing   Problem: Skin Integrity: Goal: Risk for impaired skin integrity will decrease 10/10/2021 1502 by Vonna Kotyk, RN Outcome: Adequate for Discharge 10/10/2021 0754 by Vonna Kotyk, RN Outcome: Progressing

## 2021-10-10 NOTE — Progress Notes (Signed)
Tried to call [facility] care and rehab to give report on patient,no answer.Left voice message to [place] at [phone],MEB no reply .

## 2021-10-15 ENCOUNTER — Ambulatory Visit: Payer: Medicare Other | Admitting: Cardiology

## 2021-10-17 ENCOUNTER — Other Ambulatory Visit: Payer: Self-pay | Admitting: Cardiology

## 2021-11-11 ENCOUNTER — Other Ambulatory Visit: Payer: Self-pay | Admitting: Cardiology

## 2021-12-07 ENCOUNTER — Other Ambulatory Visit: Payer: Self-pay | Admitting: Cardiology

## 2021-12-07 NOTE — Telephone Encounter (Signed)
Should she be on this?

## 2021-12-07 NOTE — Telephone Encounter (Signed)
Yes ST

## 2022-01-15 ENCOUNTER — Ambulatory Visit: Payer: Medicare Other | Admitting: Podiatry

## 2022-01-22 ENCOUNTER — Ambulatory Visit: Payer: Medicare Other | Admitting: Podiatry

## 2022-01-22 DIAGNOSIS — B351 Tinea unguium: Secondary | ICD-10-CM | POA: Diagnosis not present

## 2022-01-22 DIAGNOSIS — E1142 Type 2 diabetes mellitus with diabetic polyneuropathy: Secondary | ICD-10-CM | POA: Diagnosis not present

## 2022-01-22 DIAGNOSIS — M79674 Pain in right toe(s): Secondary | ICD-10-CM | POA: Diagnosis not present

## 2022-01-22 DIAGNOSIS — M79675 Pain in left toe(s): Secondary | ICD-10-CM

## 2022-01-22 DIAGNOSIS — M792 Neuralgia and neuritis, unspecified: Secondary | ICD-10-CM

## 2022-01-22 DIAGNOSIS — L84 Corns and callosities: Secondary | ICD-10-CM | POA: Diagnosis not present

## 2022-01-22 MED ORDER — GABAPENTIN 300 MG PO CAPS: 300.0000 mg | ORAL_CAPSULE | Freq: Every day | ORAL | 0 refills | Status: DC

## 2022-01-22 NOTE — Progress Notes (Signed)
  Subjective:  Patient ID: Mallory Butler, female    DOB: 10-Apr-1950,  MRN: [id number]  Chief Complaint  Patient presents with   Diabetes   Nail Problem    72 y.o. female presents with the above complaint. History confirmed with patient. Patient presenting with pain related to dystrophic thickened elongated nails. Patient is unable to trim own nails related to nail dystrophy and/or mobility issues. Patient does have a history of T2DM. She does report neuropathic pain, burning and tingling, that keeps her up at night.  Patient does have callus present located at the bilateral plantar 1st MPJ causing pain.   Objective:  Physical Exam: warm, good capillary refill nail exam onychomycosis of the toenails, onycholysis, and dystrophic nails DP pulses palpable, PT pulses palpable, and protective sensation absent. Subjective burning and tingling pain at night.  Left Foot:  Pain with palpation of nails due to elongation and dystrophic growth. Hyperkeratotic lesion sub 1st mpj. nO underlying ulceraiton Right Foot: Pain with palpation of nails due to elongation and dystrophic growth. Hyperkeratotic lesion sub 1st mpj. nO underlying ulceraiton  Assessment:   1. Neuropathic pain   2. Pain due to onychomycosis of toenails of both feet   3. Pre-ulcerative calluses   4. DM type 2 with diabetic peripheral neuropathy ([place])      Plan:  Patient was evaluated and treated and all questions answered.  #Neuropathic pain - Discussed neuropathic pain which I believe the pt is struggling with  - Discussed use of gabapentin 300 mg daily at night. Pt has risk of drowsiness with this  - Pt aware of risks and wishes to try medication for her nerve pain. Will trial for next 3 mo   #Hyperkeratotic lesions/pre ulcerative calluses present bilateral plantar 1st MPJ All symptomatic hyperkeratoses x 2 separate lesions were safely debrided with a sterile #10 blade to patient's level of comfort without incident. We  discussed preventative and palliative care of these lesions including supportive and accommodative shoegear, padding, prefabricated and custom molded accommodative orthoses, use of a pumice stone and lotions/creams daily.  #Onychomycosis with pain  -Nails palliatively debrided as below. -Educated on self-care  Procedure: Nail Debridement Rationale: Pain Type of Debridement: manual, sharp debridement. Instrumentation: Nail nipper, rotary burr. Number of Nails: 10  Return in about 3 months (around 04/23/2022) for RFC.         Everitt Amber, DPM [facility]

## 2022-04-23 ENCOUNTER — Ambulatory Visit (INDEPENDENT_AMBULATORY_CARE_PROVIDER_SITE_OTHER): Payer: Medicare Other | Admitting: Podiatry

## 2022-04-23 DIAGNOSIS — Z91199 Patient's noncompliance with other medical treatment and regimen due to unspecified reason: Secondary | ICD-10-CM

## 2022-04-23 NOTE — Progress Notes (Signed)
Pt was a no show for apt, charge generated 

## 2022-05-06 ENCOUNTER — Other Ambulatory Visit: Payer: Self-pay | Admitting: Cardiology

## 2022-06-21 DIAGNOSIS — E113219 Type 2 diabetes mellitus with mild nonproliferative diabetic retinopathy with macular edema, unspecified eye: Secondary | ICD-10-CM | POA: Diagnosis not present

## 2022-06-21 DIAGNOSIS — E1121 Type 2 diabetes mellitus with diabetic nephropathy: Secondary | ICD-10-CM | POA: Diagnosis not present

## 2022-06-21 DIAGNOSIS — E039 Hypothyroidism, unspecified: Secondary | ICD-10-CM | POA: Diagnosis not present

## 2022-06-21 DIAGNOSIS — I10 Essential (primary) hypertension: Secondary | ICD-10-CM | POA: Diagnosis not present

## 2022-06-21 DIAGNOSIS — E1140 Type 2 diabetes mellitus with diabetic neuropathy, unspecified: Secondary | ICD-10-CM | POA: Diagnosis not present

## 2022-06-21 DIAGNOSIS — E1169 Type 2 diabetes mellitus with other specified complication: Secondary | ICD-10-CM | POA: Diagnosis not present

## 2022-06-21 DIAGNOSIS — Z1231 Encounter for screening mammogram for malignant neoplasm of breast: Secondary | ICD-10-CM | POA: Diagnosis not present

## 2022-06-21 DIAGNOSIS — Z1211 Encounter for screening for malignant neoplasm of colon: Secondary | ICD-10-CM | POA: Diagnosis not present

## 2022-07-04 DIAGNOSIS — Z79899 Other long term (current) drug therapy: Secondary | ICD-10-CM | POA: Diagnosis not present

## 2022-07-08 ENCOUNTER — Other Ambulatory Visit: Payer: Self-pay | Admitting: Cardiology

## 2022-07-10 ENCOUNTER — Inpatient Hospital Stay: Admit: 2022-07-10 | Payer: Medicare Other | Admitting: Pulmonary Disease

## 2022-07-10 ENCOUNTER — Encounter (HOSPITAL_COMMUNITY): Payer: Self-pay

## 2022-07-10 DIAGNOSIS — N201 Calculus of ureter: Secondary | ICD-10-CM | POA: Diagnosis not present

## 2022-07-10 DIAGNOSIS — R935 Abnormal findings on diagnostic imaging of other abdominal regions, including retroperitoneum: Secondary | ICD-10-CM | POA: Diagnosis not present

## 2022-07-10 DIAGNOSIS — Z743 Need for continuous supervision: Secondary | ICD-10-CM | POA: Diagnosis not present

## 2022-07-10 DIAGNOSIS — R404 Transient alteration of awareness: Secondary | ICD-10-CM | POA: Diagnosis not present

## 2022-07-10 DIAGNOSIS — N179 Acute kidney failure, unspecified: Secondary | ICD-10-CM | POA: Diagnosis not present

## 2022-07-10 DIAGNOSIS — I4891 Unspecified atrial fibrillation: Secondary | ICD-10-CM | POA: Diagnosis not present

## 2022-07-10 DIAGNOSIS — N10 Acute tubulo-interstitial nephritis: Secondary | ICD-10-CM | POA: Diagnosis not present

## 2022-07-10 DIAGNOSIS — R339 Retention of urine, unspecified: Secondary | ICD-10-CM | POA: Diagnosis not present

## 2022-07-10 DIAGNOSIS — A419 Sepsis, unspecified organism: Secondary | ICD-10-CM | POA: Diagnosis not present

## 2022-07-10 DIAGNOSIS — N134 Hydroureter: Secondary | ICD-10-CM | POA: Diagnosis not present

## 2022-07-10 DIAGNOSIS — I959 Hypotension, unspecified: Secondary | ICD-10-CM | POA: Diagnosis not present

## 2022-07-10 DIAGNOSIS — E119 Type 2 diabetes mellitus without complications: Secondary | ICD-10-CM | POA: Diagnosis not present

## 2022-07-10 DIAGNOSIS — R9431 Abnormal electrocardiogram [ECG] [EKG]: Secondary | ICD-10-CM | POA: Diagnosis not present

## 2022-07-10 DIAGNOSIS — A4150 Gram-negative sepsis, unspecified: Secondary | ICD-10-CM | POA: Diagnosis not present

## 2022-07-10 DIAGNOSIS — Z7984 Long term (current) use of oral hypoglycemic drugs: Secondary | ICD-10-CM | POA: Diagnosis not present

## 2022-07-10 DIAGNOSIS — Z79899 Other long term (current) drug therapy: Secondary | ICD-10-CM | POA: Diagnosis not present

## 2022-07-10 DIAGNOSIS — R9389 Abnormal findings on diagnostic imaging of other specified body structures: Secondary | ICD-10-CM | POA: Diagnosis not present

## 2022-07-10 DIAGNOSIS — I447 Left bundle-branch block, unspecified: Secondary | ICD-10-CM | POA: Diagnosis not present

## 2022-07-10 DIAGNOSIS — R7881 Bacteremia: Secondary | ICD-10-CM | POA: Diagnosis not present

## 2022-07-10 DIAGNOSIS — I444 Left anterior fascicular block: Secondary | ICD-10-CM | POA: Diagnosis not present

## 2022-07-10 DIAGNOSIS — R531 Weakness: Secondary | ICD-10-CM | POA: Diagnosis not present

## 2022-07-10 DIAGNOSIS — Z7985 Long-term (current) use of injectable non-insulin antidiabetic drugs: Secondary | ICD-10-CM | POA: Diagnosis not present

## 2022-07-10 DIAGNOSIS — Z79891 Long term (current) use of opiate analgesic: Secondary | ICD-10-CM | POA: Diagnosis not present

## 2022-07-10 DIAGNOSIS — Z7982 Long term (current) use of aspirin: Secondary | ICD-10-CM | POA: Diagnosis not present

## 2022-07-10 DIAGNOSIS — Z452 Encounter for adjustment and management of vascular access device: Secondary | ICD-10-CM | POA: Diagnosis not present

## 2022-07-10 DIAGNOSIS — G9341 Metabolic encephalopathy: Secondary | ICD-10-CM | POA: Diagnosis not present

## 2022-07-10 DIAGNOSIS — E785 Hyperlipidemia, unspecified: Secondary | ICD-10-CM | POA: Diagnosis not present

## 2022-07-10 DIAGNOSIS — R0989 Other specified symptoms and signs involving the circulatory and respiratory systems: Secondary | ICD-10-CM | POA: Diagnosis not present

## 2022-07-10 DIAGNOSIS — R000 Tachycardia, unspecified: Secondary | ICD-10-CM | POA: Diagnosis not present

## 2022-07-10 DIAGNOSIS — N1330 Unspecified hydronephrosis: Secondary | ICD-10-CM | POA: Diagnosis not present

## 2022-07-10 DIAGNOSIS — I499 Cardiac arrhythmia, unspecified: Secondary | ICD-10-CM | POA: Diagnosis not present

## 2022-07-10 DIAGNOSIS — R6521 Severe sepsis with septic shock: Secondary | ICD-10-CM | POA: Diagnosis not present

## 2022-07-10 DIAGNOSIS — N209 Urinary calculus, unspecified: Secondary | ICD-10-CM | POA: Diagnosis not present

## 2022-07-10 DIAGNOSIS — R739 Hyperglycemia, unspecified: Secondary | ICD-10-CM | POA: Diagnosis not present

## 2022-07-11 DIAGNOSIS — E785 Hyperlipidemia, unspecified: Secondary | ICD-10-CM | POA: Diagnosis not present

## 2022-07-11 DIAGNOSIS — N179 Acute kidney failure, unspecified: Secondary | ICD-10-CM | POA: Diagnosis not present

## 2022-07-11 DIAGNOSIS — N12 Tubulo-interstitial nephritis, not specified as acute or chronic: Secondary | ICD-10-CM | POA: Diagnosis not present

## 2022-07-11 DIAGNOSIS — E039 Hypothyroidism, unspecified: Secondary | ICD-10-CM | POA: Diagnosis not present

## 2022-07-11 DIAGNOSIS — Z79899 Other long term (current) drug therapy: Secondary | ICD-10-CM | POA: Diagnosis not present

## 2022-07-11 DIAGNOSIS — E119 Type 2 diabetes mellitus without complications: Secondary | ICD-10-CM | POA: Diagnosis not present

## 2022-07-11 DIAGNOSIS — Z7982 Long term (current) use of aspirin: Secondary | ICD-10-CM | POA: Diagnosis not present

## 2022-07-11 DIAGNOSIS — R339 Retention of urine, unspecified: Secondary | ICD-10-CM | POA: Diagnosis not present

## 2022-07-11 DIAGNOSIS — Z79891 Long term (current) use of opiate analgesic: Secondary | ICD-10-CM | POA: Diagnosis not present

## 2022-07-11 DIAGNOSIS — A4150 Gram-negative sepsis, unspecified: Secondary | ICD-10-CM | POA: Diagnosis not present

## 2022-07-11 DIAGNOSIS — Z960 Presence of urogenital implants: Secondary | ICD-10-CM | POA: Diagnosis not present

## 2022-07-11 DIAGNOSIS — R6521 Severe sepsis with septic shock: Secondary | ICD-10-CM | POA: Diagnosis not present

## 2022-07-11 DIAGNOSIS — I4891 Unspecified atrial fibrillation: Secondary | ICD-10-CM | POA: Diagnosis not present

## 2022-07-11 DIAGNOSIS — Z7985 Long-term (current) use of injectable non-insulin antidiabetic drugs: Secondary | ICD-10-CM | POA: Diagnosis not present

## 2022-07-11 DIAGNOSIS — I2510 Atherosclerotic heart disease of native coronary artery without angina pectoris: Secondary | ICD-10-CM | POA: Diagnosis not present

## 2022-07-11 DIAGNOSIS — N10 Acute tubulo-interstitial nephritis: Secondary | ICD-10-CM | POA: Diagnosis not present

## 2022-07-11 DIAGNOSIS — N200 Calculus of kidney: Secondary | ICD-10-CM | POA: Diagnosis not present

## 2022-07-11 DIAGNOSIS — R7881 Bacteremia: Secondary | ICD-10-CM | POA: Diagnosis not present

## 2022-07-11 DIAGNOSIS — I10 Essential (primary) hypertension: Secondary | ICD-10-CM | POA: Diagnosis not present

## 2022-07-11 DIAGNOSIS — R7989 Other specified abnormal findings of blood chemistry: Secondary | ICD-10-CM | POA: Diagnosis not present

## 2022-07-11 DIAGNOSIS — A419 Sepsis, unspecified organism: Secondary | ICD-10-CM | POA: Diagnosis not present

## 2022-07-11 DIAGNOSIS — Z7984 Long term (current) use of oral hypoglycemic drugs: Secondary | ICD-10-CM | POA: Diagnosis not present

## 2022-07-11 DIAGNOSIS — I080 Rheumatic disorders of both mitral and aortic valves: Secondary | ICD-10-CM | POA: Diagnosis not present

## 2022-07-11 DIAGNOSIS — N201 Calculus of ureter: Secondary | ICD-10-CM | POA: Diagnosis not present

## 2022-07-11 DIAGNOSIS — G9341 Metabolic encephalopathy: Secondary | ICD-10-CM | POA: Diagnosis not present

## 2022-07-11 DIAGNOSIS — N1330 Unspecified hydronephrosis: Secondary | ICD-10-CM | POA: Diagnosis not present

## 2022-07-14 DIAGNOSIS — Z79899 Other long term (current) drug therapy: Secondary | ICD-10-CM | POA: Diagnosis not present

## 2022-08-01 DIAGNOSIS — R9389 Abnormal findings on diagnostic imaging of other specified body structures: Secondary | ICD-10-CM | POA: Diagnosis not present

## 2022-08-01 DIAGNOSIS — I700 Atherosclerosis of aorta: Secondary | ICD-10-CM | POA: Diagnosis not present

## 2022-08-01 DIAGNOSIS — R410 Disorientation, unspecified: Secondary | ICD-10-CM | POA: Diagnosis not present

## 2022-08-02 DIAGNOSIS — E785 Hyperlipidemia, unspecified: Secondary | ICD-10-CM | POA: Diagnosis not present

## 2022-08-02 DIAGNOSIS — N179 Acute kidney failure, unspecified: Secondary | ICD-10-CM | POA: Diagnosis not present

## 2022-08-02 DIAGNOSIS — F32A Depression, unspecified: Secondary | ICD-10-CM | POA: Diagnosis not present

## 2022-08-02 DIAGNOSIS — Z7985 Long-term (current) use of injectable non-insulin antidiabetic drugs: Secondary | ICD-10-CM | POA: Diagnosis not present

## 2022-08-02 DIAGNOSIS — Z7984 Long term (current) use of oral hypoglycemic drugs: Secondary | ICD-10-CM | POA: Diagnosis not present

## 2022-08-02 DIAGNOSIS — E1110 Type 2 diabetes mellitus with ketoacidosis without coma: Secondary | ICD-10-CM | POA: Diagnosis not present

## 2022-08-02 DIAGNOSIS — N390 Urinary tract infection, site not specified: Secondary | ICD-10-CM | POA: Diagnosis not present

## 2022-08-02 DIAGNOSIS — Z955 Presence of coronary angioplasty implant and graft: Secondary | ICD-10-CM | POA: Diagnosis not present

## 2022-08-02 DIAGNOSIS — G9341 Metabolic encephalopathy: Secondary | ICD-10-CM | POA: Diagnosis not present

## 2022-08-02 DIAGNOSIS — E1169 Type 2 diabetes mellitus with other specified complication: Secondary | ICD-10-CM | POA: Diagnosis not present

## 2022-08-02 DIAGNOSIS — I4891 Unspecified atrial fibrillation: Secondary | ICD-10-CM | POA: Diagnosis not present

## 2022-08-02 DIAGNOSIS — R9431 Abnormal electrocardiogram [ECG] [EKG]: Secondary | ICD-10-CM | POA: Diagnosis not present

## 2022-08-02 DIAGNOSIS — N1330 Unspecified hydronephrosis: Secondary | ICD-10-CM | POA: Diagnosis not present

## 2022-08-02 DIAGNOSIS — E1010 Type 1 diabetes mellitus with ketoacidosis without coma: Secondary | ICD-10-CM | POA: Diagnosis not present

## 2022-08-02 DIAGNOSIS — N323 Diverticulum of bladder: Secondary | ICD-10-CM | POA: Diagnosis not present

## 2022-08-02 DIAGNOSIS — I444 Left anterior fascicular block: Secondary | ICD-10-CM | POA: Diagnosis not present

## 2022-08-02 DIAGNOSIS — I10 Essential (primary) hypertension: Secondary | ICD-10-CM | POA: Diagnosis not present

## 2022-08-02 DIAGNOSIS — I2510 Atherosclerotic heart disease of native coronary artery without angina pectoris: Secondary | ICD-10-CM | POA: Diagnosis not present

## 2022-08-02 DIAGNOSIS — I480 Paroxysmal atrial fibrillation: Secondary | ICD-10-CM | POA: Diagnosis not present

## 2022-08-02 DIAGNOSIS — I110 Hypertensive heart disease with heart failure: Secondary | ICD-10-CM | POA: Diagnosis not present

## 2022-08-02 DIAGNOSIS — E7800 Pure hypercholesterolemia, unspecified: Secondary | ICD-10-CM | POA: Diagnosis not present

## 2022-08-02 DIAGNOSIS — Z95820 Peripheral vascular angioplasty status with implants and grafts: Secondary | ICD-10-CM | POA: Diagnosis not present

## 2022-08-02 DIAGNOSIS — R9389 Abnormal findings on diagnostic imaging of other specified body structures: Secondary | ICD-10-CM | POA: Diagnosis not present

## 2022-08-02 DIAGNOSIS — Z8616 Personal history of COVID-19: Secondary | ICD-10-CM | POA: Diagnosis not present

## 2022-08-02 DIAGNOSIS — Z83438 Family history of other disorder of lipoprotein metabolism and other lipidemia: Secondary | ICD-10-CM | POA: Diagnosis not present

## 2022-08-02 DIAGNOSIS — Z8249 Family history of ischemic heart disease and other diseases of the circulatory system: Secondary | ICD-10-CM | POA: Diagnosis not present

## 2022-08-02 DIAGNOSIS — I510 Cardiac septal defect, acquired: Secondary | ICD-10-CM | POA: Diagnosis not present

## 2022-08-02 DIAGNOSIS — I252 Old myocardial infarction: Secondary | ICD-10-CM | POA: Diagnosis not present

## 2022-08-02 DIAGNOSIS — E039 Hypothyroidism, unspecified: Secondary | ICD-10-CM | POA: Diagnosis not present

## 2022-08-02 DIAGNOSIS — B9620 Unspecified Escherichia coli [E. coli] as the cause of diseases classified elsewhere: Secondary | ICD-10-CM | POA: Diagnosis not present

## 2022-08-02 DIAGNOSIS — Z7901 Long term (current) use of anticoagulants: Secondary | ICD-10-CM | POA: Diagnosis not present

## 2022-08-02 DIAGNOSIS — N202 Calculus of kidney with calculus of ureter: Secondary | ICD-10-CM | POA: Diagnosis not present

## 2022-08-02 DIAGNOSIS — E781 Pure hyperglyceridemia: Secondary | ICD-10-CM | POA: Diagnosis not present

## 2022-08-02 DIAGNOSIS — Z87440 Personal history of urinary (tract) infections: Secondary | ICD-10-CM | POA: Diagnosis not present

## 2022-08-02 DIAGNOSIS — N3281 Overactive bladder: Secondary | ICD-10-CM | POA: Diagnosis not present

## 2022-08-02 DIAGNOSIS — E872 Acidosis, unspecified: Secondary | ICD-10-CM | POA: Diagnosis not present

## 2022-08-02 DIAGNOSIS — R7989 Other specified abnormal findings of blood chemistry: Secondary | ICD-10-CM | POA: Diagnosis not present

## 2022-08-02 DIAGNOSIS — I214 Non-ST elevation (NSTEMI) myocardial infarction: Secondary | ICD-10-CM | POA: Diagnosis not present

## 2022-08-02 DIAGNOSIS — Z87442 Personal history of urinary calculi: Secondary | ICD-10-CM | POA: Diagnosis not present

## 2022-08-02 DIAGNOSIS — Z7902 Long term (current) use of antithrombotics/antiplatelets: Secondary | ICD-10-CM | POA: Diagnosis not present

## 2022-08-02 DIAGNOSIS — Z7982 Long term (current) use of aspirin: Secondary | ICD-10-CM | POA: Diagnosis not present

## 2022-08-02 DIAGNOSIS — R410 Disorientation, unspecified: Secondary | ICD-10-CM | POA: Diagnosis not present

## 2022-08-02 DIAGNOSIS — Z87891 Personal history of nicotine dependence: Secondary | ICD-10-CM | POA: Diagnosis not present

## 2022-08-02 DIAGNOSIS — E1165 Type 2 diabetes mellitus with hyperglycemia: Secondary | ICD-10-CM | POA: Diagnosis not present

## 2022-08-02 DIAGNOSIS — Z792 Long term (current) use of antibiotics: Secondary | ICD-10-CM | POA: Diagnosis not present

## 2022-08-02 DIAGNOSIS — E669 Obesity, unspecified: Secondary | ICD-10-CM | POA: Diagnosis not present

## 2022-08-02 DIAGNOSIS — Z79890 Hormone replacement therapy (postmenopausal): Secondary | ICD-10-CM | POA: Diagnosis not present

## 2022-08-02 DIAGNOSIS — I700 Atherosclerosis of aorta: Secondary | ICD-10-CM | POA: Diagnosis not present

## 2022-08-02 DIAGNOSIS — I5043 Acute on chronic combined systolic (congestive) and diastolic (congestive) heart failure: Secondary | ICD-10-CM | POA: Diagnosis not present

## 2022-08-02 DIAGNOSIS — Z794 Long term (current) use of insulin: Secondary | ICD-10-CM | POA: Diagnosis not present

## 2022-08-02 DIAGNOSIS — N136 Pyonephrosis: Secondary | ICD-10-CM | POA: Diagnosis not present

## 2022-08-02 DIAGNOSIS — Z79899 Other long term (current) drug therapy: Secondary | ICD-10-CM | POA: Diagnosis not present

## 2022-08-02 DIAGNOSIS — N132 Hydronephrosis with renal and ureteral calculous obstruction: Secondary | ICD-10-CM | POA: Diagnosis not present

## 2022-08-03 ENCOUNTER — Encounter (HOSPITAL_COMMUNITY): Payer: Self-pay

## 2022-08-03 DIAGNOSIS — I10 Essential (primary) hypertension: Secondary | ICD-10-CM | POA: Diagnosis not present

## 2022-08-03 DIAGNOSIS — I214 Non-ST elevation (NSTEMI) myocardial infarction: Secondary | ICD-10-CM | POA: Diagnosis not present

## 2022-08-03 DIAGNOSIS — R7989 Other specified abnormal findings of blood chemistry: Secondary | ICD-10-CM | POA: Diagnosis not present

## 2022-08-03 DIAGNOSIS — E785 Hyperlipidemia, unspecified: Secondary | ICD-10-CM | POA: Diagnosis not present

## 2022-08-04 ENCOUNTER — Inpatient Hospital Stay (HOSPITAL_COMMUNITY)
Admission: EM | Admit: 2022-08-04 | Discharge: 2022-08-07 | DRG: 321 | Disposition: A | Discharge status: Home / Self Care | Payer: Medicare Other | Source: Other Acute Inpatient Hospital | Attending: Internal Medicine | Admitting: Internal Medicine

## 2022-08-04 DIAGNOSIS — Z83438 Family history of other disorder of lipoprotein metabolism and other lipidemia: Secondary | ICD-10-CM

## 2022-08-04 DIAGNOSIS — R7989 Other specified abnormal findings of blood chemistry: Secondary | ICD-10-CM | POA: Diagnosis not present

## 2022-08-04 DIAGNOSIS — Z87891 Personal history of nicotine dependence: Secondary | ICD-10-CM

## 2022-08-04 DIAGNOSIS — I252 Old myocardial infarction: Secondary | ICD-10-CM

## 2022-08-04 DIAGNOSIS — Z79890 Hormone replacement therapy (postmenopausal): Secondary | ICD-10-CM | POA: Diagnosis not present

## 2022-08-04 DIAGNOSIS — N136 Pyonephrosis: Secondary | ICD-10-CM | POA: Diagnosis present

## 2022-08-04 DIAGNOSIS — Z8249 Family history of ischemic heart disease and other diseases of the circulatory system: Secondary | ICD-10-CM

## 2022-08-04 DIAGNOSIS — I2584 Coronary atherosclerosis due to calcified coronary lesion: Secondary | ICD-10-CM | POA: Diagnosis present

## 2022-08-04 DIAGNOSIS — B9620 Unspecified Escherichia coli [E. coli] as the cause of diseases classified elsewhere: Secondary | ICD-10-CM | POA: Diagnosis present

## 2022-08-04 DIAGNOSIS — I10 Essential (primary) hypertension: Secondary | ICD-10-CM

## 2022-08-04 DIAGNOSIS — Z8616 Personal history of COVID-19: Secondary | ICD-10-CM

## 2022-08-04 DIAGNOSIS — Z95820 Peripheral vascular angioplasty status with implants and grafts: Secondary | ICD-10-CM | POA: Diagnosis not present

## 2022-08-04 DIAGNOSIS — N323 Diverticulum of bladder: Secondary | ICD-10-CM | POA: Diagnosis not present

## 2022-08-04 DIAGNOSIS — E1169 Type 2 diabetes mellitus with other specified complication: Secondary | ICD-10-CM | POA: Diagnosis not present

## 2022-08-04 DIAGNOSIS — Z79899 Other long term (current) drug therapy: Secondary | ICD-10-CM | POA: Diagnosis not present

## 2022-08-04 DIAGNOSIS — I2510 Atherosclerotic heart disease of native coronary artery without angina pectoris: Secondary | ICD-10-CM | POA: Diagnosis not present

## 2022-08-04 DIAGNOSIS — I700 Atherosclerosis of aorta: Secondary | ICD-10-CM | POA: Diagnosis not present

## 2022-08-04 DIAGNOSIS — Z7902 Long term (current) use of antithrombotics/antiplatelets: Secondary | ICD-10-CM

## 2022-08-04 DIAGNOSIS — I214 Non-ST elevation (NSTEMI) myocardial infarction: Principal | ICD-10-CM | POA: Diagnosis present

## 2022-08-04 DIAGNOSIS — Z6838 Body mass index (BMI) 38.0-38.9, adult: Secondary | ICD-10-CM

## 2022-08-04 DIAGNOSIS — E1110 Type 2 diabetes mellitus with ketoacidosis without coma: Secondary | ICD-10-CM | POA: Diagnosis present

## 2022-08-04 DIAGNOSIS — Z794 Long term (current) use of insulin: Secondary | ICD-10-CM

## 2022-08-04 DIAGNOSIS — Z7985 Long-term (current) use of injectable non-insulin antidiabetic drugs: Secondary | ICD-10-CM

## 2022-08-04 DIAGNOSIS — Z7901 Long term (current) use of anticoagulants: Secondary | ICD-10-CM | POA: Diagnosis not present

## 2022-08-04 DIAGNOSIS — I25110 Atherosclerotic heart disease of native coronary artery with unstable angina pectoris: Secondary | ICD-10-CM | POA: Diagnosis not present

## 2022-08-04 DIAGNOSIS — I5043 Acute on chronic combined systolic (congestive) and diastolic (congestive) heart failure: Secondary | ICD-10-CM | POA: Diagnosis present

## 2022-08-04 DIAGNOSIS — I110 Hypertensive heart disease with heart failure: Secondary | ICD-10-CM | POA: Diagnosis present

## 2022-08-04 DIAGNOSIS — N202 Calculus of kidney with calculus of ureter: Secondary | ICD-10-CM | POA: Diagnosis present

## 2022-08-04 DIAGNOSIS — E781 Pure hyperglyceridemia: Secondary | ICD-10-CM | POA: Diagnosis not present

## 2022-08-04 DIAGNOSIS — E669 Obesity, unspecified: Secondary | ICD-10-CM | POA: Diagnosis present

## 2022-08-04 DIAGNOSIS — I480 Paroxysmal atrial fibrillation: Secondary | ICD-10-CM | POA: Diagnosis present

## 2022-08-04 DIAGNOSIS — E785 Hyperlipidemia, unspecified: Secondary | ICD-10-CM | POA: Diagnosis present

## 2022-08-04 DIAGNOSIS — Z87442 Personal history of urinary calculi: Secondary | ICD-10-CM

## 2022-08-04 DIAGNOSIS — E119 Type 2 diabetes mellitus without complications: Secondary | ICD-10-CM

## 2022-08-04 DIAGNOSIS — Z955 Presence of coronary angioplasty implant and graft: Secondary | ICD-10-CM | POA: Diagnosis not present

## 2022-08-04 DIAGNOSIS — Z7984 Long term (current) use of oral hypoglycemic drugs: Secondary | ICD-10-CM

## 2022-08-04 DIAGNOSIS — I5042 Chronic combined systolic (congestive) and diastolic (congestive) heart failure: Secondary | ICD-10-CM | POA: Diagnosis present

## 2022-08-04 DIAGNOSIS — E1165 Type 2 diabetes mellitus with hyperglycemia: Secondary | ICD-10-CM | POA: Diagnosis not present

## 2022-08-04 DIAGNOSIS — I2089 Other forms of angina pectoris: Secondary | ICD-10-CM

## 2022-08-04 LAB — GLUCOSE, CAPILLARY: Glucose-Capillary: 194 mg/dL — ABNORMAL HIGH (ref 70–99)

## 2022-08-04 NOTE — H&P (Signed)
History and Physical    Mallory Butler:[id number] DOB: 1950-06-09 DOA: 08/04/2022  PCP: Alinda Deem, MD  Patient coming from: [facility]  I have personally briefly reviewed patient's old medical records in [facility] Link  Chief Complaint: Elevated troponin  HPI: Mallory Butler is a 72 y.o. female with medical history significant for CAD s/p DES to LAD (02/02/2021), HFrEF (EF 30-35% by TTE 07/13/2022), PAF on Eliquis, T2DM, HLD, obstructing left ureteral stone s/p ureteral stent 07/11/2022 who is admitted as a transfer from [facility] for evaluation of elevated troponin.  Patient recently admitted at [facility] in [place] from 07/11/2022-07/15/2022 for septic shock due to pansensitive E. coli bacteremia associated with obstructing left ureteral stone.  Patient underwent ureteral stenting on 7/7 and was treated with IV ceftriaxone in hospital and discharged on ciprofloxacin.  Patient was noted to have HFrEF with EF 30-35%.  She was started on Jardiance and metoprolol, her home Sherryll Burger was continued.  Patient states she began to feel unwell this past Sunday night 7/28.  She was sitting on the porch all day and felt very fatigued.  EMS were called and she was taken to [facility] ED.  Patient was found to be in DKA and admitted for further management. She has been transitioned to subcutaneous insulin. She was also found to have E. coli UTI.  Patient states she did have dysuria which has improved.   Troponin was elevated 0.74>1.9 > 1.28.  Patient denies any chest pain.  Case was discussed with [facility] cardiology Dr. Odis Hollingshead who recommended transfer and hospitalist admission.  [facility] course  Labs/Imaging on admission: I have personally reviewed following labs and imaging studies.  CT urogram 08/02/2022 showed left ureteral stent in place with mild left hydronephrosis.  Nonobstructive left renal calculus.  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  Aortic  atherosclerosis and coronary artery calcifications noted.  Urine culture collected 7/29 grew E. coli.  Troponin trend 0.74 > 1.9 > 1.28.  Transfer requested for cardiology evaluation.  Review of Systems: All systems reviewed and are negative except as documented in history of present illness above.   Past Medical History:  Diagnosis Date   CHF (congestive heart failure) (HCC)    Coronary artery disease    COVID-19    Diabetes mellitus without complication (HCC)    Hyperlipidemia    NSTEMI (non-ST elevated myocardial infarction) (HCC)    Thyroid disease     Past Surgical History:  Procedure Laterality Date   CORONARY STENT INTERVENTION N/A 02/02/2021   Procedure: CORONARY STENT INTERVENTION;  Surgeon: Yates Decamp, MD;  [facility]: [facility] IN[facility];  Service: Cardiovascular;  Laterality: N/A;   KNEE SURGERY Left    RIGHT/LEFT HEART CATH AND CORONARY ANGIOGRAPHY N/A 02/02/2021   Procedure: RIGHT/LEFT HEART CATH AND CORONARY ANGIOGRAPHY;  Surgeon: Yates Decamp, MD;  Location: [facility] INVASIVE CV LAB;  Service: Cardiovascular;  Laterality: N/A;    Social History:  reports that she quit smoking about 52 years ago. Her smoking use included cigarettes. She has never used smokeless tobacco. She reports that she does not currently use alcohol. No history on file for drug use.  No Known Allergies  Family History  Problem Relation Age of Onset   Hyperlipidemia Mother    Cancer Mother    Heart attack Father    Hypertension Father    Heart attack Sister      Prior to Admission medications   Medication Sig Start Date End Date Taking? Authorizing Provider  apixaban (ELIQUIS) 5 MG TABS tablet Take 1 tablet (5 mg total) by mouth 2 (two) times daily. 10/10/21   Elgergawy, Leana Roe, MD  atorvastatin (LIPITOR) 40 MG tablet TAKE 1 TABLET BY MOUTH EVERYDAY AT BEDTIME Patient taking differently: Take 40 mg by mouth daily. 07/20/21   Tolia, Sunit, DO  bisoprolol (ZEBETA) 10 MG tablet Take 10 mg by  mouth daily.    [provider]  clopidogrel (PLAVIX) 75 MG tablet Take 1 tablet (75 mg total) by mouth daily. 10/11/21   Elgergawy, Leana Roe, MD  ENTRESTO 97-103 MG TAKE 1 TABLET BY MOUTH TWICE A DAY 12/07/21   Tolia, Sunit, DO  ezetimibe (ZETIA) 10 MG tablet TAKE 1 TABLET BY MOUTH EVERY DAY Patient taking differently: Take 10 mg by mouth daily. 07/20/21   Tolia, Sunit, DO  gabapentin (NEURONTIN) 300 MG capsule Take 1 capsule (300 mg total) by mouth daily after supper. 01/22/22 04/22/22  Standiford, Jenelle Mages, DPM  glucose blood (ONETOUCH ULTRA) test strip USE TO TEST 3 TIMES DAILY 06/27/17   [provider]  insulin aspart (NOVOLOG) 100 UNIT/ML injection Inject 0-15 Units into the skin 3 (three) times daily with meals. 10/10/21   Elgergawy, Leana Roe, MD  insulin aspart (NOVOLOG) 100 UNIT/ML injection Inject 0-5 Units into the skin at bedtime. 10/10/21   Elgergawy, Leana Roe, MD  insulin glargine-yfgn (SEMGLEE) 100 UNIT/ML injection Inject 0.35 mLs (35 Units total) into the skin 2 (two) times daily. 10/10/21   Elgergawy, Leana Roe, MD  levothyroxine (SYNTHROID) 88 MCG tablet Take 88 mcg by mouth daily before breakfast. 07/05/18   [provider]  Multiple Vitamin (MULTIVITAMIN) capsule Take 1 capsule by mouth daily.    [provider]  oxybutynin (DITROPAN XL) 15 MG 24 hr tablet Take 15 mg by mouth daily. 03/01/21   [provider]  oxyCODONE 10 MG TABS Take 1-1.5 tablets (10-15 mg total) by mouth every 4 (four) hours as needed for moderate pain. 10/10/21   Elgergawy, Leana Roe, MD  prednisoLONE acetate (PRED FORTE) 1 % ophthalmic suspension Place 1 drop into the right eye daily. 06/09/21   [provider]  senna (SENOKOT) 8.6 MG TABS tablet Take 2 tablets (17.2 mg total) by mouth daily. 10/10/21   Elgergawy, Leana Roe, MD  venlafaxine XR (EFFEXOR-XR) 150 MG 24 hr capsule Take 150 mg by mouth daily with breakfast. 07/03/18   [provider]    Physical  Exam: Vitals:   08/04/22 2219 08/05/22 0018  BP: (!) 150/78 135/75  Pulse: 68 70  Resp: 20 18  Temp: 98.1 F (36.7 C) 98 F (36.7 C)  TempSrc: Oral Oral  SpO2: 99% 98%  Weight: 102.2 kg   Height: 5\' 3"  (1.6 m)    Constitutional: Resting in bed, NAD, calm, comfortable Eyes: EOMI, lids and conjunctivae normal ENMT: Mucous membranes are moist. Posterior pharynx clear of any exudate or lesions.Normal dentition.  Neck: normal, supple, no masses. Respiratory: clear to auscultation bilaterally, no wheezing, no crackles. Normal respiratory effort. No accessory muscle use.  Cardiovascular: Regular rate and rhythm, no murmurs / rubs / gallops. No extremity edema. 2+ pedal pulses. Abdomen: no tenderness, no masses palpated. No hepatosplenomegaly. Bowel sounds positive.  Musculoskeletal: no clubbing / cyanosis. No joint deformity upper and lower extremities. Good ROM, no contractures. Normal muscle tone.  Skin: no rashes, lesions, ulcers. No induration Neurologic: Sensation intact. Strength 5/5 in all 4.  Psychiatric: Alert and oriented x 3. Normal mood.   EKG: Ordered  and pending.  Assessment/Plan Principal Problem:   Elevated troponin Active Problems:   Chronic combined systolic and diastolic CHF (congestive heart failure) (HCC)   Type 2 diabetes mellitus (HCC)   Hyperlipidemia associated with type 2 diabetes mellitus (HCC)   Paroxysmal atrial fibrillation (HCC)   VALLIE HOMSEY is a 72 y.o. female with medical history significant for CAD s/p DES to LAD (02/02/2021), HFrEF (EF 30-35% by TTE 07/13/2022), PAF on Eliquis, T2DM, HLD, obstructing left ureteral stone s/p ureteral stent 07/11/2022 who is transferred from [facility] for evaluation of elevated troponin per cardiology recommendation.  Assessment and Plan: Elevated troponin CAD s/p PCI with DES to LAD 01/2021: Transfer from [facility] for evaluation of elevated troponin (0.74 > 1.9 > 1.28).  Patient denies any chest  pain. -Consult [facility] cardiology in a.m. -Keep n.p.o. tonight -Continue aspirin, atorvastatin, Zetia  Chronic HFrEF: Appears euvolemic on admission.  EF 30-35% by TTE 07/13/2022.  Resume home meds pending updated labs and medication reconciliation.  Paroxysmal atrial fibrillation: In sinus rhythm on admission.  Continue Eliquis.  Type 2 diabetes: Reportedly treated for DKA at [facility] now transitioned to subcutaneous insulin.  UTI: Urine culture 7/29 grew E. Coli at [facility].  Will place on cefdinir.  Left ureteral stone s/p stenting 07/11/2022: CT urogram 7/29 shows stent in place with mild left hydronephrosis.  Hyperlipidemia: Continue atorvastatin and Zetia.   DVT prophylaxis:  apixaban (ELIQUIS) tablet 5 mg   Code Status: Full code, confirmed with patient on admission Family Communication: Discussed with patient, she has discussed with family Disposition Plan: From home and likely discharge to home pending clinical progress Consults called: [place] cardiology consult in a.m. Severity of Illness: The appropriate patient status for this patient is INPATIENT. Inpatient status is judged to be reasonable and necessary in order to provide the required intensity of service to ensure the patient's safety. The patient's presenting symptoms, physical exam findings, and initial radiographic and laboratory data in the context of their chronic comorbidities is felt to place them at high risk for further clinical deterioration. Furthermore, it is not anticipated that the patient will be medically stable for discharge from the hospital within 2 midnights of admission.   * I certify that at the point of admission it is my clinical judgment that the patient will require inpatient hospital care spanning beyond 2 midnights from the point of admission due to high intensity of service, high risk for further deterioration and high frequency of surveillance required.Darreld Mclean  MD [facility]  If 7PM-7AM, please contact night-coverage [email or web address]  08/05/2022, 1:01 AM

## 2022-08-05 ENCOUNTER — Ambulatory Visit (HOSPITAL_COMMUNITY): Admit: 2022-08-05 | Payer: Medicare Other | Admitting: Cardiology

## 2022-08-05 ENCOUNTER — Inpatient Hospital Stay (HOSPITAL_COMMUNITY)
Admission: EM | Disposition: A | Discharge status: Home / Self Care | Payer: Self-pay | Source: Other Acute Inpatient Hospital | Attending: Internal Medicine

## 2022-08-05 DIAGNOSIS — I2510 Atherosclerotic heart disease of native coronary artery without angina pectoris: Secondary | ICD-10-CM

## 2022-08-05 DIAGNOSIS — I10 Essential (primary) hypertension: Secondary | ICD-10-CM

## 2022-08-05 DIAGNOSIS — I480 Paroxysmal atrial fibrillation: Secondary | ICD-10-CM

## 2022-08-05 DIAGNOSIS — R7989 Other specified abnormal findings of blood chemistry: Secondary | ICD-10-CM | POA: Diagnosis not present

## 2022-08-05 DIAGNOSIS — I2089 Other forms of angina pectoris: Secondary | ICD-10-CM

## 2022-08-05 HISTORY — PX: LEFT HEART CATH AND CORS/GRAFTS ANGIOGRAPHY: CATH118250

## 2022-08-05 HISTORY — PX: CORONARY STENT INTERVENTION: CATH118234

## 2022-08-05 LAB — CBC
HCT: 32.6 % — ABNORMAL LOW (ref 36.0–46.0)
Hemoglobin: 10.4 g/dL — ABNORMAL LOW (ref 12.0–15.0)
MCH: 30.8 pg (ref 26.0–34.0)
MCHC: 31.9 g/dL (ref 30.0–36.0)
MCV: 96.4 fL (ref 80.0–100.0)
Platelets: 265 10*3/uL (ref 150–400)
RBC: 3.38 MIL/uL — ABNORMAL LOW (ref 3.87–5.11)
RDW: 13.2 % (ref 11.5–15.5)
WBC: 7.4 10*3/uL (ref 4.0–10.5)
nRBC: 0 % (ref 0.0–0.2)

## 2022-08-05 LAB — GLUCOSE, CAPILLARY
Glucose-Capillary: 130 mg/dL — ABNORMAL HIGH (ref 70–99)
Glucose-Capillary: 135 mg/dL — ABNORMAL HIGH (ref 70–99)
Glucose-Capillary: 143 mg/dL — ABNORMAL HIGH (ref 70–99)
Glucose-Capillary: 148 mg/dL — ABNORMAL HIGH (ref 70–99)
Glucose-Capillary: 155 mg/dL — ABNORMAL HIGH (ref 70–99)
Glucose-Capillary: 196 mg/dL — ABNORMAL HIGH (ref 70–99)
Glucose-Capillary: 196 mg/dL — ABNORMAL HIGH (ref 70–99)
Glucose-Capillary: 209 mg/dL — ABNORMAL HIGH (ref 70–99)

## 2022-08-05 LAB — TROPONIN I (HIGH SENSITIVITY)
Troponin I (High Sensitivity): 233 ng/L (ref <18)
Troponin I (High Sensitivity): 271 ng/L (ref <18)

## 2022-08-05 LAB — COMPREHENSIVE METABOLIC PANEL
ALT: 18 U/L (ref 0–44)
AST: 20 U/L (ref 15–41)
Albumin: 2.8 g/dL — ABNORMAL LOW (ref 3.5–5.0)
Alkaline Phosphatase: 86 U/L (ref 38–126)
Anion gap: 13 (ref 5–15)
BUN: 17 mg/dL (ref 8–23)
CO2: 23 mmol/L (ref 22–32)
Calcium: 8.5 mg/dL — ABNORMAL LOW (ref 8.9–10.3)
Chloride: 103 mmol/L (ref 98–111)
Creatinine, Ser: 0.94 mg/dL (ref 0.44–1.00)
GFR, Estimated: >60 mL/min (ref >60)
Glucose, Bld: 158 mg/dL — ABNORMAL HIGH (ref 70–99)
Potassium: 3.6 mmol/L (ref 3.5–5.1)
Sodium: 139 mmol/L (ref 135–145)
Total Bilirubin: 0.4 mg/dL (ref 0.3–1.2)
Total Protein: 7 g/dL (ref 6.5–8.1)

## 2022-08-05 LAB — POCT ACTIVATED CLOTTING TIME
Activated Clotting Time: 220 seconds
Activated Clotting Time: 232 seconds

## 2022-08-05 SURGERY — LEFT HEART CATH AND CORS/GRAFTS ANGIOGRAPHY
Anesthesia: LOCAL

## 2022-08-05 MED ORDER — INSULIN ASPART 100 UNIT/ML IJ SOLN
0.0000 [IU] | INTRAMUSCULAR | Status: DC
  Administered 2022-08-05: 1 [IU] via SUBCUTANEOUS
  Administered 2022-08-05 (×3): 2 [IU] via SUBCUTANEOUS
  Administered 2022-08-05: 1 [IU] via SUBCUTANEOUS
  Administered 2022-08-06 (×2): 2 [IU] via SUBCUTANEOUS

## 2022-08-05 MED ORDER — FENTANYL CITRATE (PF) 100 MCG/2ML IJ SOLN
INTRAMUSCULAR | Status: AC
  Filled 2022-08-05: qty 2

## 2022-08-05 MED ORDER — SODIUM CHLORIDE 0.9 % WEIGHT BASED INFUSION
3.0000 mL/kg/h | INTRAVENOUS | Status: DC
  Administered 2022-08-05: 3 mL/kg/h via INTRAVENOUS

## 2022-08-05 MED ORDER — HEPARIN SODIUM (PORCINE) 1000 UNIT/ML IJ SOLN
INTRAMUSCULAR | Status: DC | PRN
  Administered 2022-08-05: 2000 [IU] via INTRAVENOUS
  Administered 2022-08-05: 5000 [IU] via INTRAVENOUS
  Administered 2022-08-05: 3000 [IU] via INTRAVENOUS
  Administered 2022-08-05: 4000 [IU] via INTRAVENOUS
  Administered 2022-08-05: 5000 [IU] via INTRAVENOUS

## 2022-08-05 MED ORDER — ONDANSETRON HCL 4 MG/2ML IJ SOLN: 4.0000 mg | Freq: Four times a day (QID) | INTRAMUSCULAR | Status: DC | PRN

## 2022-08-05 MED ORDER — ONDANSETRON HCL 4 MG PO TABS: 4.0000 mg | ORAL_TABLET | Freq: Four times a day (QID) | ORAL | Status: DC | PRN

## 2022-08-05 MED ORDER — HEPARIN (PORCINE) IN NACL 1000-0.9 UT/500ML-% IV SOLN
INTRAVENOUS | Status: DC | PRN
  Administered 2022-08-05 (×2): 500 mL

## 2022-08-05 MED ORDER — SODIUM CHLORIDE 0.9% FLUSH
3.0000 mL | Freq: Two times a day (BID) | INTRAVENOUS | Status: DC
  Administered 2022-08-05 – 2022-08-06 (×3): 3 mL via INTRAVENOUS

## 2022-08-05 MED ORDER — SODIUM CHLORIDE 0.9 % WEIGHT BASED INFUSION
1.0000 mL/kg/h | INTRAVENOUS | Status: AC
  Administered 2022-08-05: 1 mL/kg/h via INTRAVENOUS

## 2022-08-05 MED ORDER — HEPARIN SODIUM (PORCINE) 1000 UNIT/ML IJ SOLN
INTRAMUSCULAR | Status: AC
  Filled 2022-08-05: qty 10

## 2022-08-05 MED ORDER — SODIUM CHLORIDE 0.9% FLUSH: 3.0000 mL | INTRAVENOUS | Status: DC | PRN

## 2022-08-05 MED ORDER — SODIUM CHLORIDE 0.9% FLUSH
3.0000 mL | Freq: Two times a day (BID) | INTRAVENOUS | Status: DC
  Administered 2022-08-05 – 2022-08-06 (×3): 3 mL via INTRAVENOUS

## 2022-08-05 MED ORDER — TICAGRELOR 90 MG PO TABS
ORAL_TABLET | ORAL | Status: AC
  Filled 2022-08-05: qty 2

## 2022-08-05 MED ORDER — NITROGLYCERIN 1 MG/10 ML FOR IR/CATH LAB
INTRA_ARTERIAL | Status: AC
  Filled 2022-08-05: qty 10

## 2022-08-05 MED ORDER — EZETIMIBE 10 MG PO TABS
10.0000 mg | ORAL_TABLET | Freq: Every day | ORAL | Status: DC
  Administered 2022-08-05 – 2022-08-07 (×3): 10 mg via ORAL
  Filled 2022-08-05 (×3): qty 1

## 2022-08-05 MED ORDER — FENTANYL CITRATE (PF) 100 MCG/2ML IJ SOLN
INTRAMUSCULAR | Status: DC | PRN
  Administered 2022-08-05: 50 ug via INTRAVENOUS

## 2022-08-05 MED ORDER — SODIUM CHLORIDE 0.9 % IV SOLN: 250.0000 mL | INTRAVENOUS | Status: DC | PRN

## 2022-08-05 MED ORDER — SODIUM CHLORIDE 0.9 % WEIGHT BASED INFUSION: 1.0000 mL/kg/h | INTRAVENOUS | Status: DC

## 2022-08-05 MED ORDER — SENNOSIDES-DOCUSATE SODIUM 8.6-50 MG PO TABS: 1.0000 | ORAL_TABLET | Freq: Every evening | ORAL | Status: DC | PRN

## 2022-08-05 MED ORDER — CLOPIDOGREL BISULFATE 75 MG PO TABS
75.0000 mg | ORAL_TABLET | Freq: Every day | ORAL | Status: DC
  Administered 2022-08-06 – 2022-08-07 (×2): 75 mg via ORAL
  Filled 2022-08-05 (×2): qty 1

## 2022-08-05 MED ORDER — CEFDINIR 300 MG PO CAPS
300.0000 mg | ORAL_CAPSULE | Freq: Two times a day (BID) | ORAL | Status: DC
  Administered 2022-08-05 – 2022-08-07 (×4): 300 mg via ORAL
  Filled 2022-08-05 (×5): qty 1

## 2022-08-05 MED ORDER — ACETAMINOPHEN 650 MG RE SUPP: 650.0000 mg | Freq: Four times a day (QID) | RECTAL | Status: DC | PRN

## 2022-08-05 MED ORDER — LIDOCAINE HCL (PF) 1 % IJ SOLN
INTRAMUSCULAR | Status: AC
  Filled 2022-08-05: qty 30

## 2022-08-05 MED ORDER — LIDOCAINE HCL (PF) 1 % IJ SOLN
INTRAMUSCULAR | Status: DC | PRN
  Administered 2022-08-05: 2 mL

## 2022-08-05 MED ORDER — APIXABAN 5 MG PO TABS
5.0000 mg | ORAL_TABLET | Freq: Two times a day (BID) | ORAL | Status: DC
  Administered 2022-08-05 – 2022-08-07 (×4): 5 mg via ORAL
  Filled 2022-08-05 (×4): qty 1

## 2022-08-05 MED ORDER — METOPROLOL SUCCINATE ER 25 MG PO TB24
12.5000 mg | ORAL_TABLET | Freq: Every day | ORAL | Status: DC
  Administered 2022-08-05 – 2022-08-07 (×3): 12.5 mg via ORAL
  Filled 2022-08-05 (×3): qty 1

## 2022-08-05 MED ORDER — ASPIRIN 81 MG PO TBEC
81.0000 mg | DELAYED_RELEASE_TABLET | Freq: Every day | ORAL | Status: DC
  Administered 2022-08-05 – 2022-08-07 (×3): 81 mg via ORAL
  Filled 2022-08-05 (×3): qty 1

## 2022-08-05 MED ORDER — ATORVASTATIN CALCIUM 40 MG PO TABS
40.0000 mg | ORAL_TABLET | Freq: Every day | ORAL | Status: DC
  Administered 2022-08-05 – 2022-08-07 (×3): 40 mg via ORAL
  Filled 2022-08-05 (×3): qty 1

## 2022-08-05 MED ORDER — ASPIRIN 81 MG PO CHEW: 81.0000 mg | CHEWABLE_TABLET | ORAL | Status: AC

## 2022-08-05 MED ORDER — SPIRONOLACTONE 25 MG PO TABS
25.0000 mg | ORAL_TABLET | Freq: Every day | ORAL | Status: DC
  Administered 2022-08-05 – 2022-08-07 (×3): 25 mg via ORAL
  Filled 2022-08-05 (×3): qty 1

## 2022-08-05 MED ORDER — IOHEXOL 350 MG/ML SOLN
INTRAVENOUS | Status: DC | PRN
  Administered 2022-08-05: 93 mL via INTRA_ARTERIAL

## 2022-08-05 MED ORDER — VERAPAMIL HCL 2.5 MG/ML IV SOLN
INTRAVENOUS | Status: DC | PRN
  Administered 2022-08-05: 10 mL via INTRA_ARTERIAL

## 2022-08-05 MED ORDER — TICAGRELOR 90 MG PO TABS
ORAL_TABLET | ORAL | Status: DC | PRN
  Administered 2022-08-05: 180 mg via ORAL

## 2022-08-05 MED ORDER — ACETAMINOPHEN 325 MG PO TABS: 650.0000 mg | ORAL_TABLET | Freq: Four times a day (QID) | ORAL | Status: DC | PRN

## 2022-08-05 MED ORDER — VERAPAMIL HCL 2.5 MG/ML IV SOLN
INTRAVENOUS | Status: AC
  Filled 2022-08-05: qty 2

## 2022-08-05 MED ORDER — HYDRALAZINE HCL 20 MG/ML IJ SOLN: 5.0000 mg | INTRAMUSCULAR | Status: AC | PRN

## 2022-08-05 SURGICAL SUPPLY — 22 items
BALLN SAPPHIRE 2.5X12 (BALLOONS) ×1
BALLOON SAPPHIRE 2.5X12 (BALLOONS) IMPLANT
CATH GUIDELINER COAST (CATHETERS) IMPLANT
CATH INFINITI AMBI 5FR TG (CATHETERS) IMPLANT
CATH LAUNCHER 6FR EBU3.5 (CATHETERS) IMPLANT
CATH VISTA GUIDE 6FR JR4 (CATHETERS) IMPLANT
DEVICE RAD COMP TR BAND LRG (VASCULAR PRODUCTS) IMPLANT
GLIDESHEATH SLEND SS 6F .021 (SHEATH) IMPLANT
GUIDEWIRE INQWIRE 1.5J.035X260 (WIRE) IMPLANT
INQWIRE 1.5J .035X260CM (WIRE) ×1
KIT ENCORE 26 ADVANTAGE (KITS) IMPLANT
KIT HEART LEFT (KITS) ×1 IMPLANT
PACK CARDIAC CATHETERIZATION (CUSTOM PROCEDURE TRAY) ×1 IMPLANT
PROTECTION STATION PRESSURIZED (MISCELLANEOUS) ×1
SET ATX-X65L (MISCELLANEOUS) IMPLANT
STATION PROTECTION PRESSURIZED (MISCELLANEOUS) IMPLANT
STENT ONYX FRONTIER 3.0X15 (Permanent Stent) IMPLANT
STENT SYNERGY XD 3.0X16 (Permanent Stent) IMPLANT
SYNERGY XD 3.0X16 (Permanent Stent) ×1 IMPLANT
TUBING CIL FLEX 10 FLL-RA (TUBING) ×1 IMPLANT
WIRE COUGAR XT STRL 190CM (WIRE) IMPLANT
WIRE MAILMAN 182CM (WIRE) IMPLANT

## 2022-08-05 NOTE — CV Procedure (Addendum)
Coronary angiogram 08/24/2022: LV hemodynamics: LV 162/6, EDP 16 mmHg.  Ao 165/78, mean 180 mmHg.  No pressure gradient across the aortic valve. LM: Large-caliber vessel, mildly calcified. LCx: Large-caliber vessel, has acute angle takeoff, moderate disease is noted in the proximal segment, 20 to 30% calcific stenosis.  AV groove has a ostial 70 to 80% followed by 70 to 80% stenosis in the proximal segment.  Large OM 2 with a high-grade 80 to 90% hazy ulcerated stenosis. LAD: Large-caliber vessel, has long stents noted in the proximal and mid LAD.  LAD is diffusely diseased.  Moderate amount of calcification is evident.  There is a large D1 and distal to the stent D2 with mild disease. RCA: Dominant,.  Large-caliber vessel.  Gives origin to large PDA and large PL branch.  PL branch has a hazy 80% stenosis in the midsegment.  Intervention data: Difficult procedure, successful PTCA and stenting of the mid circumflex after the origin of AV groove branch, a 3.0 x 15 mm frontier Onyx deployed at 12 atm pressure for 60 seconds.  Stenosis reduced from 80% to 0%.  No edge dissection.  Successful stenting of the PL branch with a 3.0 x 16 mm Synergy XD DES deployed at 13 atm pressure for 60 seconds, stenosis reduced from 80% stenosis to 0% stenosis with TIMI II to TIMI-3 flow.  Recommendation: Patient will be discharged home once stable from medical standpoint, triple therapy with aspirin for 4 weeks and Plavix 75 mg mg p.o. daily for 6 months to a year along with Eliquis in view of ACS.  93 mL contrast utilized.

## 2022-08-05 NOTE — Interval H&P Note (Signed)
History and Physical Interval Note:  08/05/2022 12:57 PM  Mallory Butler  has presented today for surgery, with the diagnosis of chest pain.  The various methods of treatment have been discussed with the patient and family. After consideration of risks, benefits and other options for treatment, the patient has consented to  Procedure(s): LEFT HEART CATH AND CORS/GRAFTS ANGIOGRAPHY (N/A) and possible coronary angioplasty as a surgical intervention.  The patient's history has been reviewed, patient examined, no change in status, stable for surgery.  I have reviewed the patient's chart and labs.  Questions were answered to the patient's satisfaction.     Yates Decamp

## 2022-08-05 NOTE — Hospital Course (Signed)
Mallory Butler is a 72 y.o. female with medical history significant for CAD s/p DES to LAD (02/02/2021), HFrEF (EF 30-35% by TTE 07/13/2022), PAF on Eliquis, T2DM, HLD, obstructing left ureteral stone s/p ureteral stent 07/11/2022 who is transferred from [facility] for evaluation of elevated troponin per cardiology recommendation.

## 2022-08-05 NOTE — H&P (Incomplete)
History and Physical    Mallory Butler DOB: December 29, 1950 DOA: 08/04/2022  PCP: Alinda Deem, MD  Patient coming from: [facility]  I have personally briefly reviewed patient's old medical records in [facility] Link  Chief Complaint: ***  HPI: Mallory Butler is a 72 y.o. female with medical history significant for CAD s/p DES to LAD (02/02/2021), HFrEF (EF 30-35% by TTE 07/13/2022), T2DM, HLD, obstructing left ureteral stone s/p ureteral stent 07/11/2022 who is admitted as a transfer from [facility] for evaluation of elevated troponin.  ***  ED Course  Labs/Imaging on admission: I have personally reviewed following labs and imaging studies.  ***  Review of Systems: All systems reviewed and are negative except as documented in history of present illness above.   Past Medical History:  Diagnosis Date  . CHF (congestive heart failure) (HCC)   . Coronary artery disease   . COVID-19   . Diabetes mellitus without complication (HCC)   . Hyperlipidemia   . NSTEMI (non-ST elevated myocardial infarction) (HCC)   . Thyroid disease     Past Surgical History:  Procedure Laterality Date  . CORONARY STENT INTERVENTION N/A 02/02/2021   Procedure: CORONARY STENT INTERVENTION;  Surgeon: Yates Decamp, MD;  Location: [facility] INV[facility] CV LAB;  Service: Cardiovascular;  Laterality: N/A;  . KNEE SURGERY Left   . RIGHT/LEFT HEART CATH AND CORONARY ANGIOGRAPHY N/A 02/02/2021   Procedure: RIGHT/LEFT HEART CATH AND CORONARY ANGIOGRAPHY;  Surgeon: Yates Decamp, MD;  Location: [facility] INVASIVE CV LAB;  Service: Cardiovascular;  Laterality: N/A;    Social History:  reports that she quit smoking about 52 years ago. Her smoking use included cigarettes. She has never used smokeless tobacco. She reports that she does not currently use alcohol. No history on file for drug use.  No Known Allergies  Family History  Problem Relation Age of Onset  . Hyperlipidemia Mother   . Cancer Mother   .  Heart attack Father   . Hypertension Father   . Heart attack Sister      Prior to Admission medications   Medication Sig Start Date End Date Taking? Authorizing Provider  apixaban (ELIQUIS) 5 MG TABS tablet Take 1 tablet (5 mg total) by mouth 2 (two) times daily. 10/10/21   Elgergawy, Leana Roe, MD  atorvastatin (LIPITOR) 40 MG tablet TAKE 1 TABLET BY MOUTH EVERYDAY AT BEDTIME Patient taking differently: Take 40 mg by mouth daily. 07/20/21   Tolia, Sunit, DO  bisoprolol (ZEBETA) 10 MG tablet Take 10 mg by mouth daily.    [provider]  clopidogrel (PLAVIX) 75 MG tablet Take 1 tablet (75 mg total) by mouth daily. 10/11/21   Elgergawy, Leana Roe, MD  ENTRESTO 97-103 MG TAKE 1 TABLET BY MOUTH TWICE A DAY 12/07/21   Tolia, Sunit, DO  ezetimibe (ZETIA) 10 MG tablet TAKE 1 TABLET BY MOUTH EVERY DAY Patient taking differently: Take 10 mg by mouth daily. 07/20/21   Tolia, Sunit, DO  gabapentin (NEURONTIN) 300 MG capsule Take 1 capsule (300 mg total) by mouth daily after supper. 01/22/22 04/22/22  Standiford, Jenelle Mages, DPM  glucose blood (ONETOUCH ULTRA) test strip USE TO TEST 3 TIMES DAILY 06/27/17   [provider]  insulin aspart (NOVOLOG) 100 UNIT/ML injection Inject 0-15 Units into the skin 3 (three) times daily with meals. 10/10/21   Elgergawy, Leana Roe, MD  insulin aspart (NOVOLOG) 100 UNIT/ML injection Inject 0-5 Units into the skin at bedtime. 10/10/21   Elgergawy, Leana Roe,  MD  insulin glargine-yfgn (SEMGLEE) 100 UNIT/ML injection Inject 0.35 mLs (35 Units total) into the skin 2 (two) times daily. 10/10/21   Elgergawy, Leana Roe, MD  levothyroxine (SYNTHROID) 88 MCG tablet Take 88 mcg by mouth daily before breakfast. 07/05/18   [provider]  Multiple Vitamin (MULTIVITAMIN) capsule Take 1 capsule by mouth daily.    [provider]  oxybutynin (DITROPAN XL) 15 MG 24 hr tablet Take 15 mg by mouth daily. 03/01/21   [provider]  oxyCODONE 10 MG TABS Take  1-1.5 tablets (10-15 mg total) by mouth every 4 (four) hours as needed for moderate pain. 10/10/21   Elgergawy, Leana Roe, MD  prednisoLONE acetate (PRED FORTE) 1 % ophthalmic suspension Place 1 drop into the right eye daily. 06/09/21   [provider]  senna (SENOKOT) 8.6 MG TABS tablet Take 2 tablets (17.2 mg total) by mouth daily. 10/10/21   Elgergawy, Leana Roe, MD  venlafaxine XR (EFFEXOR-XR) 150 MG 24 hr capsule Take 150 mg by mouth daily with breakfast. 07/03/18   [provider]    Physical Exam: Vitals:   08/04/22 2219  BP: (!) 150/78  Pulse: 68  Resp: 20  Temp: 98.1 F (36.7 C)  TempSrc: Oral  SpO2: 99%  Weight: 102.2 kg  Height: 5\' 3"  (1.6 m)   *** Constitutional: NAD, calm, comfortable Eyes: PERRL, lids and conjunctivae normal ENMT: Mucous membranes are moist. Posterior pharynx clear of any exudate or lesions.Normal dentition.  Neck: normal, supple, no masses. Respiratory: clear to auscultation bilaterally, no wheezing, no crackles. Normal respiratory effort. No accessory muscle use.  Cardiovascular: Regular rate and rhythm, no murmurs / rubs / gallops. No extremity edema. 2+ pedal pulses. Abdomen: no tenderness, no masses palpated. No hepatosplenomegaly. Bowel sounds positive.  Musculoskeletal: no clubbing / cyanosis. No joint deformity upper and lower extremities. Good ROM, no contractures. Normal muscle tone.  Skin: no rashes, lesions, ulcers. No induration Neurologic: CN 2-12 grossly intact. Sensation intact. Strength 5/5 in all 4.  Psychiatric: Normal judgment and insight. Alert and oriented x 3. Normal mood.   EKG: Personally reviewed. ***  Assessment/Plan Active Problems:   * No active hospital problems. *   *** No notes on file *** Assessment and Plan: No notes have been filed under this hospital service. Service: Hospitalist      DVT prophylaxis: ***  Code Status: ***  Family Communication: ***  Disposition Plan: ***  Consults  called: ***  Severity of Illness: {Observation/Inpatient:21159}  Darreld Mclean MD [facility]  If 7PM-7AM, please contact night-coverage [email or web address]  08/04/2022, 11:50 PM

## 2022-08-05 NOTE — Care Management (Signed)
Transition of Care [facility]) - Inpatient Brief Assessment   Patient Details  Name: Mallory Butler MRN: [id number] Date of Birth: 09-Nov-1950  Transition of [facility]) CM/SW Contact:    Lockie Pares, RN Phone Number: 08/05/2022, 5:13 PM   Clinical Narrative: 72 year old patient transferred from [facility] with DKA, previous history of DES and uteral stent. Does have PCP,  LHC done another DES placed.  Recommend HH RN for disease management, may go home tomorrow.  SDOH screening not done yet, Called patient, unavailable at this time.  TOC will follow for needs, recommendations, and transitions.    Transition of Care Asessment: Insurance and Status: Insurance coverage has been reviewed Patient has primary care physician: Yes   Prior level of function:: independent Prior/Current Home Services: No current home services   Readmission risk has been reviewed: Yes Transition of care needs: transition of care needs identified, TOC will continue to follow

## 2022-08-05 NOTE — H&P (View-Only) (Signed)
CARDIOLOGY CONSULT NOTE  Patient ID: Mallory Butler MRN: [id number] DOB/AGE: Dec 13, 1950 72 y.o.  Admit date: 08/04/2022 Attending physician: Zannie Cove, MD Primary Physician:  Alinda Deem, MD Outpatient Cardiology Provider: Tessa Lerner, DO, [facility] Inpatient Cardiologist: Tessa Lerner, DO, [facility]  Reason of consultation: CAD, elevated troponins Referring physician: Dr. Thelma Comp  Chief complaint: elevated troponin   HPI:  Mallory Butler is a 72 y.o. Caucasian female who presents with a chief complaint of " elevated troponins." Her past medical history and cardiovascular risk factors include: Hypertension, hyperlipidemia, diabetes mellitus type 2, CAD status post NSTEMI (January 2023) in the setting of COVID-19, status post PCI to the proximal /mid LAD, paroxysmal A-fib with RVR.  Patient was recently hospitalized at [facility] for nephrolithiasis and underwent cystoscopy with stent placement.  She was supposed to follow-up for removal of the stents and lithotripsy.  However in the interim she developed altered mental status and was found by her neighbors EMS was called and patient was brought to [facility].  At [facility] she was noted to be in DKA and was being treated for urinary tract infection secondary to E. coli.  Troponins were checked which peaked at 1.90.  Cardiology at [facility] was consulted and she was evaluated by the service.  Based on the records obtained she had an echo cardiogram done which noted LVEF of 50-55% with distal inferior/posterior, inferoseptal, anteroseptal region hypokinetic at the apical levels per documentation.  I do not have images for review.  Dr. Thelma Comp reached out to transfer the patient to help facilitate care.  She was concerned that given the regional wall motion abnormalities and prior CAD with coronary interventions she needs to have a repeat heart catheterization.  Patient is not having active chest pain but has dyspnea on exertion  which at times is getting progressively worse and more noticeable.  She has also had multiple cardiovascular risk factors and has stopped taking her diabetes medications which led to hemoglobin A1c of around 12 since that she is been compliant her glycemic control is improving.  ALLERGIES: No Known Allergies  PAST MEDICAL HISTORY: Past Medical History:  Diagnosis Date   CHF (congestive heart failure) (HCC)    Coronary artery disease    COVID-19    Diabetes mellitus without complication (HCC)    Hyperlipidemia    NSTEMI (non-ST elevated myocardial infarction) (HCC)    Thyroid disease     PAST SURGICAL HISTORY: Past Surgical History:  Procedure Laterality Date   CORONARY STENT INTERVENTION N/A 02/02/2021   Procedure: CORONARY STENT INTERVENTION;  Surgeon: Yates Decamp, MD;  Location: [facility] INVASIVE CV LAB;  Service: Cardiovascular;  Laterality: N/A;   KNEE SURGERY Left    RIGHT/LEFT HEART CATH AND CORONARY ANGIOGRAPHY N/A 02/02/2021   Procedure: RIGHT/LEFT HEART CATH AND CORONARY ANGIOGRAPHY;  Surgeon: Yates Decamp, MD;  Location: [facility] INVASIVE CV LAB;  Service: Cardiovascular;  Laterality: N/A;    FAMILY HISTORY: The patient's family history includes Cancer in her mother; Heart attack in her father and sister; Hyperlipidemia in her mother; Hypertension in her father.   SOCIAL HISTORY:  The patient  reports that she quit smoking about 52 years ago. Her smoking use included cigarettes. She has never used smokeless tobacco. She reports that she does not currently use alcohol.  MEDICATIONS: Current Outpatient Medications  Medication Instructions   apixaban (ELIQUIS) 5 mg, Oral, 2 times daily   atorvastatin (LIPITOR) 40 MG tablet TAKE 1 TABLET BY MOUTH EVERYDAY AT BEDTIME   bisoprolol (  ZEBETA) 10 mg, Oral, Daily   buPROPion (WELLBUTRIN XL) 150 mg, Oral, Daily   ciprofloxacin (CIPRO) 500 mg, Oral, 2 times daily   clopidogrel (PLAVIX) 75 mg, Oral, Daily   ENTRESTO 97-103 MG 1 tablet, Oral, 2  times daily   ezetimibe (ZETIA) 10 MG tablet TAKE 1 TABLET BY MOUTH EVERY DAY   gabapentin (NEURONTIN) 300 mg, Oral, Daily after supper   glipiZIDE (GLUCOTROL) 10 mg, Oral, 2 times daily before meals   glucose blood (ONETOUCH ULTRA) test strip USE TO TEST 3 TIMES DAILY   hydrOXYzine (ATARAX) 10-20 mg, Oral, Every 8 hours PRN   insulin aspart (NOVOLOG) 0-15 Units, Subcutaneous, 3 times daily with meals   insulin aspart (NOVOLOG) 0-5 Units, Subcutaneous, Daily at bedtime   insulin glargine-yfgn (SEMGLEE) 35 Units, Subcutaneous, 2 times daily   levothyroxine (SYNTHROID) 88 mcg, Oral, Daily before breakfast   metFORMIN (GLUCOPHAGE-XR) 1,000 mg, Oral, 2 times daily   metoprolol succinate (TOPROL-XL) 25 mg, Oral, Daily   Multiple Vitamin (MULTIVITAMIN) capsule 1 capsule, Oral, Daily   ondansetron (ZOFRAN-ODT) 4 mg, Oral, Every 8 hours PRN   oxybutynin (DITROPAN XL) 15 mg, Oral, Daily   oxyCODONE (OXY IR/ROXICODONE) 5 mg, Oral, Every 4 hours PRN   Oxycodone HCl 10-15 mg, Oral, Every 4 hours PRN   Ozempic (2 MG/DOSE) 2 mg, Subcutaneous, Weekly   prednisoLONE acetate (PRED FORTE) 1 % ophthalmic suspension 1 drop, Right Eye, Daily   senna (SENOKOT) 17.2 mg, Oral, Daily   spironolactone (ALDACTONE) 12.5 mg, Oral, Daily   tamsulosin (FLOMAX) 0.4 mg, Oral, Daily   torsemide (DEMADEX) 10 mg, Oral, Daily   venlafaxine XR (EFFEXOR-XR) 150 mg, Oral, Daily with breakfast    REVIEW OF SYSTEMS: Review of Systems  Cardiovascular:  Positive for dyspnea on exertion. Negative for chest pain, claudication, irregular heartbeat, leg swelling, near-syncope, orthopnea, palpitations, paroxysmal nocturnal dyspnea and syncope.  Respiratory:  Negative for shortness of breath.   Hematologic/Lymphatic: Negative for bleeding problem.  Musculoskeletal:  Negative for muscle cramps and myalgias.  Neurological:  Negative for dizziness and light-headedness.    PHYSICAL EXAMINATION: PHYSICAL EXAM: Temp:  [98 F (36.7  C)-98.2 F (36.8 C)] 98.2 F (36.8 C) (08/01 0833) Pulse Rate:  [68-72] 69 (08/01 0833) Resp:  [15-20] 18 (08/01 0833) BP: (135-150)/(73-80) 149/80 (08/01 0833) SpO2:  [91 %-100 %] 100 % (08/01 0833) Weight:  [102 kg-102.2 kg] 102 kg (08/01 0459)  Intake/Output:  Intake/Output Summary (Last 24 hours) at 08/05/2022 0956 Last data filed at 08/05/2022 0835 Gross per 24 hour  Intake 0 ml  Output 0 ml  Net 0 ml     Net IO Since Admission: 0 mL [08/05/22 0956]  Weights:     08/05/2022    4:59 AM 08/04/2022   10:19 PM 10/09/2021    5:00 AM  Last 3 Weights  Weight (lbs) 224 lb 13.9 oz 225 lb 5 oz 244 lb 0.8 oz  Weight (kg) 102 kg 102.2 kg 110.7 kg     Physical Exam  Constitutional: No distress.  Age appropriate, hemodynamically stable, no acute distress.   Neck: No JVD present.  Cardiovascular: Normal rate, regular rhythm, S1 normal, S2 normal, intact distal pulses and normal pulses. Exam reveals no gallop, no S3, no S4 and no friction rub.  No murmur heard. Pulmonary/Chest: Effort normal and breath sounds normal. No stridor. She has no wheezes. She has no rales. She exhibits no tenderness.  Abdominal: Soft. Bowel sounds are normal. She exhibits no distension. There is  no abdominal tenderness.  Musculoskeletal:        General: No tenderness or edema.     Cervical back: Neck supple.  Neurological: She is alert and oriented to person, place, and time. She has intact cranial nerves (2-12).  Skin: Skin is warm and dry.    LAB RESULTS: Chemistry Recent Labs  Lab 08/05/22 0259  NA 139  K 3.6  CL 103  CO2 23  GLUCOSE 158*  BUN 17  CREATININE 0.94  CALCIUM 8.5*  PROT 7.0  ALBUMIN 2.8*  AST 20  ALT 18  ALKPHOS 86  BILITOT 0.4  GFRNONAA >60  ANIONGAP 13    Hematology Recent Labs  Lab 08/05/22 0259  WBC 7.4  RBC 3.38*  HGB 10.4*  HCT 32.6*  MCV 96.4  MCH 30.8  MCHC 31.9  RDW 13.2  PLT 265   High Sensitivity Troponin:   Recent Labs  Lab 08/05/22 0259  08/05/22 0516  TROPONINIHS 233* 271*     Cardiac EnzymesNo results for input(s): "TROPONINI" in the last 168 hours. No results for input(s): "TROPIPOC" in the last 168 hours.  BNPNo results for input(s): "BNP", "PROBNP" in the last 168 hours.  DDimer No results for input(s): "DDIMER" in the last 168 hours.  Hemoglobin A1c:  Lab Results  Component Value Date   HGBA1C 10.4 (H) 10/02/2021   MPG 251.78 10/02/2021   TSH No results for input(s): "TSH" in the last 8760 hours. Lipid Panel  Lab Results  Component Value Date   CHOL 131 07/16/2021   HDL 55 07/16/2021   LDLCALC 55 07/16/2021   LDLDIRECT 51 07/16/2021   TRIG 119 07/16/2021   CHOLHDL 3.8 02/02/2021   Drugs of Abuse  No results found for: "LABOPIA", "COCAINSCRNUR", "LABBENZ", "AMPHETMU", "THCU", "LABBARB"    CARDIAC DATABASE: EKG: August 05, 2022: Sinus rhythm, 69 bpm, consider anteroseptal infarct, without underlying injury pattern  Echocardiogram: 10/03/2021  1. Left ventricular ejection fraction, by estimation, is 60 to 65%. The  left ventricle has normal function. The left ventricle demonstrates  regional wall motion abnormalities (see scoring diagram/findings for  description). There is moderate concentric  left ventricular hypertrophy. Left ventricular diastolic parameters are  indeterminate.   2. Right ventricular systolic function is normal. The right ventricular  size is normal. There is normal pulmonary artery systolic pressure. The  estimated right ventricular systolic pressure is 19.6 mmHg.   3. Left atrial size was moderately dilated.   4. The mitral valve is abnormal. Trivial mitral valve regurgitation.   5. The aortic valve is tricuspid. Aortic valve regurgitation is not  visualized. Aortic valve sclerosis/calcification is present, without any  evidence of aortic stenosis.   6. The inferior vena cava is normal in size with greater than 50%  respiratory variability, suggesting right atrial pressure of 3  mmHg.   Recent echo at [facility] - no report sent w/ the records. But documentation noted. LVEF of 50-55% with distal inferior/posterior, inferoseptal, anteroseptal region hypokinetic at the apical levels per documentation  Heart Catheterization: 02/02/2021:   Mid Cx lesion is 90% stenosed.   Ost Cx to Prox Cx lesion is 30% stenosed.   1st Diag lesion is 99% stenosed.   Prox LAD to Mid LAD lesion is 100% stenosed.   A drug-eluting stent was successfully placed using a STENT ONYX FRONTIER [id number].   A drug-eluting stent was successfully placed using a STENT ONYX FRONTIER [id number].   Post intervention, there is a 0% residual stenosis.   Right  and left Heart Catheterization 02/02/21:   RA: 16/14, mean 14 mmHg RV 52/15, EDP 17 mmHg PA 50/22, mean 37 mmHg. PW 27/29, mean 24 mmHg. QP/QS 1.00.  CO 4.2, CI 2.02 by Fick.   LV: 112/15, EDP 29 mmHg.  Ao: 97/60, mean 76 mmHg.  There was no pressure gradient across the aortic valve. LM: Mildly calcified but widely patent. LAD: Large vessel in the proximal segment, moderately calcified, occluded in the midsegment after the origin of a large diagonal 1 which has ostial 99% stenosis. CX: Large vessel, proximal segment has a 30% stenosis, large OM1 with a proximal 20 to 30% stenosis, AV groove has 90 to 95% stenosis in the proximal to mid segment. RCA: Large vessel, mild luminal irregularity.   Interventional data: Successful PTCA and stenting of the mid LAD with implantation of a 2.5 x 26 mm Onyx frontier DES, proximally into the proximal LAD and into the distal LM 82.75 x 12 mm Onyx frontier stent deployed postdilated with 3.5 x 8 mm [place] balloon at 20 atm pressure for 30 seconds x 2.  Complete expansion of the stent into the left main.  No sidebranch jailing of D1 which has ostial 99% stenosis.  TIMI 0 to TIMI-3 flow at the end of the procedure.  110 mL contrast utilized.  Scheduled Meds:  apixaban  5 mg Oral BID   aspirin EC  81 mg Oral Daily    atorvastatin  40 mg Oral Daily   cefdinir  300 mg Oral Q12H   ezetimibe  10 mg Oral Daily   insulin aspart  0-9 Units Subcutaneous Q4H   metoprolol succinate  12.5 mg Oral Daily   sodium chloride flush  3 mL Intravenous Q12H    Continuous Infusions:   PRN Meds: acetaminophen **OR** acetaminophen, ondansetron **OR** ondansetron (ZOFRAN) IV, senna-docusate  IMPRESSION & RECOMMENDATIONS: Mallory Butler is a 72 y.o. Caucasian female whose past medical history and cardiovascular risk factors include: Hypertension, hyperlipidemia, diabetes mellitus type 2, CAD status post NSTEMI (January 2023) in the setting of COVID-19, status post PCI to the proximal /mid LAD, paroxysmal A-fib with RVR.  Impression:  Coronary artery disease with prior PCI's and anginal equivalents Elevated troponins-multifactorial in the setting of DKA, UTI, underlying CAD and worsening dyspnea on exertion Hypertension. Hyperlipidemia. Diabetes mellitus type 2 with uncontrolled glycemic control. Paroxysmal atrial fibrillation  Medical decision making/recommendations:  Patient presented to [facility] in acute DKA and urinary tract infection.  Troponins peaked at 1.9 and echocardiogram noted low normal LVEF with regional wall motion abnormalities.  Given her prior coronary interventions she was transferred to [facility] for further evaluation and management.  She denies anginal chest pain but has been having shortness of breath more noticeable with effort related activities.  Her last heart catheterization was in January 2023 noted proximal to mid LAD 100% stenosis and underwent drug-eluting stents to the distal left main/LAD distribution.  Given the regional wall motion abnormalities, multiple cardiovascular risk factors, worsening dyspnea on exertion, and likely did not follow-up as outpatient we discussed ischemic workup prior to discharge.  Discussed the role of left heart catheterization as well as  stress testing to evaluate for reversible ischemia.  Given her comorbidities, presentation, and history patient prefers to proceed with angiography with possible intervention.  The procedure of left heart catheterization with possible intervention was explained to the patient  in detail.  The indication, alternatives, risks and benefits were reviewed.  Complications include but not limited to bleeding,  infection, vascular injury, stroke, myocardial infarction, arrhythmia (requiring medical or cardiopulmonary resuscitation), kidney injury (requiring short-term or long-term hemodialysis), radiation-related injury in the case of prolonged fluoroscopy use, emergent cardiac surgery, temporary or permanent pacemaker, and death. The patient  understands the risks of serious complication is 1-2 in 1000 with diagnostic cardiac cath and 1-2% or less with angioplasty/stenting.  The patient  voices understanding and provides verbal feedback her questions and concerns are addressed to her satisfaction and patient wishes to proceed with coronary angiography with possible PCI.  As part of medical decision making reviewed prior progress notes, EKGs, records available from [facility] (in the physical chart).  Paroxysmal atrial fibrillation: Will start low-dose Toprol-XL 12.5 mg p.o. daily.  Continue anticoagulation.  Hyperlipidemia: Continue statin therapy. Will check fasting lipid profile  Management of other chronic comorbid conditions per primary team.  Patient's questions and concerns were addressed to her satisfaction. She voices understanding of the instructions provided during this encounter.   This note was created using a voice recognition software as a result there may be grammatical errors inadvertently enclosed that do not reflect the nature of this encounter. Every attempt is made to correct such errors.  Delilah Shan [facility]  Pager:  [phone] Office: [phone] 08/05/2022, 9:56 AM

## 2022-08-05 NOTE — Progress Notes (Signed)
Troponin this am 233, much lower than at [facility] where she came from, no CP.  On call MD notified, thanks Lavonda Jumbo RN.

## 2022-08-05 NOTE — Consult Note (Signed)
CARDIOLOGY CONSULT NOTE  Patient ID: Mallory Butler MRN: [id number] DOB/AGE: Dec 13, 1950 72 y.o.  Admit date: 08/04/2022 Attending physician: Zannie Cove, MD Primary Physician:  Alinda Deem, MD Outpatient Cardiology Provider: Tessa Lerner, DO, [facility] Inpatient Cardiologist: Tessa Lerner, DO, [facility]  Reason of consultation: CAD, elevated troponins Referring physician: Dr. Thelma Comp  Chief complaint: elevated troponin   HPI:  Mallory Butler is a 72 y.o. Caucasian female who presents with a chief complaint of " elevated troponins." Her past medical history and cardiovascular risk factors include: Hypertension, hyperlipidemia, diabetes mellitus type 2, CAD status post NSTEMI (January 2023) in the setting of COVID-19, status post PCI to the proximal /mid LAD, paroxysmal A-fib with RVR.  Patient was recently hospitalized at [facility] for nephrolithiasis and underwent cystoscopy with stent placement.  She was supposed to follow-up for removal of the stents and lithotripsy.  However in the interim she developed altered mental status and was found by her neighbors EMS was called and patient was brought to [facility].  At [facility] she was noted to be in DKA and was being treated for urinary tract infection secondary to E. coli.  Troponins were checked which peaked at 1.90.  Cardiology at [facility] was consulted and she was evaluated by the service.  Based on the records obtained she had an echo cardiogram done which noted LVEF of 50-55% with distal inferior/posterior, inferoseptal, anteroseptal region hypokinetic at the apical levels per documentation.  I do not have images for review.  Dr. Thelma Comp reached out to transfer the patient to help facilitate care.  She was concerned that given the regional wall motion abnormalities and prior CAD with coronary interventions she needs to have a repeat heart catheterization.  Patient is not having active chest pain but has dyspnea on exertion  which at times is getting progressively worse and more noticeable.  She has also had multiple cardiovascular risk factors and has stopped taking her diabetes medications which led to hemoglobin A1c of around 12 since that she is been compliant her glycemic control is improving.  ALLERGIES: No Known Allergies  PAST MEDICAL HISTORY: Past Medical History:  Diagnosis Date   CHF (congestive heart failure) (HCC)    Coronary artery disease    COVID-19    Diabetes mellitus without complication (HCC)    Hyperlipidemia    NSTEMI (non-ST elevated myocardial infarction) (HCC)    Thyroid disease     PAST SURGICAL HISTORY: Past Surgical History:  Procedure Laterality Date   CORONARY STENT INTERVENTION N/A 02/02/2021   Procedure: CORONARY STENT INTERVENTION;  Surgeon: Yates Decamp, MD;  Location: [facility] INVASIVE CV LAB;  Service: Cardiovascular;  Laterality: N/A;   KNEE SURGERY Left    RIGHT/LEFT HEART CATH AND CORONARY ANGIOGRAPHY N/A 02/02/2021   Procedure: RIGHT/LEFT HEART CATH AND CORONARY ANGIOGRAPHY;  Surgeon: Yates Decamp, MD;  Location: [facility] INVASIVE CV LAB;  Service: Cardiovascular;  Laterality: N/A;    FAMILY HISTORY: The patient's family history includes Cancer in her mother; Heart attack in her father and sister; Hyperlipidemia in her mother; Hypertension in her father.   SOCIAL HISTORY:  The patient  reports that she quit smoking about 52 years ago. Her smoking use included cigarettes. She has never used smokeless tobacco. She reports that she does not currently use alcohol.  MEDICATIONS: Current Outpatient Medications  Medication Instructions   apixaban (ELIQUIS) 5 mg, Oral, 2 times daily   atorvastatin (LIPITOR) 40 MG tablet TAKE 1 TABLET BY MOUTH EVERYDAY AT BEDTIME   bisoprolol (  ZEBETA) 10 mg, Oral, Daily   buPROPion (WELLBUTRIN XL) 150 mg, Oral, Daily   ciprofloxacin (CIPRO) 500 mg, Oral, 2 times daily   clopidogrel (PLAVIX) 75 mg, Oral, Daily   ENTRESTO 97-103 MG 1 tablet, Oral, 2  times daily   ezetimibe (ZETIA) 10 MG tablet TAKE 1 TABLET BY MOUTH EVERY DAY   gabapentin (NEURONTIN) 300 mg, Oral, Daily after supper   glipiZIDE (GLUCOTROL) 10 mg, Oral, 2 times daily before meals   glucose blood (ONETOUCH ULTRA) test strip USE TO TEST 3 TIMES DAILY   hydrOXYzine (ATARAX) 10-20 mg, Oral, Every 8 hours PRN   insulin aspart (NOVOLOG) 0-15 Units, Subcutaneous, 3 times daily with meals   insulin aspart (NOVOLOG) 0-5 Units, Subcutaneous, Daily at bedtime   insulin glargine-yfgn (SEMGLEE) 35 Units, Subcutaneous, 2 times daily   levothyroxine (SYNTHROID) 88 mcg, Oral, Daily before breakfast   metFORMIN (GLUCOPHAGE-XR) 1,000 mg, Oral, 2 times daily   metoprolol succinate (TOPROL-XL) 25 mg, Oral, Daily   Multiple Vitamin (MULTIVITAMIN) capsule 1 capsule, Oral, Daily   ondansetron (ZOFRAN-ODT) 4 mg, Oral, Every 8 hours PRN   oxybutynin (DITROPAN XL) 15 mg, Oral, Daily   oxyCODONE (OXY IR/ROXICODONE) 5 mg, Oral, Every 4 hours PRN   Oxycodone HCl 10-15 mg, Oral, Every 4 hours PRN   Ozempic (2 MG/DOSE) 2 mg, Subcutaneous, Weekly   prednisoLONE acetate (PRED FORTE) 1 % ophthalmic suspension 1 drop, Right Eye, Daily   senna (SENOKOT) 17.2 mg, Oral, Daily   spironolactone (ALDACTONE) 12.5 mg, Oral, Daily   tamsulosin (FLOMAX) 0.4 mg, Oral, Daily   torsemide (DEMADEX) 10 mg, Oral, Daily   venlafaxine XR (EFFEXOR-XR) 150 mg, Oral, Daily with breakfast    REVIEW OF SYSTEMS: Review of Systems  Cardiovascular:  Positive for dyspnea on exertion. Negative for chest pain, claudication, irregular heartbeat, leg swelling, near-syncope, orthopnea, palpitations, paroxysmal nocturnal dyspnea and syncope.  Respiratory:  Negative for shortness of breath.   Hematologic/Lymphatic: Negative for bleeding problem.  Musculoskeletal:  Negative for muscle cramps and myalgias.  Neurological:  Negative for dizziness and light-headedness.    PHYSICAL EXAMINATION: PHYSICAL EXAM: Temp:  [98 F (36.7  C)-98.2 F (36.8 C)] 98.2 F (36.8 C) (08/01 0833) Pulse Rate:  [68-72] 69 (08/01 0833) Resp:  [15-20] 18 (08/01 0833) BP: (135-150)/(73-80) 149/80 (08/01 0833) SpO2:  [91 %-100 %] 100 % (08/01 0833) Weight:  [102 kg-102.2 kg] 102 kg (08/01 0459)  Intake/Output:  Intake/Output Summary (Last 24 hours) at 08/05/2022 0956 Last data filed at 08/05/2022 0835 Gross per 24 hour  Intake 0 ml  Output 0 ml  Net 0 ml     Net IO Since Admission: 0 mL [08/05/22 0956]  Weights:     08/05/2022    4:59 AM 08/04/2022   10:19 PM 10/09/2021    5:00 AM  Last 3 Weights  Weight (lbs) 224 lb 13.9 oz 225 lb 5 oz 244 lb 0.8 oz  Weight (kg) 102 kg 102.2 kg 110.7 kg     Physical Exam  Constitutional: No distress.  Age appropriate, hemodynamically stable, no acute distress.   Neck: No JVD present.  Cardiovascular: Normal rate, regular rhythm, S1 normal, S2 normal, intact distal pulses and normal pulses. Exam reveals no gallop, no S3, no S4 and no friction rub.  No murmur heard. Pulmonary/Chest: Effort normal and breath sounds normal. No stridor. She has no wheezes. She has no rales. She exhibits no tenderness.  Abdominal: Soft. Bowel sounds are normal. She exhibits no distension. There is  no abdominal tenderness.  Musculoskeletal:        General: No tenderness or edema.     Cervical back: Neck supple.  Neurological: She is alert and oriented to person, place, and time. She has intact cranial nerves (2-12).  Skin: Skin is warm and dry.    LAB RESULTS: Chemistry Recent Labs  Lab 08/05/22 0259  NA 139  K 3.6  CL 103  CO2 23  GLUCOSE 158*  BUN 17  CREATININE 0.94  CALCIUM 8.5*  PROT 7.0  ALBUMIN 2.8*  AST 20  ALT 18  ALKPHOS 86  BILITOT 0.4  GFRNONAA >60  ANIONGAP 13    Hematology Recent Labs  Lab 08/05/22 0259  WBC 7.4  RBC 3.38*  HGB 10.4*  HCT 32.6*  MCV 96.4  MCH 30.8  MCHC 31.9  RDW 13.2  PLT 265   High Sensitivity Troponin:   Recent Labs  Lab 08/05/22 0259  08/05/22 0516  TROPONINIHS 233* 271*     Cardiac EnzymesNo results for input(s): "TROPONINI" in the last 168 hours. No results for input(s): "TROPIPOC" in the last 168 hours.  BNPNo results for input(s): "BNP", "PROBNP" in the last 168 hours.  DDimer No results for input(s): "DDIMER" in the last 168 hours.  Hemoglobin A1c:  Lab Results  Component Value Date   HGBA1C 10.4 (H) 10/02/2021   MPG 251.78 10/02/2021   TSH No results for input(s): "TSH" in the last 8760 hours. Lipid Panel  Lab Results  Component Value Date   CHOL 131 07/16/2021   HDL 55 07/16/2021   LDLCALC 55 07/16/2021   LDLDIRECT 51 07/16/2021   TRIG 119 07/16/2021   CHOLHDL 3.8 02/02/2021   Drugs of Abuse  No results found for: "LABOPIA", "COCAINSCRNUR", "LABBENZ", "AMPHETMU", "THCU", "LABBARB"    CARDIAC DATABASE: EKG: August 05, 2022: Sinus rhythm, 69 bpm, consider anteroseptal infarct, without underlying injury pattern  Echocardiogram: 10/03/2021  1. Left ventricular ejection fraction, by estimation, is 60 to 65%. The  left ventricle has normal function. The left ventricle demonstrates  regional wall motion abnormalities (see scoring diagram/findings for  description). There is moderate concentric  left ventricular hypertrophy. Left ventricular diastolic parameters are  indeterminate.   2. Right ventricular systolic function is normal. The right ventricular  size is normal. There is normal pulmonary artery systolic pressure. The  estimated right ventricular systolic pressure is 19.6 mmHg.   3. Left atrial size was moderately dilated.   4. The mitral valve is abnormal. Trivial mitral valve regurgitation.   5. The aortic valve is tricuspid. Aortic valve regurgitation is not  visualized. Aortic valve sclerosis/calcification is present, without any  evidence of aortic stenosis.   6. The inferior vena cava is normal in size with greater than 50%  respiratory variability, suggesting right atrial pressure of 3  mmHg.   Recent echo at [facility] - no report sent w/ the records. But documentation noted. LVEF of 50-55% with distal inferior/posterior, inferoseptal, anteroseptal region hypokinetic at the apical levels per documentation  Heart Catheterization: 02/02/2021:   Mid Cx lesion is 90% stenosed.   Ost Cx to Prox Cx lesion is 30% stenosed.   1st Diag lesion is 99% stenosed.   Prox LAD to Mid LAD lesion is 100% stenosed.   A drug-eluting stent was successfully placed using a STENT ONYX FRONTIER [id number].   A drug-eluting stent was successfully placed using a STENT ONYX FRONTIER [id number].   Post intervention, there is a 0% residual stenosis.   Right  and left Heart Catheterization 02/02/21:   RA: 16/14, mean 14 mmHg RV 52/15, EDP 17 mmHg PA 50/22, mean 37 mmHg. PW 27/29, mean 24 mmHg. QP/QS 1.00.  CO 4.2, CI 2.02 by Fick.   LV: 112/15, EDP 29 mmHg.  Ao: 97/60, mean 76 mmHg.  There was no pressure gradient across the aortic valve. LM: Mildly calcified but widely patent. LAD: Large vessel in the proximal segment, moderately calcified, occluded in the midsegment after the origin of a large diagonal 1 which has ostial 99% stenosis. CX: Large vessel, proximal segment has a 30% stenosis, large OM1 with a proximal 20 to 30% stenosis, AV groove has 90 to 95% stenosis in the proximal to mid segment. RCA: Large vessel, mild luminal irregularity.   Interventional data: Successful PTCA and stenting of the mid LAD with implantation of a 2.5 x 26 mm Onyx frontier DES, proximally into the proximal LAD and into the distal LM 82.75 x 12 mm Onyx frontier stent deployed postdilated with 3.5 x 8 mm [place] balloon at 20 atm pressure for 30 seconds x 2.  Complete expansion of the stent into the left main.  No sidebranch jailing of D1 which has ostial 99% stenosis.  TIMI 0 to TIMI-3 flow at the end of the procedure.  110 mL contrast utilized.  Scheduled Meds:  apixaban  5 mg Oral BID   aspirin EC  81 mg Oral Daily    atorvastatin  40 mg Oral Daily   cefdinir  300 mg Oral Q12H   ezetimibe  10 mg Oral Daily   insulin aspart  0-9 Units Subcutaneous Q4H   metoprolol succinate  12.5 mg Oral Daily   sodium chloride flush  3 mL Intravenous Q12H    Continuous Infusions:   PRN Meds: acetaminophen **OR** acetaminophen, ondansetron **OR** ondansetron (ZOFRAN) IV, senna-docusate  IMPRESSION & RECOMMENDATIONS: Mallory Butler is a 72 y.o. Caucasian female whose past medical history and cardiovascular risk factors include: Hypertension, hyperlipidemia, diabetes mellitus type 2, CAD status post NSTEMI (January 2023) in the setting of COVID-19, status post PCI to the proximal /mid LAD, paroxysmal A-fib with RVR.  Impression:  Coronary artery disease with prior PCI's and anginal equivalents Elevated troponins-multifactorial in the setting of DKA, UTI, underlying CAD and worsening dyspnea on exertion Hypertension. Hyperlipidemia. Diabetes mellitus type 2 with uncontrolled glycemic control. Paroxysmal atrial fibrillation  Medical decision making/recommendations:  Patient presented to [facility] in acute DKA and urinary tract infection.  Troponins peaked at 1.9 and echocardiogram noted low normal LVEF with regional wall motion abnormalities.  Given her prior coronary interventions she was transferred to [facility] for further evaluation and management.  She denies anginal chest pain but has been having shortness of breath more noticeable with effort related activities.  Her last heart catheterization was in January 2023 noted proximal to mid LAD 100% stenosis and underwent drug-eluting stents to the distal left main/LAD distribution.  Given the regional wall motion abnormalities, multiple cardiovascular risk factors, worsening dyspnea on exertion, and likely did not follow-up as outpatient we discussed ischemic workup prior to discharge.  Discussed the role of left heart catheterization as well as  stress testing to evaluate for reversible ischemia.  Given her comorbidities, presentation, and history patient prefers to proceed with angiography with possible intervention.  The procedure of left heart catheterization with possible intervention was explained to the patient  in detail.  The indication, alternatives, risks and benefits were reviewed.  Complications include but not limited to bleeding,  infection, vascular injury, stroke, myocardial infarction, arrhythmia (requiring medical or cardiopulmonary resuscitation), kidney injury (requiring short-term or long-term hemodialysis), radiation-related injury in the case of prolonged fluoroscopy use, emergent cardiac surgery, temporary or permanent pacemaker, and death. The patient  understands the risks of serious complication is 1-2 in 1000 with diagnostic cardiac cath and 1-2% or less with angioplasty/stenting.  The patient  voices understanding and provides verbal feedback her questions and concerns are addressed to her satisfaction and patient wishes to proceed with coronary angiography with possible PCI.  As part of medical decision making reviewed prior progress notes, EKGs, records available from [facility] (in the physical chart).  Paroxysmal atrial fibrillation: Will start low-dose Toprol-XL 12.5 mg p.o. daily.  Continue anticoagulation.  Hyperlipidemia: Continue statin therapy. Will check fasting lipid profile  Management of other chronic comorbid conditions per primary team.  Patient's questions and concerns were addressed to her satisfaction. She voices understanding of the instructions provided during this encounter.   This note was created using a voice recognition software as a result there may be grammatical errors inadvertently enclosed that do not reflect the nature of this encounter. Every attempt is made to correct such errors.  Delilah Shan [facility]  Pager:  [phone] Office: [phone] 08/05/2022, 9:56 AM

## 2022-08-05 NOTE — Progress Notes (Signed)
PROGRESS NOTE    Mallory Butler  ION:[id number] DOB: 1950-08-06 DOA: 08/04/2022 PCP: Alinda Deem, MD  71/F with history of CAD, multiple PCI and stents in 1/23, paroxysmal A-fib, hypertension, diabetes mellitus was recently hospitalized at [facility] in [place] for obstructing left ureteral stone associated with septic shock and E. coli bacteremia, stabilized on antibiotics, underwent left ureteral stenting 7/7, at that time had an echo done which noted EF of 30-35% she was started on Jardiance and metoprolol, and home Sherryll Burger was continued. -She presented to [facility] Sunday night 7/28 with severe fatigue and weakness noted to be in DKA with elevated troponins also found to have E. coli UTI. -Troponin was elevated 0.7> 1.9> 1.28. -Workup in [facility] noted CT urogram 7/29 which noted left ureteral stent with mild residual left hydronephrosis, nonobstructive left renal calculus, urine culture from 7/29 grew E. Coli, seen by cardiology at [facility] for elevated troponin, echo noted wall motion abnormality at outside hospital and transfer was requested to [facility], accepted by Dr. Odis Hollingshead  Subjective: -Feels okay overall, no events overnight  Assessment and Plan:  Elevated troponin CAD s/p PCI with DES to LAD 01/2021: Transfer from [facility] for evaluation of elevated troponin (0.74 > 1.9 > 1.28).   -Echo this week at [facility] noted wall motion abnormalities [facility] cardiology consulting, continue aspirin, statin, Zetia, starting Toprol today -Plan for left heart cath   Chronic HFrEF: Appears euvolemic on admission.  EF 30-35% by TTE 07/13/2022.   -Add Aldactone today -Avoid SGLT2i with recent UTI -Add low-dose ARB post cath if kidney function is stable   Paroxysmal atrial fibrillation: -Starting Toprol, continue Eliquis.   Type 2 diabetes: Reportedly treated for DKA at [facility] now transitioned to subcutaneous insulin.   UTI: Urine culture 7/29 grew E.  Coli at [facility] -Continue cefdinir X 5 days of antibiotics total   Left ureteral stone s/p stenting 07/11/2022: CT urogram 7/29 shows stent in place with mild left hydronephrosis.   Hyperlipidemia: Continue atorvastatin and Zetia.   DVT prophylaxis: apixaban (ELIQUIS) tablet 5 mg   Code Status: Full code, Family Communication: No family at bedside  disposition Plan: Home pending cardiac eval  Consultants:    Procedures:   Antimicrobials:    Objective: Vitals:   08/05/22 0018 08/05/22 0458 08/05/22 0459 08/05/22 0833  BP: 135/75  139/73 (!) 149/80  Pulse: 70 68 72 69  Resp: 18  15 18   Temp: 98 F (36.7 C)  98.1 F (36.7 C) 98.2 F (36.8 C)  TempSrc: Oral  Oral Oral  SpO2: 98% 98% 91% 100%  Weight:   102 kg   Height:        Intake/Output Summary (Last 24 hours) at 08/05/2022 0959 Last data filed at 08/05/2022 2440 Gross per 24 hour  Intake 0 ml  Output 0 ml  Net 0 ml   Filed Weights   08/04/22 2219 08/05/22 0459  Weight: 102.2 kg 102 kg    Examination:  General exam: Obese pleasant female appears calm and comfortable  Respiratory system: Clear to auscultation Cardiovascular system: S1 & S2 heard, RRR.  Abd: nondistended, soft and nontender.Normal bowel sounds heard. Central nervous system: Alert and oriented. No focal neurological deficits. Extremities: no edema Skin: No rashes Psychiatry:  Mood & affect appropriate.     Data Reviewed:   CBC: Recent Labs  Lab 08/05/22 0259  WBC 7.4  HGB 10.4*  HCT 32.6*  MCV 96.4  PLT 265   Basic Metabolic Panel: Recent  Labs  Lab 08/05/22 0259  NA 139  K 3.6  CL 103  CO2 23  GLUCOSE 158*  BUN 17  CREATININE 0.94  CALCIUM 8.5*   GFR: Estimated Creatinine Clearance: 62.6 mL/min (by C-G formula based on SCr of 0.94 mg/dL). Liver Function Tests: Recent Labs  Lab 08/05/22 0259  AST 20  ALT 18  ALKPHOS 86  BILITOT 0.4  PROT 7.0  ALBUMIN 2.8*   No results for input(s): "LIPASE", "AMYLASE"  in the last 168 hours. No results for input(s): "AMMONIA" in the last 168 hours. Coagulation Profile: No results for input(s): "INR", "PROTIME" in the last 168 hours. Cardiac Enzymes: No results for input(s): "CKTOTAL", "CKMB", "CKMBINDEX", "TROPONINI" in the last 168 hours. BNP (last 3 results) No results for input(s): "PROBNP" in the last 8760 hours. HbA1C: No results for input(s): "HGBA1C" in the last 72 hours. CBG: Recent Labs  Lab 08/04/22 2233 08/05/22 0059 08/05/22 0422 08/05/22 0836  GLUCAP 194* 196* 143* 130*   Lipid Profile: No results for input(s): "CHOL", "HDL", "LDLCALC", "TRIG", "CHOLHDL", "LDLDIRECT" in the last 72 hours. Thyroid Function Tests: No results for input(s): "TSH", "T4TOTAL", "FREET4", "T3FREE", "THYROIDAB" in the last 72 hours. Anemia Panel: No results for input(s): "VITAMINB12", "FOLATE", "FERRITIN", "TIBC", "IRON", "RETICCTPCT" in the last 72 hours. Urine analysis:    Component Value Date/Time   COLORURINE AMBER (A) 10/03/2021 1157   APPEARANCEUR HAZY (A) 10/03/2021 1157   LABSPEC 1.023 10/03/2021 1157   PHURINE 5.0 10/03/2021 1157   GLUCOSEU 50 (A) 10/03/2021 1157   HGBUR NEGATIVE 10/03/2021 1157   BILIRUBINUR NEGATIVE 10/03/2021 1157   KETONESUR 5 (A) 10/03/2021 1157   PROTEINUR NEGATIVE 10/03/2021 1157   NITRITE NEGATIVE 10/03/2021 1157   LEUKOCYTESUR TRACE (A) 10/03/2021 1157   Sepsis Labs: @LABRCNTIP (procalcitonin:4,lacticidven:4)  )No results found for this or any previous visit (from the past 240 hour(s)).   Radiology Studies: No results found.   Scheduled Meds:  apixaban  5 mg Oral BID   aspirin EC  81 mg Oral Daily   atorvastatin  40 mg Oral Daily   cefdinir  300 mg Oral Q12H   ezetimibe  10 mg Oral Daily   insulin aspart  0-9 Units Subcutaneous Q4H   metoprolol succinate  12.5 mg Oral Daily   sodium chloride flush  3 mL Intravenous Q12H   Continuous Infusions:   LOS: 1 day    Time spent:    Zannie Cove, MD [facility]   08/05/2022, 9:59 AM

## 2022-08-05 NOTE — Progress Notes (Signed)
Heart Failure Navigator Progress Note  Assessed for Heart & Vascular TOC clinic readiness.  Patient does not meet criteria [facility] cardiology consulted.  Navigator available for reassessment of patient.   Sharen Hones, PharmD, BCPS Heart Failure Stewardship Pharmacist Phone [phone]

## 2022-08-06 ENCOUNTER — Other Ambulatory Visit: Payer: Self-pay

## 2022-08-06 ENCOUNTER — Encounter (HOSPITAL_COMMUNITY): Payer: Self-pay | Admitting: Cardiology

## 2022-08-06 DIAGNOSIS — E1165 Type 2 diabetes mellitus with hyperglycemia: Secondary | ICD-10-CM

## 2022-08-06 DIAGNOSIS — E781 Pure hyperglyceridemia: Secondary | ICD-10-CM

## 2022-08-06 DIAGNOSIS — E1169 Type 2 diabetes mellitus with other specified complication: Secondary | ICD-10-CM

## 2022-08-06 DIAGNOSIS — Z95820 Peripheral vascular angioplasty status with implants and grafts: Secondary | ICD-10-CM

## 2022-08-06 DIAGNOSIS — I480 Paroxysmal atrial fibrillation: Secondary | ICD-10-CM

## 2022-08-06 DIAGNOSIS — I10 Essential (primary) hypertension: Secondary | ICD-10-CM

## 2022-08-06 DIAGNOSIS — I2510 Atherosclerotic heart disease of native coronary artery without angina pectoris: Secondary | ICD-10-CM

## 2022-08-06 DIAGNOSIS — E785 Hyperlipidemia, unspecified: Secondary | ICD-10-CM

## 2022-08-06 LAB — GLUCOSE, CAPILLARY
Glucose-Capillary: 175 mg/dL — ABNORMAL HIGH (ref 70–99)
Glucose-Capillary: 280 mg/dL — ABNORMAL HIGH (ref 70–99)
Glucose-Capillary: 267 mg/dL — ABNORMAL HIGH (ref 70–99)
Glucose-Capillary: 299 mg/dL — ABNORMAL HIGH (ref 70–99)
Glucose-Capillary: 317 mg/dL — ABNORMAL HIGH (ref 70–99)

## 2022-08-06 LAB — MRSA NEXT GEN BY PCR, NASAL: MRSA by PCR Next Gen: NOT DETECTED

## 2022-08-06 MED ORDER — ICOSAPENT ETHYL 1 G PO CAPS
2.0000 g | ORAL_CAPSULE | Freq: Two times a day (BID) | ORAL | Status: DC
  Administered 2022-08-06 – 2022-08-07 (×3): 2 g via ORAL
  Filled 2022-08-06 (×4): qty 2

## 2022-08-06 MED ORDER — POTASSIUM CHLORIDE CRYS ER 20 MEQ PO TBCR
40.0000 meq | EXTENDED_RELEASE_TABLET | Freq: Two times a day (BID) | ORAL | Status: AC
  Administered 2022-08-06 (×2): 40 meq via ORAL
  Filled 2022-08-06 (×2): qty 2

## 2022-08-06 MED ORDER — SACUBITRIL-VALSARTAN 24-26 MG PO TABS
1.0000 | ORAL_TABLET | Freq: Two times a day (BID) | ORAL | Status: DC
  Administered 2022-08-06 – 2022-08-07 (×3): 1 via ORAL
  Filled 2022-08-06 (×3): qty 1

## 2022-08-06 MED ORDER — FUROSEMIDE 10 MG/ML IJ SOLN
40.0000 mg | Freq: Once | INTRAMUSCULAR | Status: AC
  Administered 2022-08-06: 40 mg via INTRAVENOUS
  Filled 2022-08-06: qty 4

## 2022-08-06 MED ORDER — INSULIN ASPART 100 UNIT/ML IJ SOLN
0.0000 [IU] | Freq: Three times a day (TID) | INTRAMUSCULAR | Status: DC
  Administered 2022-08-06: 7 [IU] via SUBCUTANEOUS
  Administered 2022-08-06 (×2): 5 [IU] via SUBCUTANEOUS
  Administered 2022-08-07: 7 [IU] via SUBCUTANEOUS
  Administered 2022-08-07: 3 [IU] via SUBCUTANEOUS

## 2022-08-06 MED FILL — Nitroglycerin IV Soln 100 MCG/ML in D5W: INTRA_ARTERIAL | Qty: 10 | Status: AC

## 2022-08-06 NOTE — Plan of Care (Signed)
  Problem: Education: Goal: Knowledge of General Education information will improve Description Including pain rating scale, medication(s)/side effects and non-pharmacologic comfort measures Outcome: Progressing   Problem: Clinical Measurements: Goal: Ability to maintain clinical measurements within normal limits will improve Outcome: Progressing Goal: Will remain free from infection Outcome: Progressing Goal: Respiratory complications will improve Outcome: Progressing Goal: Cardiovascular complication will be avoided Outcome: Progressing   Problem: Activity: Goal: Risk for activity intolerance will decrease Outcome: Progressing   Problem: Elimination: Goal: Will not experience complications related to urinary retention Outcome: Progressing   Problem: Pain Managment: Goal: General experience of comfort will improve Outcome: Progressing

## 2022-08-06 NOTE — Inpatient Diabetes Management (Signed)
Inpatient Diabetes Program Recommendations  AACE/ADA: New Consensus Statement on Inpatient Glycemic Control   Target Ranges:  Prepandial:   less than 140 mg/dL      Peak postprandial:   less than 180 mg/dL (1-2 hours)      Critically ill patients:  140 - 180 mg/dL    Latest Reference Range & Units 08/05/22 04:22 08/05/22 08:36 08/05/22 11:38 08/05/22 16:17 08/05/22 17:32 08/05/22 20:08 08/05/22 23:30 08/06/22 03:47  Glucose-Capillary 70 - 99 mg/dL 829 (H) 562 (H) 130 (H) 148 (H) 155 (H) 209 (H) 196 (H) 175 (H)   Review of Glycemic Control  Diabetes history: DM2 Outpatient Diabetes medications: Metformin 1000 BID, Ozempic 2 mg Qweek, Glipizide 10 mg BID, Novolog 0-15 units TID with meals, Novolog 0-5 units at bedtime, Semglee 35 units QAM, Semglee 30 units at bedtime Current orders for Inpatient glycemic control: Novolog 0-9 units Q4H  Inpatient Diabetes Program Recommendations:    Insulin: If CBGs become consistently over 180 mg/dl with Novolog correction, may need to consider ordering basal insulin.  Thanks, Orlando Penner, RN, MSN, CDCES Diabetes Coordinator Inpatient Diabetes Program [phone] (Team Pager from 8am to 5pm)

## 2022-08-06 NOTE — Progress Notes (Signed)
PROGRESS NOTE    Mallory Butler  ZOX:[id number] DOB: August 18, 1950 DOA: 08/04/2022 PCP: Alinda Deem, MD  71/F with history of CAD, multiple PCI and stents in 1/23, paroxysmal A-fib, hypertension, diabetes mellitus was recently hospitalized at [facility] in [place] for obstructing left ureteral stone associated with septic shock and E. coli bacteremia, stabilized on antibiotics, underwent left ureteral stenting 7/7, at that time had an echo done which noted EF of 30-35% she was started on Jardiance and metoprolol, and home Sherryll Burger was continued. -She presented to [facility] Sunday night 7/28 with severe fatigue and weakness noted to be in DKA with elevated troponins also found to have E. coli UTI. -Troponin was elevated 0.7> 1.9> 1.28. -Workup in [facility] noted CT urogram 7/29 which noted left ureteral stent with mild residual left hydronephrosis, nonobstructive left renal calculus, urine culture from 7/29 grew E. Coli, seen by cardiology at [facility] for elevated troponin, echo noted wall motion abnormality at outside hospital and transfer was requested to [facility], accepted by Dr. Odis Hollingshead  Subjective: -Denies chest pain today, some shortness of breath  Assessment and Plan:  Elevated troponin CAD s/p PCI with DES to LAD 01/2021: Transfer from [facility] for evaluation of elevated troponin (0.74 > 1.9 > 1.28).   -Echo this week at [facility] noted wall motion abnormalities [facility] cardiology consulting, continue aspirin, statin, Zetia, starting Toprol today -Plan for left heart cath   Acute on chronic systolic CHF  EF 81-19% by TTE 07/13/2022.   -Appears volume overloaded, started Aldactone yesterday -Avoid SGLT2i with recent UTI -IV Lasix x 1, starting Entresto at low-dose -BMP in a.m.   Paroxysmal atrial fibrillation: -Starting Toprol, continue Eliquis.   Type 2 diabetes: Reportedly treated for DKA at [facility] now transitioned to subcutaneous insulin.    UTI: Urine culture 7/29 grew E. Coli at [facility] -Continue cefdinir X 5 days of antibiotics total   Left ureteral stone s/p stenting 07/11/2022: CT urogram 7/29 shows stent in place with mild left hydronephrosis. -Follow-up with urology next week at [facility] for definitive management of stone   Hyperlipidemia: Continue atorvastatin and Zetia.   DVT prophylaxis: apixaban (ELIQUIS) tablet 5 mg   Code Status: Full code, Family Communication: No family at bedside  disposition Plan: Home pending cardiac eval  Consultants:    Procedures:   Antimicrobials:    Objective: Vitals:   08/06/22 0219 08/06/22 0348 08/06/22 0810 08/06/22 1154  BP:  (!) 157/68 (!) 142/71 (!) 141/73  Pulse:  64 72 74  Resp:  12 17 18   Temp:  98.7 F (37.1 C) 97.8 F (36.6 C) 98.5 F (36.9 C)  TempSrc:  Oral Oral Oral  SpO2:  98% 96% 97%  Weight: 101.8 kg     Height:        Intake/Output Summary (Last 24 hours) at 08/06/2022 1252 Last data filed at 08/06/2022 1158 Gross per 24 hour  Intake 1512.5 ml  Output 300 ml  Net 1212.5 ml   Filed Weights   08/04/22 2219 08/05/22 0459 08/06/22 0219  Weight: 102.2 kg 102 kg 101.8 kg    Examination:  General exam: Obese pleasant female sitting up in bed, AAOx3 HEENT: Positive JVD CVS: S1-S2, regular rhythm Lungs: Few basilar Rales otherwise clear Abdomen: Soft, nontender, bowel sounds present Extremities: No edema Skin: No rashes Psychiatry:  Mood & affect appropriate.     Data Reviewed:   CBC: Recent Labs  Lab 08/05/22 0259 08/06/22 0141  WBC 7.4 7.3  HGB 10.4* 9.4*  HCT 32.6* 29.6*  MCV 96.4 97.7  PLT 265 250   Basic Metabolic Panel: Recent Labs  Lab 08/05/22 0259 08/06/22 0141  NA 139 139  K 3.6 3.5  CL 103 106  CO2 23 24  GLUCOSE 158* 196*  BUN 17 12  CREATININE 0.94 0.89  CALCIUM 8.5* 8.2*   GFR: Estimated Creatinine Clearance: 66.1 mL/min (by C-G formula based on SCr of 0.89 mg/dL). Liver Function  Tests: Recent Labs  Lab 08/05/22 0259  AST 20  ALT 18  ALKPHOS 86  BILITOT 0.4  PROT 7.0  ALBUMIN 2.8*   No results for input(s): "LIPASE", "AMYLASE" in the last 168 hours. No results for input(s): "AMMONIA" in the last 168 hours. Coagulation Profile: No results for input(s): "INR", "PROTIME" in the last 168 hours. Cardiac Enzymes: No results for input(s): "CKTOTAL", "CKMB", "CKMBINDEX", "TROPONINI" in the last 168 hours. BNP (last 3 results) No results for input(s): "PROBNP" in the last 8760 hours. HbA1C: No results for input(s): "HGBA1C" in the last 72 hours. CBG: Recent Labs  Lab 08/05/22 1732 08/05/22 2008 08/05/22 2330 08/06/22 0347 08/06/22 1129  GLUCAP 155* 209* 196* 175* 280*   Lipid Profile: Recent Labs    08/06/22 0141  CHOL 119  HDL 29*  LDLCALC 57  TRIG 213*  CHOLHDL 4.1  LDLDIRECT 61   Thyroid Function Tests: No results for input(s): "TSH", "T4TOTAL", "FREET4", "T3FREE", "THYROIDAB" in the last 72 hours. Anemia Panel: No results for input(s): "VITAMINB12", "FOLATE", "FERRITIN", "TIBC", "IRON", "RETICCTPCT" in the last 72 hours. Urine analysis:    Component Value Date/Time   COLORURINE AMBER (A) 10/03/2021 1157   APPEARANCEUR HAZY (A) 10/03/2021 1157   LABSPEC 1.023 10/03/2021 1157   PHURINE 5.0 10/03/2021 1157   GLUCOSEU 50 (A) 10/03/2021 1157   HGBUR NEGATIVE 10/03/2021 1157   BILIRUBINUR NEGATIVE 10/03/2021 1157   KETONESUR 5 (A) 10/03/2021 1157   PROTEINUR NEGATIVE 10/03/2021 1157   NITRITE NEGATIVE 10/03/2021 1157   LEUKOCYTESUR TRACE (A) 10/03/2021 1157   Sepsis Labs: @LABRCNTIP (procalcitonin:4,lacticidven:4)  )No results found for this or any previous visit (from the past 240 hour(s)).   Radiology Studies: CARDIAC CATHETERIZATION  Addendum Date: 08/06/2022   Coronary angiogram 08/24/2022: LV hemodynamics: LV 162/6, EDP 16 mmHg.  Ao 165/78, mean 180 mmHg.  No pressure gradient across the aortic valve. LM: Large-caliber vessel,  mildly calcified. LCx: Large-caliber vessel, has acute angle takeoff, moderate disease is noted in the proximal segment, 20 to 30% calcific stenosis.  AV groove has a ostial 70 to 80% followed by 70 to 80% stenosis in the proximal segment.  Large OM 2 with a high-grade 80 to 90% hazy ulcerated stenosis. LAD: Large-caliber vessel, has long stents noted in the proximal and mid LAD placed on 01/06/2021 (2 overlapping 2.75 x 12 and 2.5 x 26 millimeter Onyx frontier DES postdilated in the ostium at 3.5 mm Annex balloon) are widely patent, mid to distal LAD is diffusely diseased.  Moderate amount of calcification is evident.  There is a large D1 and distal to the stent D2 with mild disease. RCA: Dominant,.  Large-caliber vessel.  Gives origin to large PDA and large PL branch.  PL branch has a hazy 80% stenosis in the midsegment.  Intervention data: Difficult procedure, successful PTCA and stenting of the mid circumflex after the origin of AV groove branch, a 3.0 x 15 mm frontier Onyx deployed at 12 atm pressure for 60 seconds.  Stenosis reduced from 80% to 0%.  No edge dissection.  Successful stenting of the PL branch of RCA with a 3.0 x 16 mm Synergy XD DES deployed at 13 atm pressure for 60 seconds, stenosis reduced from 80% stenosis to 0% stenosis with TIMI II to TIMI-3 flow.  Recommendation: Patient will be discharged home once stable from medical standpoint, triple therapy with aspirin for 4 weeks and Plavix 75 mg mg p.o. daily for 6 months to a year along with Eliquis in view of ACS.  93 mL contrast utilized.  Result Date: 08/06/2022 Coronary angiogram 08/24/2022: LV hemodynamics: LV 162/6, EDP 16 mmHg.  Ao 165/78, mean 180 mmHg.  No pressure gradient across the aortic valve. LM: Large-caliber vessel, mildly calcified. LCx: Large-caliber vessel, has acute angle takeoff, moderate disease is noted in the proximal segment, 20 to 30% calcific stenosis.  AV groove has a ostial 70 to 80% followed by 70 to 80% stenosis in the  proximal segment.  Large OM 2 with a high-grade 80 to 90% hazy ulcerated stenosis. LAD: Large-caliber vessel, has long stents noted in the proximal and mid LAD placed on 01/06/2021 (2 overlapping 2.75 x 12 and 2.5 x 26 millimeter Onyx frontier DES postdilated in the ostium at 3.5 mm Hudson balloon) are widely patent, mid to distal LAD is diffusely diseased.  Moderate amount of calcification is evident.  There is a large D1 and distal to the stent D2 with mild disease. RCA: Dominant,.  Large-caliber vessel.  Gives origin to large PDA and large PL branch.  PL branch has a hazy 80% stenosis in the midsegment.  Intervention data: Difficult procedure, successful PTCA and stenting of the mid circumflex after the origin of AV groove branch, a 3.0 x 15 mm frontier Onyx deployed at 12 atm pressure for 60 seconds.  Stenosis reduced from 80% to 0%.  No edge dissection.  Successful stenting of the PL branch with a 3.0 x 16 mm Synergy XD DES deployed at 13 atm pressure for 60 seconds, stenosis reduced from 80% stenosis to 0% stenosis with TIMI II to TIMI-3 flow.  Recommendation: Patient will be discharged home once stable from medical standpoint, triple therapy with aspirin for 4 weeks and Plavix 75 mg mg p.o. daily for 6 months to a year along with Eliquis in view of ACS.  93 mL contrast utilized.     Scheduled Meds:  apixaban  5 mg Oral BID   aspirin EC  81 mg Oral Daily   atorvastatin  40 mg Oral Daily   cefdinir  300 mg Oral Q12H   clopidogrel  75 mg Oral Q breakfast   ezetimibe  10 mg Oral Daily   icosapent Ethyl  2 g Oral BID   insulin aspart  0-9 Units Subcutaneous TID AC & HS   metoprolol succinate  12.5 mg Oral Daily   potassium chloride  40 mEq Oral BID   sacubitril-valsartan  1 tablet Oral BID   sodium chloride flush  3 mL Intravenous Q12H   sodium chloride flush  3 mL Intravenous Q12H   spironolactone  25 mg Oral Daily   Continuous Infusions:  sodium chloride       LOS: 2 days    Time spent:     Zannie Cove, MD [facility]   08/06/2022, 12:52 PM

## 2022-08-06 NOTE — Progress Notes (Signed)
CARDIAC REHAB PHASE I   PRE:  Rate/Rhythm: 71 NSR  BP:  Sitting: 142/71      SaO2: 98 RA  MODE:  Ambulation: 44 ft   AD:   no AD  POST:  Rate/Rhythm: 106 ST  BP:  Sitting: 190/80    124/71      SaO2: 98 RA  Pt amb with standby assistance, pt denies CP and reports moderate SOB during amb and was returned to room w/o complaint.   Pt SOB with min activity, held onto rails for support while ambulating. I encouraged her to use RW next time with MS.   Pt was educated on stent card, stent location, Plavix, Eliquis, and ASA use, wt restrictions, no baths/daily wash-ups, s/s of infection, ex guidelines, s/s to stop exercising, NTG use and calling 911, heart healthy diet, risk factors and CRPII. Pt received materials on exercise, diet, and CRPII. Will refer to [facility].   Faustino Congress  MS, ACSM-CEP 10:21 AM 08/06/2022    Service time is from 0915 to 1021.

## 2022-08-06 NOTE — Plan of Care (Signed)

## 2022-08-06 NOTE — Progress Notes (Signed)
Progress Note  Patient Name: Mallory Butler MRN: [id number] DOB: 03/10/1950 Date of Encounter: 08/06/2022  Attending physician: Zannie Cove, MD Primary care provider: Alinda Deem, MD Primary Cardiologist: Tessa Lerner, DO, [facility]  Subjective: Mallory Butler is a 72 y.o. Caucasian female who was seen and examined at bedside  No chest pain. Shortness of breath improving  Not on [place] oxygen.   Objective: Vital Signs in the last 24 hours: Temp:  [97.8 F (36.6 C)-98.7 F (37.1 C)] 97.8 F (36.6 C) (08/02 0810) Pulse Rate:  [64-72] 72 (08/02 0810) Resp:  [12-19] 17 (08/02 0810) BP: (129-164)/(60-78) 142/71 (08/02 0810) SpO2:  [94 %-100 %] 96 % (08/02 0810) Weight:  [101.8 kg] 101.8 kg (08/02 0219)  Intake/Output:  Intake/Output Summary (Last 24 hours) at 08/06/2022 0854 Last data filed at 08/06/2022 9562 Gross per 24 hour  Intake 1512.5 ml  Output --  Net 1512.5 ml    Net IO Since Admission: 1,512.5 mL [08/06/22 0854]  Weights:     08/06/2022    2:19 AM 08/05/2022    4:59 AM 08/04/2022   10:19 PM  Last 3 Weights  Weight (lbs) 224 lb 6.9 oz 224 lb 13.9 oz 225 lb 5 oz  Weight (kg) 101.8 kg 102 kg 102.2 kg      Telemetry:  Overnight telemetry shows predominantly sinus, 1 brief episode of PSVT, which I personally reviewed.   Physical examination: PHYSICAL EXAM: Vitals:   08/06/22 0100 08/06/22 0219 08/06/22 0348 08/06/22 0810  BP: 129/60  (!) 157/68 (!) 142/71  Pulse: 68  64 72  Resp: 19  12 17   Temp:   98.7 F (37.1 C) 97.8 F (36.6 C)  TempSrc:   Oral Oral  SpO2: 94%  98% 96%  Weight:  101.8 kg    Height:        Physical Exam  Constitutional: No distress.  Age appropriate, hemodynamically stable.   Neck: No JVD present.  Cardiovascular: Normal rate, regular rhythm, S1 normal, S2 normal, intact distal pulses and normal pulses. Exam reveals no gallop, no S3 and no S4.  No murmur heard. Pulmonary/Chest: Effort normal and breath sounds normal. No stridor. She has  no wheezes. She has no rales.  Abdominal: Soft. Bowel sounds are normal. She exhibits no distension. There is no abdominal tenderness.  Obesity   Musculoskeletal:        General: No edema.     Cervical back: Neck supple.     Comments: Right radial site healing well.  No hematoma, mild ecchymosis.  No bruit.  Neurological: She is alert and oriented to person, place, and time. She has intact cranial nerves (2-12).  Skin: Skin is warm and moist.    Lab Results: Chemistry Recent Labs  Lab 08/05/22 0259 08/06/22 0141  NA 139 139  K 3.6 3.5  CL 103 106  CO2 23 24  GLUCOSE 158* 196*  BUN 17 12  CREATININE 0.94 0.89  CALCIUM 8.5* 8.2*  PROT 7.0  --   ALBUMIN 2.8*  --   AST 20  --   ALT 18  --   ALKPHOS 86  --   BILITOT 0.4  --   GFRNONAA >60 >60  ANIONGAP 13 9    Hematology Recent Labs  Lab 08/05/22 0259 08/06/22 0141  WBC 7.4 7.3  RBC 3.38* 3.03*  HGB 10.4* 9.4*  HCT 32.6* 29.6*  MCV 96.4 97.7  MCH 30.8 31.0  MCHC 31.9 31.8  RDW 13.2 13.3  PLT 265  250   High Sensitivity Troponin:   Recent Labs  Lab 08/05/22 0259 08/05/22 0516  TROPONINIHS 233* 271*     Cardiac EnzymesNo results for input(s): "TROPONINI" in the last 168 hours. No results for input(s): "TROPIPOC" in the last 168 hours.  BNPNo results for input(s): "BNP", "PROBNP" in the last 168 hours.  DDimer No results for input(s): "DDIMER" in the last 168 hours.  Hemoglobin A1c:  Lab Results  Component Value Date   HGBA1C 10.4 (H) 10/02/2021   MPG 251.78 10/02/2021   TSH No results for input(s): "TSH" in the last 8760 hours. Lipid Panel  Lab Results  Component Value Date   CHOL 119 08/06/2022   HDL 29 (L) 08/06/2022   LDLCALC 57 08/06/2022   LDLDIRECT 61 08/06/2022   TRIG 165 (H) 08/06/2022   CHOLHDL 4.1 08/06/2022   Drugs of Abuse  No results found for: "LABOPIA", "COCAINSCRNUR", "LABBENZ", "AMPHETMU", "THCU", "LABBARB"    Imaging: CARDIAC CATHETERIZATION  Addendum Date: 08/06/2022    Coronary angiogram 08/24/2022: LV hemodynamics: LV 162/6, EDP 16 mmHg.  Ao 165/78, mean 180 mmHg.  No pressure gradient across the aortic valve. LM: Large-caliber vessel, mildly calcified. LCx: Large-caliber vessel, has acute angle takeoff, moderate disease is noted in the proximal segment, 20 to 30% calcific stenosis.  AV groove has a ostial 70 to 80% followed by 70 to 80% stenosis in the proximal segment.  Large OM 2 with a high-grade 80 to 90% hazy ulcerated stenosis. LAD: Large-caliber vessel, has long stents noted in the proximal and mid LAD placed on 01/06/2021 (2 overlapping 2.75 x 12 and 2.5 x 26 millimeter Onyx frontier DES postdilated in the ostium at 3.5 mm [place] balloon) are widely patent, mid to distal LAD is diffusely diseased.  Moderate amount of calcification is evident.  There is a large D1 and distal to the stent D2 with mild disease. RCA: Dominant,.  Large-caliber vessel.  Gives origin to large PDA and large PL branch.  PL branch has a hazy 80% stenosis in the midsegment.  Intervention data: Difficult procedure, successful PTCA and stenting of the mid circumflex after the origin of AV groove branch, a 3.0 x 15 mm frontier Onyx deployed at 12 atm pressure for 60 seconds.  Stenosis reduced from 80% to 0%.  No edge dissection.  Successful stenting of the PL branch of RCA with a 3.0 x 16 mm Synergy XD DES deployed at 13 atm pressure for 60 seconds, stenosis reduced from 80% stenosis to 0% stenosis with TIMI II to TIMI-3 flow.  Recommendation: Patient will be discharged home once stable from medical standpoint, triple therapy with aspirin for 4 weeks and Plavix 75 mg mg p.o. daily for 6 months to a year along with Eliquis in view of ACS.  93 mL contrast utilized.  Result Date: 08/06/2022 Coronary angiogram 08/24/2022: LV hemodynamics: LV 162/6, EDP 16 mmHg.  Ao 165/78, mean 180 mmHg.  No pressure gradient across the aortic valve. LM: Large-caliber vessel, mildly calcified. LCx: Large-caliber vessel, has  acute angle takeoff, moderate disease is noted in the proximal segment, 20 to 30% calcific stenosis.  AV groove has a ostial 70 to 80% followed by 70 to 80% stenosis in the proximal segment.  Large OM 2 with a high-grade 80 to 90% hazy ulcerated stenosis. LAD: Large-caliber vessel, has long stents noted in the proximal and mid LAD placed on 01/06/2021 (2 overlapping 2.75 x 12 and 2.5 x 26 millimeter Onyx frontier DES postdilated in the ostium at  3.5 mm [place] balloon) are widely patent, mid to distal LAD is diffusely diseased.  Moderate amount of calcification is evident.  There is a large D1 and distal to the stent D2 with mild disease. RCA: Dominant,.  Large-caliber vessel.  Gives origin to large PDA and large PL branch.  PL branch has a hazy 80% stenosis in the midsegment.  Intervention data: Difficult procedure, successful PTCA and stenting of the mid circumflex after the origin of AV groove branch, a 3.0 x 15 mm frontier Onyx deployed at 12 atm pressure for 60 seconds.  Stenosis reduced from 80% to 0%.  No edge dissection.  Successful stenting of the PL branch with a 3.0 x 16 mm Synergy XD DES deployed at 13 atm pressure for 60 seconds, stenosis reduced from 80% stenosis to 0% stenosis with TIMI II to TIMI-3 flow.  Recommendation: Patient will be discharged home once stable from medical standpoint, triple therapy with aspirin for 4 weeks and Plavix 75 mg mg p.o. daily for 6 months to a year along with Eliquis in view of ACS.  93 mL contrast utilized.    CARDIAC DATABASE: EKG: August 05, 2022: Sinus rhythm, 69 bpm, consider anteroseptal infarct, without underlying injury pattern    Echocardiogram: 02/01/2021: LVEF 30-35% see report for additional details   10/03/2021  1. Left ventricular ejection fraction, by estimation, is 60 to 65%. The  left ventricle has normal function. The left ventricle demonstrates  regional wall motion abnormalities (see scoring diagram/findings for  description). There is  moderate concentric  left ventricular hypertrophy. Left ventricular diastolic parameters are  indeterminate.   2. Right ventricular systolic function is normal. The right ventricular  size is normal. There is normal pulmonary artery systolic pressure. The  estimated right ventricular systolic pressure is 19.6 mmHg.   3. Left atrial size was moderately dilated.   4. The mitral valve is abnormal. Trivial mitral valve regurgitation.   5. The aortic valve is tricuspid. Aortic valve regurgitation is not  visualized. Aortic valve sclerosis/calcification is present, without any  evidence of aortic stenosis.   6. The inferior vena cava is normal in size with greater than 50%  respiratory variability, suggesting right atrial pressure of 3 mmHg.    Recent echo at [facility] - no report sent w/ the records. But documentation noted. LVEF of 50-55% with distal inferior/posterior, inferoseptal, anteroseptal region hypokinetic at the apical levels per documentation  Heart catheterization: 08/05/2022 Coronary angiogram 08/24/2022: LV hemodynamics: LV 162/6, EDP 16 mmHg.  Ao 165/78, mean 180 mmHg.  No pressure gradient across the aortic valve. LM: Large-caliber vessel, mildly calcified. LCx: Large-caliber vessel, has acute angle takeoff, moderate disease is noted in the proximal segment, 20 to 30% calcific stenosis.  AV groove has a ostial 70 to 80% followed by 70 to 80% stenosis in the proximal segment.  Large OM 2 with a high-grade 80 to 90% hazy ulcerated stenosis. LAD: Large-caliber vessel, has long stents noted in the proximal and mid LAD placed on 01/06/2021 (2 overlapping 2.75 x 12 and 2.5 x 26 millimeter Onyx frontier DES postdilated in the ostium at 3.5 mm [place] balloon) are widely patent, mid to distal LAD is diffusely diseased.  Moderate amount of calcification is evident.  There is a large D1 and distal to the stent D2 with mild disease. RCA: Dominant,.  Large-caliber vessel.  Gives origin to  large PDA and large PL branch.  PL branch has a hazy 80% stenosis in the midsegment.   Intervention  data: Difficult procedure, successful PTCA and stenting of the mid circumflex after the origin of AV groove branch, a 3.0 x 15 mm frontier Onyx deployed at 12 atm pressure for 60 seconds.  Stenosis reduced from 80% to 0%.  No edge dissection.      Successful stenting of the PL branch of RCA with a 3.0 x 16 mm Synergy XD DES deployed at 13 atm pressure for 60 seconds, stenosis reduced from 80% stenosis to 0% stenosis with TIMI II to TIMI-3 flow.   Recommendation: Patient will be discharged home once stable from medical standpoint, triple therapy with aspirin for 4 weeks and Plavix 75 mg mg p.o. daily for 6 months to a year along with Eliquis in view of ACS.  93 mL contrast utilized.    Scheduled Meds:  apixaban  5 mg Oral BID   aspirin EC  81 mg Oral Daily   atorvastatin  40 mg Oral Daily   cefdinir  300 mg Oral Q12H   clopidogrel  75 mg Oral Q breakfast   ezetimibe  10 mg Oral Daily   furosemide  40 mg Intravenous Once   icosapent Ethyl  2 g Oral BID   insulin aspart  0-9 Units Subcutaneous Q4H   metoprolol succinate  12.5 mg Oral Daily   potassium chloride  40 mEq Oral BID   sacubitril-valsartan  1 tablet Oral BID   sodium chloride flush  3 mL Intravenous Q12H   sodium chloride flush  3 mL Intravenous Q12H   spironolactone  25 mg Oral Daily    Continuous Infusions:  sodium chloride      PRN Meds: sodium chloride, acetaminophen **OR** acetaminophen, ondansetron **OR** ondansetron (ZOFRAN) IV, senna-docusate, sodium chloride flush   IMPRESSION & RECOMMENDATIONS: Mallory Butler is a 72 y.o. Caucasian female whose past medical history and cardiac risk factors include: Heart failure with improved EF, hypertension, hyperlipidemia, diabetes mellitus type 2, CAD status post NSTEMI (January 2023) in the setting of COVID-19, status post PCI to the proximal /mid LAD, paroxysmal A-fib with  RVR.   Impression: Coronary artery disease status post PCI mid LCx and PLV branch. Diabetes mellitus type 2 with hyperglycemia. Hypertension. Hyperlipidemia. Paroxysmal atrial fibrillation  Recommendations/medical decision making: Presented to [facility] in acute DKA and urinary tract infection and troponins peaked at 1.9.  Echocardiogram noted low normal LVEF with regional wall motion abnormalities.  After addressing the acute medical conditions that she was transferred to [facility] for cardiac evaluation given her strong history of heart failure with improved EF and coronary disease with prior PCI to the distal left main/LAD.  Though patient was not having anginal chest pain she was experiencing dyspnea on exertion which was felt to be her anginal equivalents.  She underwent angiography and was noted to have obstructive disease in the LCx and PLB distribution and underwent coronary intervention as outlined above.  Postprocedure she is doing well shortness of breath is improving.  No significant arrhythmia on telemetry overnight.  Right radial site is healing well without any postprocedure complications.  Triple therapy with aspirin for 4 weeks and Plavix 75 mg mg p.o. daily for 6 months to a year along with Eliquis in view of ACS  Educated her on the importance of improving her modifiable cardiovascular risk factors and secondary prevention.  Patient states that she is already on Ozempic which is being monitored by PCP she is at 1 mg dose once a week. Her triglycerides are still not well-controlled-will start Vascepa.  Given her history of HFrEF she was on Entresto, Farxiga, spironolactone since then a lot of these medications have been discontinued either due to renal function or urinary tract infections.  Will restart Entresto at a lower dose.  Patient is made aware that if antiplatelets or anticoagulation is to be held for procedures or surgeries should reach out to the office  for further guidance.  Patient is stable from a cardiovascular standpoint to be discharged home.  Will arrange outpatient follow-up visit.  Patient's questions and concerns were addressed to her satisfaction. She voices understanding of the instructions provided during this encounter.   This note was created using a voice recognition software as a result there may be grammatical errors inadvertently enclosed that do not reflect the nature of this encounter. Every attempt is made to correct such errors.  Delilah Shan [facility]  Pager:  [phone] Office: [phone] 08/06/2022, 8:54 AM

## 2022-08-07 ENCOUNTER — Other Ambulatory Visit (HOSPITAL_COMMUNITY): Payer: Self-pay

## 2022-08-07 LAB — GLUCOSE, CAPILLARY
Glucose-Capillary: 212 mg/dL — ABNORMAL HIGH (ref 70–99)
Glucose-Capillary: 303 mg/dL — ABNORMAL HIGH (ref 70–99)

## 2022-08-07 MED ORDER — CLOPIDOGREL BISULFATE 75 MG PO TABS
75.0000 mg | ORAL_TABLET | Freq: Every day | ORAL | 1 refills | Status: AC
  Filled 2022-08-07: qty 90, 90d supply, fill #0

## 2022-08-07 MED ORDER — SACUBITRIL-VALSARTAN 24-26 MG PO TABS
1.0000 | ORAL_TABLET | Freq: Two times a day (BID) | ORAL | 0 refills | Status: DC
Start: 2022-08-07 — End: 2022-09-03
  Filled 2022-08-07: qty 60, 30d supply, fill #0

## 2022-08-07 MED ORDER — APIXABAN 5 MG PO TABS
5.0000 mg | ORAL_TABLET | Freq: Two times a day (BID) | ORAL | 1 refills | Status: AC
Start: 2022-08-07 — End: ?
  Filled 2022-08-07: qty 60, 30d supply, fill #0

## 2022-08-07 MED ORDER — CEFDINIR 300 MG PO CAPS
300.0000 mg | ORAL_CAPSULE | Freq: Two times a day (BID) | ORAL | 0 refills | Status: AC
  Filled 2022-08-07: qty 6, 3d supply, fill #0

## 2022-08-07 MED ORDER — ASPIRIN 81 MG PO TBEC
81.0000 mg | DELAYED_RELEASE_TABLET | Freq: Every day | ORAL | 0 refills | Status: AC
  Filled 2022-08-07: qty 120, 120d supply, fill #0

## 2022-08-07 MED ORDER — SPIRONOLACTONE 25 MG PO TABS
25.0000 mg | ORAL_TABLET | Freq: Every day | ORAL | 0 refills | Status: DC
Start: 2022-08-07 — End: 2022-08-27
  Filled 2022-08-07: qty 28, 28d supply, fill #0

## 2022-08-07 NOTE — Progress Notes (Signed)
Pt received at bedside with primary RN. RN is reviewing discharge with pt. Pt voices she has no questions in regards to the cardiac rehab education she received. Reminded patient that a referral for cardiac rehab phase 2 will be stent to [facility].   Lorin Picket MS, ACSM-CEP, CCRP  11:00 - 11:08

## 2022-08-07 NOTE — Plan of Care (Signed)
  Problem: Education: Goal: Knowledge of General Education information will improve Description: Including pain rating scale, medication(s)/side effects and non-pharmacologic comfort measures 08/07/2022 0825 by Vikki Ports, RN Outcome: Adequate for Discharge 08/07/2022 0825 by Vikki Ports, RN Outcome: Progressing   Problem: Health Behavior/Discharge Planning: Goal: Ability to manage health-related needs will improve 08/07/2022 0825 by Vikki Ports, RN Outcome: Adequate for Discharge 08/07/2022 0825 by Vikki Ports, RN Outcome: Progressing   Problem: Clinical Measurements: Goal: Ability to maintain clinical measurements within normal limits will improve 08/07/2022 0825 by Vikki Ports, RN Outcome: Adequate for Discharge 08/07/2022 0825 by Vikki Ports, RN Outcome: Progressing Goal: Will remain free from infection 08/07/2022 0825 by Vikki Ports, RN Outcome: Adequate for Discharge 08/07/2022 0825 by Vikki Ports, RN Outcome: Progressing Goal: Diagnostic test results will improve 08/07/2022 0825 by Vikki Ports, RN Outcome: Adequate for Discharge 08/07/2022 0825 by Vikki Ports, RN Outcome: Progressing Goal: Respiratory complications will improve 08/07/2022 0825 by Vikki Ports, RN Outcome: Adequate for Discharge 08/07/2022 0825 by Vikki Ports, RN Outcome: Progressing Goal: Cardiovascular complication will be avoided 08/07/2022 0825 by Vikki Ports, RN Outcome: Adequate for Discharge 08/07/2022 0825 by Vikki Ports, RN Outcome: Progressing   Problem: Clinical Measurements: Goal: Will remain free from infection 08/07/2022 0825 by Vikki Ports, RN Outcome: Adequate for Discharge 08/07/2022 0825 by Vikki Ports, RN Outcome: Progressing   Problem: Clinical Measurements: Goal: Diagnostic test results will improve 08/07/2022 0825 by Vikki Ports, RN Outcome: Adequate for  Discharge 08/07/2022 0825 by Vikki Ports, RN Outcome: Progressing

## 2022-08-11 ENCOUNTER — Encounter: Payer: Self-pay | Admitting: Cardiology

## 2022-08-12 ENCOUNTER — Telehealth (HOSPITAL_COMMUNITY): Payer: Self-pay

## 2022-08-12 NOTE — Telephone Encounter (Signed)
Per phase I cardiac rehab, fax referral to [place].

## 2022-08-18 NOTE — Telephone Encounter (Signed)
done

## 2022-08-20 NOTE — Discharge Summary (Signed)
Physician Discharge Summary  Mallory Butler ZOX:[id number] DOB: April 19, 1950 DOA: 08/04/2022  PCP: Alinda Deem, MD  Admit date: 08/04/2022 Discharge date: 08/07/2022  Time spent: 45 minutes  Recommendations for Outpatient Follow-up:  Cardiology Dr. Odis Hollingshead in 2 weeks BMP in 1 week   Discharge Diagnoses:  Principal Problem:   NSTEMI Chronic systolic CHF   Chronic combined systolic and diastolic CHF (congestive heart failure) (HCC)   Type 2 diabetes mellitus (HCC)   Hyperlipidemia associated with type 2 diabetes mellitus (HCC)   Non-ST elevation (NSTEMI) myocardial infarction [facility])   Coronary artery disease involving native coronary artery of native heart with unstable angina pectoris (HCC)   Paroxysmal atrial fibrillation (HCC)   Anginal equivalent   Benign hypertension   Atherosclerotic heart disease   S/P angioplasty with stent   Hypertriglyceridemia   Coronary artery disease involving native coronary artery of native heart without angina pectoris   Discharge Condition: improved  Diet recommendation: DM, [facility]  Filed Weights   08/05/22 0459 08/06/22 0219 08/07/22 0500  Weight: 102 kg 101.8 kg 99.6 kg    History of present illness:  71/F with history of CAD, multiple PCI and stents in 1/23, paroxysmal A-fib, hypertension, diabetes mellitus was recently hospitalized at [facility] in [place] for obstructing left ureteral stone associated with septic shock and E. coli bacteremia, stabilized on antibiotics, underwent left ureteral stenting 7/7, at that time had an echo done which noted EF of 30-35% she was started on Jardiance and metoprolol, and home Sherryll Burger was continued. -She presented to [facility] Sunday night 7/28 with severe fatigue and weakness noted to be in DKA with elevated troponins also found to have E. coli UTI. -Troponin was elevated 0.7> 1.9> 1.28. -Workup in [facility] noted CT urogram 7/29 which noted left ureteral stent with mild residual left  hydronephrosis, nonobstructive left renal calculus, urine culture from 7/29 grew E. Coli, seen by cardiology at [facility] for elevated troponin, echo noted wall motion abnormality at outside hospital and transfer was requested to [facility], accepted by Dr. Esperanza Heir Course:   NSTEMI/Elevated troponin CAD s/p PCI with DES to LAD 01/2021: Transfer from [facility] for evaluation of elevated troponin (0.74 > 1.9 > 1.28).   -Echo this week at [facility] noted wall motion abnormalities [facility] cardiology consulted, continue aspirin, statin, Zetia, started Toprol  -underwent LHC per Dr.Ganji 8.1: sp PCI and stenting  mid circumflex and  PL branch of RCA -continue ASA x4 weeks, plavix x55mos and eliquis long term -FU with Dr.Tolia in 2 weeks   Acute on chronic systolic CHF  EF 81-19% by TTE 07/13/2022.   -Appears volume overloaded, started Aldactone yesterday -Avoid SGLT2i with recent UTI -IV Lasix x 1, starting Entresto at low-dose -BMP in a.m.   Paroxysmal atrial fibrillation: -Starting Toprol, continue Eliquis.   Type 2 diabetes: Reportedly treated for DKA at [facility] now transitioned to subcutaneous insulin.   UTI: Urine culture 7/29 grew E. Coli at [facility] -Continue cefdinir X 5 days of antibiotics total   Left ureteral stone s/p stenting 07/11/2022: CT urogram 7/29 shows stent in place with mild left hydronephrosis. -Follow-up with urology next week at [facility] for definitive management of stone   Hyperlipidemia: Continue atorvastatin and Zetia.  Discharge Exam: Vitals:   08/07/22 0500 08/07/22 0748  BP:  120/79  Pulse: 72 73  Resp: 10 14  Temp:  99 F (37.2 C)  SpO2: 98% 95%   General exam: Obese pleasant female sitting up in bed, AAOx3  HEENT: Positive JVD CVS: S1-S2, regular rhythm Lungs: Few basilar Rales otherwise clear Abdomen: Soft, nontender, bowel sounds present Extremities: No edema Skin: No rashes Psychiatry:  Mood & affect appropriate.      Discharge Instructions   Discharge Instructions     Amb Referral to Cardiac Rehabilitation   Complete by: As directed    Diagnosis: Coronary Stents   After initial evaluation and assessments completed: Virtual Based Care may be provided alone or in conjunction with Phase 2 Cardiac Rehab based on patient barriers.: Yes   Intensive Cardiac Rehabilitation (ICR) [facility] location only OR Traditional Cardiac Rehabilitation (TCR) *If criteria for ICR are not met will enroll in TCR [facility] only): Yes   Diet - low sodium heart healthy   Complete by: As directed    Diet Carb Modified   Complete by: As directed    Increase activity slowly   Complete by: As directed       Allergies as of 08/07/2022   No Known Allergies      Medication List     STOP taking these medications    ciprofloxacin 500 MG tablet Commonly known as: CIPRO   Entresto 97-103 MG Generic drug: sacubitril-valsartan Replaced by: Sherryll Burger 24-26 MG       TAKE these medications    aspirin EC 81 MG tablet Take 1 tablet (81 mg total) by mouth daily for 28 days. Swallow whole.   atorvastatin 40 MG tablet Commonly known as: LIPITOR TAKE 1 TABLET BY MOUTH EVERYDAY AT BEDTIME What changed: See the new instructions.   bisoprolol 10 MG tablet Commonly known as: ZEBETA Take 10 mg by mouth daily.   buPROPion 150 MG 24 hr tablet Commonly known as: WELLBUTRIN XL Take 150 mg by mouth daily.   clopidogrel 75 MG tablet Commonly known as: PLAVIX Take 1 tablet (75 mg total) by mouth daily for 6 months.   Eliquis 5 MG Tabs tablet Generic drug: apixaban Take 1 tablet (5 mg total) by mouth 2 (two) times daily.   Entresto 24-26 MG Generic drug: sacubitril-valsartan Take 1 tablet by mouth 2 (two) times daily. Replaces: Entresto 97-103 MG   ezetimibe 10 MG tablet Commonly known as: ZETIA TAKE 1 TABLET BY MOUTH EVERY DAY   gabapentin 300 MG capsule Commonly known as: NEURONTIN Take 1 capsule (300 mg total) by mouth  daily after supper.   glipiZIDE 10 MG tablet Commonly known as: GLUCOTROL Take 10 mg by mouth 2 (two) times daily before a meal.   hydrOXYzine 10 MG tablet Commonly known as: ATARAX Take 10-20 mg by mouth every 8 (eight) hours as needed for anxiety or itching.   insulin aspart 100 UNIT/ML injection Commonly known as: novoLOG Inject 0-15 Units into the skin 3 (three) times daily with meals.   insulin aspart 100 UNIT/ML injection Commonly known as: novoLOG Inject 0-5 Units into the skin at bedtime.   insulin glargine-yfgn 100 UNIT/ML injection Commonly known as: SEMGLEE Inject 0.35 mLs (35 Units total) into the skin 2 (two) times daily. What changed:  how much to take when to take this additional instructions   levothyroxine 88 MCG tablet Commonly known as: SYNTHROID Take 88 mcg by mouth daily before breakfast.   metFORMIN 1000 MG tablet Commonly known as: GLUCOPHAGE Take 1,000 mg by mouth 2 (two) times daily with a meal.   metFORMIN 500 MG 24 hr tablet Commonly known as: GLUCOPHAGE-XR Take 1,000 mg by mouth 2 (two) times daily.   metoprolol succinate 25 MG 24 hr  tablet Commonly known as: TOPROL-XL Take 25 mg by mouth daily.   multivitamin capsule Take 1 capsule by mouth daily.   OneTouch Ultra test strip Generic drug: glucose blood USE TO TEST 3 TIMES DAILY   oxybutynin 15 MG 24 hr tablet Commonly known as: DITROPAN XL Take 15 mg by mouth at bedtime.   oxyCODONE 5 MG immediate release tablet Commonly known as: Oxy IR/ROXICODONE Take 5-10 mg by mouth every 6 (six) hours as needed for severe pain.   Ozempic (2 MG/DOSE) 8 MG/3ML Sopn Generic drug: Semaglutide (2 MG/DOSE) Inject 2 mg into the skin once a week.   prednisoLONE acetate 1 % ophthalmic suspension Commonly known as: PRED FORTE Place 3 drops into the right eye daily.   spironolactone 25 MG tablet Commonly known as: ALDACTONE Take 1 tablet (25 mg total) by mouth daily. What changed: how much to  take   tamsulosin 0.4 MG Caps capsule Commonly known as: FLOMAX Take 0.4 mg by mouth daily.   torsemide 10 MG tablet Commonly known as: DEMADEX Take 10 mg by mouth daily.   venlafaxine 75 MG tablet Commonly known as: EFFEXOR Take 225 mg by mouth daily.   venlafaxine XR 150 MG 24 hr capsule Commonly known as: EFFEXOR-XR Take 150 mg by mouth daily with breakfast.       ASK your doctor about these medications    cefdinir 300 MG capsule Commonly known as: OMNICEF Take 1 capsule (300 mg total) by mouth every 12 (twelve) hours for 3 days. Ask about: Should I take this medication?       No Known Allergies    The results of significant diagnostics from this hospitalization (including imaging, microbiology, ancillary and laboratory) are listed below for reference.    Significant Diagnostic Studies: CARDIAC CATHETERIZATION  Addendum Date: 08/06/2022   Coronary angiogram 08/24/2022: LV hemodynamics: LV 162/6, EDP 16 mmHg.  Ao 165/78, mean 180 mmHg.  No pressure gradient across the aortic valve. LM: Large-caliber vessel, mildly calcified. LCx: Large-caliber vessel, has acute angle takeoff, moderate disease is noted in the proximal segment, 20 to 30% calcific stenosis.  AV groove has a ostial 70 to 80% followed by 70 to 80% stenosis in the proximal segment.  Large OM 2 with a high-grade 80 to 90% hazy ulcerated stenosis. LAD: Large-caliber vessel, has long stents noted in the proximal and mid LAD placed on 01/06/2021 (2 overlapping 2.75 x 12 and 2.5 x 26 millimeter Onyx frontier DES postdilated in the ostium at 3.5 mm [place] balloon) are widely patent, mid to distal LAD is diffusely diseased.  Moderate amount of calcification is evident.  There is a large D1 and distal to the stent D2 with mild disease. RCA: Dominant,.  Large-caliber vessel.  Gives origin to large PDA and large PL branch.  PL branch has a hazy 80% stenosis in the midsegment.  Intervention data: Difficult procedure, successful PTCA  and stenting of the mid circumflex after the origin of AV groove branch, a 3.0 x 15 mm frontier Onyx deployed at 12 atm pressure for 60 seconds.  Stenosis reduced from 80% to 0%.  No edge dissection.  Successful stenting of the PL branch of RCA with a 3.0 x 16 mm Synergy XD DES deployed at 13 atm pressure for 60 seconds, stenosis reduced from 80% stenosis to 0% stenosis with TIMI II to TIMI-3 flow.  Recommendation: Patient will be discharged home once stable from medical standpoint, triple therapy with aspirin for 4 weeks and Plavix 75 mg  mg p.o. daily for 6 months to a year along with Eliquis in view of ACS.  93 mL contrast utilized.  Result Date: 08/06/2022 Coronary angiogram 08/24/2022: LV hemodynamics: LV 162/6, EDP 16 mmHg.  Ao 165/78, mean 180 mmHg.  No pressure gradient across the aortic valve. LM: Large-caliber vessel, mildly calcified. LCx: Large-caliber vessel, has acute angle takeoff, moderate disease is noted in the proximal segment, 20 to 30% calcific stenosis.  AV groove has a ostial 70 to 80% followed by 70 to 80% stenosis in the proximal segment.  Large OM 2 with a high-grade 80 to 90% hazy ulcerated stenosis. LAD: Large-caliber vessel, has long stents noted in the proximal and mid LAD placed on 01/06/2021 (2 overlapping 2.75 x 12 and 2.5 x 26 millimeter Onyx frontier DES postdilated in the ostium at 3.5 mm [place] balloon) are widely patent, mid to distal LAD is diffusely diseased.  Moderate amount of calcification is evident.  There is a large D1 and distal to the stent D2 with mild disease. RCA: Dominant,.  Large-caliber vessel.  Gives origin to large PDA and large PL branch.  PL branch has a hazy 80% stenosis in the midsegment.  Intervention data: Difficult procedure, successful PTCA and stenting of the mid circumflex after the origin of AV groove branch, a 3.0 x 15 mm frontier Onyx deployed at 12 atm pressure for 60 seconds.  Stenosis reduced from 80% to 0%.  No edge dissection.  Successful stenting of  the PL branch with a 3.0 x 16 mm Synergy XD DES deployed at 13 atm pressure for 60 seconds, stenosis reduced from 80% stenosis to 0% stenosis with TIMI II to TIMI-3 flow.  Recommendation: Patient will be discharged home once stable from medical standpoint, triple therapy with aspirin for 4 weeks and Plavix 75 mg mg p.o. daily for 6 months to a year along with Eliquis in view of ACS.  93 mL contrast utilized.    Microbiology: No results found for this or any previous visit (from the past 240 hour(s)).   Labs: Basic Metabolic Panel: No results for input(s): "NA", "K", "CL", "CO2", "GLUCOSE", "BUN", "CREATININE", "CALCIUM", "MG", "PHOS" in the last 168 hours. Liver Function Tests: No results for input(s): "AST", "ALT", "ALKPHOS", "BILITOT", "PROT", "ALBUMIN" in the last 168 hours. No results for input(s): "LIPASE", "AMYLASE" in the last 168 hours. No results for input(s): "AMMONIA" in the last 168 hours. CBC: No results for input(s): "WBC", "NEUTROABS", "HGB", "HCT", "MCV", "PLT" in the last 168 hours. Cardiac Enzymes: No results for input(s): "CKTOTAL", "CKMB", "CKMBINDEX", "TROPONINI" in the last 168 hours. BNP: BNP (last 3 results) No results for input(s): "BNP" in the last 8760 hours.  ProBNP (last 3 results) No results for input(s): "PROBNP" in the last 8760 hours.  CBG: No results for input(s): "GLUCAP" in the last 168 hours.     Signed:  Zannie Cove MD.  [facility] 08/20/2022, 5:27 PM

## 2022-08-23 ENCOUNTER — Ambulatory Visit: Payer: Medicare Other | Admitting: Cardiology

## 2022-08-27 ENCOUNTER — Other Ambulatory Visit: Payer: Self-pay | Admitting: Cardiology

## 2022-09-03 ENCOUNTER — Ambulatory Visit: Payer: Medicare Other | Admitting: Cardiology

## 2022-09-03 ENCOUNTER — Encounter: Payer: Self-pay | Admitting: Cardiology

## 2022-09-03 VITALS — BP 129/72 | HR 66 | Resp 16 | Ht 63.0 in | Wt 220.0 lb

## 2022-09-03 DIAGNOSIS — E039 Hypothyroidism, unspecified: Secondary | ICD-10-CM | POA: Diagnosis not present

## 2022-09-03 DIAGNOSIS — I2510 Atherosclerotic heart disease of native coronary artery without angina pectoris: Secondary | ICD-10-CM | POA: Diagnosis not present

## 2022-09-03 DIAGNOSIS — Z794 Long term (current) use of insulin: Secondary | ICD-10-CM | POA: Diagnosis not present

## 2022-09-03 DIAGNOSIS — I5032 Chronic diastolic (congestive) heart failure: Secondary | ICD-10-CM

## 2022-09-03 DIAGNOSIS — Z955 Presence of coronary angioplasty implant and graft: Secondary | ICD-10-CM

## 2022-09-03 DIAGNOSIS — E782 Mixed hyperlipidemia: Secondary | ICD-10-CM | POA: Diagnosis not present

## 2022-09-03 DIAGNOSIS — Z7901 Long term (current) use of anticoagulants: Secondary | ICD-10-CM

## 2022-09-03 DIAGNOSIS — E1165 Type 2 diabetes mellitus with hyperglycemia: Secondary | ICD-10-CM | POA: Diagnosis not present

## 2022-09-03 DIAGNOSIS — I480 Paroxysmal atrial fibrillation: Secondary | ICD-10-CM

## 2022-09-03 DIAGNOSIS — E1121 Type 2 diabetes mellitus with diabetic nephropathy: Secondary | ICD-10-CM | POA: Diagnosis not present

## 2022-09-03 DIAGNOSIS — I252 Old myocardial infarction: Secondary | ICD-10-CM | POA: Diagnosis not present

## 2022-09-03 DIAGNOSIS — E781 Pure hyperglyceridemia: Secondary | ICD-10-CM

## 2022-09-03 DIAGNOSIS — I10 Essential (primary) hypertension: Secondary | ICD-10-CM | POA: Diagnosis not present

## 2022-09-03 DIAGNOSIS — E1140 Type 2 diabetes mellitus with diabetic neuropathy, unspecified: Secondary | ICD-10-CM | POA: Diagnosis not present

## 2022-09-03 DIAGNOSIS — Z79899 Other long term (current) drug therapy: Secondary | ICD-10-CM | POA: Diagnosis not present

## 2022-09-03 MED ORDER — TORSEMIDE 10 MG PO TABS
10.0000 mg | ORAL_TABLET | Freq: Every morning | ORAL | 0 refills | Status: DC
Start: 2022-09-03 — End: 2022-09-22

## 2022-09-03 MED ORDER — EZETIMIBE 10 MG PO TABS
10.0000 mg | ORAL_TABLET | Freq: Every day | ORAL | 0 refills | Status: DC
Start: 2022-09-03 — End: 2022-10-13

## 2022-09-03 MED ORDER — ATORVASTATIN CALCIUM 40 MG PO TABS
40.0000 mg | ORAL_TABLET | Freq: Every evening | ORAL | 1 refills | Status: DC
Start: 2022-09-03 — End: 2023-01-31

## 2022-09-03 MED ORDER — SACUBITRIL-VALSARTAN 24-26 MG PO TABS
1.0000 | ORAL_TABLET | Freq: Two times a day (BID) | ORAL | 1 refills | Status: AC
Start: 2022-09-03 — End: 2023-03-02

## 2022-09-03 MED ORDER — VASCEPA 1 G PO CAPS
2.0000 g | ORAL_CAPSULE | Freq: Two times a day (BID) | ORAL | 1 refills | Status: AC
Start: 2022-09-03 — End: 2023-03-02

## 2022-09-03 MED ORDER — SPIRONOLACTONE 25 MG PO TABS
25.0000 mg | ORAL_TABLET | Freq: Every day | ORAL | 0 refills | Status: DC
Start: 2022-09-03 — End: 2023-01-12

## 2022-09-03 MED ORDER — METOPROLOL SUCCINATE ER 25 MG PO TB24
25.0000 mg | ORAL_TABLET | Freq: Every day | ORAL | 1 refills | Status: DC
Start: 2022-09-03 — End: 2023-03-02

## 2022-09-03 NOTE — Progress Notes (Signed)
ID:  Mallory Butler, DOB 28-Oct-1950, MRN [id number]  PCP:  Alinda Deem, MD  Cardiologist:  Tessa Lerner, DO, [facility] (established care 02/01/2021)  Date: 09/03/22 Last Office Visit: 07/14/2021  Chief Complaint  Patient presents with   Hospitalization Follow-up    HPI  Mallory Butler is a 72 y.o. Caucasian female whose past medical history and cardiovascular risk factors include: Paroxysmal atrial fibrillation, CAD status post angioplasty/PCI (distal LM/proximal LAD, and mid LAD Jan 2023) (mid LCx PCI and PLB branch), ischemic cardiomyopathy, chronic systolic and diastolic heart failure, insulin-dependent diabetes mellitus type 2, hypertension, hyperlipidemia, postmenopausal female, obesity due to excess calories.  Patient is being followed by the practice for CAD and status post coronary intervention.  In January 2023 she presented with COVID-19 pneumonia and echocardiogram noted reduced LVEF with regional wall motion abnormalities.  She underwent angiography and noted to have obstructive disease in the distal left main/LAD distribution.  She underwent angioplasty and stenting to the distal left main/proximal LAD and mid LAD.  Patient was lost to follow-up until her recent hospitalization in August 2024 for DKA.  During that hospitalization her troponin levels were elevated and she was transferred from [facility] to [facility] for cardiovascular workup.  Echocardiogram noted mildly reduced LVEF with multiple regional wall motion abnormalities.  She underwent angiography and again was noted to have obstructive disease and underwent coronary intervention.  She presents today for follow-up.  She is accompanied by her friend Nedra Hai today and provides verbal consent with having her present during today's encounter. Since hospital discharge she has not had any anginal discomfort or heart failure symptoms.  She is compliant with triple therapy given her recent stents.  Patient is aware to come off  of aspirin as of September 07, 2022.   FUNCTIONAL STATUS: No structured exercise program or daily routine.   ALLERGIES: No Known Allergies  MEDICATION LIST PRIOR TO VISIT: Current Meds  Medication Sig   apixaban (ELIQUIS) 5 MG TABS tablet Take 1 tablet (5 mg total) by mouth 2 (two) times daily.   aspirin EC 81 MG tablet Take 1 tablet (81 mg total) by mouth daily for 28 days. Swallow whole.   buPROPion (WELLBUTRIN XL) 150 MG 24 hr tablet Take 150 mg by mouth daily.   clopidogrel (PLAVIX) 75 MG tablet Take 1 tablet (75 mg total) by mouth daily for 6 months.   glipiZIDE (GLUCOTROL) 10 MG tablet Take 10 mg by mouth 2 (two) times daily before a meal.   glucose blood (ONETOUCH ULTRA) test strip USE TO TEST 3 TIMES DAILY   hydrOXYzine (ATARAX) 10 MG tablet Take 10-20 mg by mouth every 8 (eight) hours as needed for anxiety or itching.   insulin aspart (NOVOLOG) 100 UNIT/ML injection Inject 0-15 Units into the skin 3 (three) times daily with meals.   insulin aspart (NOVOLOG) 100 UNIT/ML injection Inject 0-5 Units into the skin at bedtime.   insulin glargine-yfgn (SEMGLEE) 100 UNIT/ML injection Inject 0.35 mLs (35 Units total) into the skin 2 (two) times daily. (Patient taking differently: Inject 30-35 Units into the skin See admin instructions. Inject 35 units into the skin in the morning and 30 units at bedtime)   levothyroxine (SYNTHROID) 88 MCG tablet Take 88 mcg by mouth daily before breakfast.   Multiple Vitamin (MULTIVITAMIN) capsule Take 1 capsule by mouth daily.   oxybutynin (DITROPAN XL) 15 MG 24 hr tablet Take 15 mg by mouth at bedtime.   oxyCODONE (OXY IR/ROXICODONE) 5 MG immediate  release tablet Take 5-10 mg by mouth every 6 (six) hours as needed for severe pain.   OZEMPIC, 2 MG/DOSE, 8 MG/3ML SOPN Inject 2 mg into the skin once a week.   prednisoLONE acetate (PRED FORTE) 1 % ophthalmic suspension Place 3 drops into the right eye daily.   tamsulosin (FLOMAX) 0.4 MG CAPS capsule Take 0.4  mg by mouth daily.   VASCEPA 1 g capsule Take 2 capsules (2 g total) by mouth 2 (two) times daily.   venlafaxine (EFFEXOR) 75 MG tablet Take 225 mg by mouth daily.   venlafaxine XR (EFFEXOR-XR) 150 MG 24 hr capsule Take 150 mg by mouth daily with breakfast.   [DISCONTINUED] atorvastatin (LIPITOR) 40 MG tablet TAKE 1 TABLET BY MOUTH EVERYDAY AT BEDTIME (Patient taking differently: Take 40 mg by mouth daily.)   [DISCONTINUED] bisoprolol (ZEBETA) 10 MG tablet Take 10 mg by mouth daily.   [DISCONTINUED] ezetimibe (ZETIA) 10 MG tablet TAKE 1 TABLET BY MOUTH EVERY DAY (Patient taking differently: Take 10 mg by mouth daily.)   [DISCONTINUED] metoprolol succinate (TOPROL-XL) 25 MG 24 hr tablet Take 25 mg by mouth daily.   [DISCONTINUED] sacubitril-valsartan (ENTRESTO) 24-26 MG Take 1 tablet by mouth 2 (two) times daily.   [DISCONTINUED] spironolactone (ALDACTONE) 25 MG tablet TAKE 1/2 TABLET BY MOUTH DAILY (Patient taking differently: Take 25 mg by mouth daily.)   [DISCONTINUED] torsemide (DEMADEX) 10 MG tablet Take 10 mg by mouth daily.     PAST MEDICAL HISTORY: Past Medical History:  Diagnosis Date   CHF (congestive heart failure) (HCC)    Coronary artery disease    COVID-19    Diabetes mellitus without complication (HCC)    Hyperlipidemia    NSTEMI (non-ST elevated myocardial infarction) (HCC)    Thyroid disease     PAST SURGICAL HISTORY: Past Surgical History:  Procedure Laterality Date   CARDIAC CATHETERIZATION     CORONARY STENT INTERVENTION N/A 02/02/2021   Procedure: CORONARY STENT INTERVENTION;  Surgeon: Yates Decamp, MD;  Location: [facility];  Service: Cardiovascular;  Laterality: N/A;   CORONARY STENT INTERVENTION N/A 08/05/2022   Procedure: CORONARY STENT INTERVENTION;  Surgeon: Yates Decamp, MD;  Location: [facility];  Service: Cardiovascular;  Laterality: N/A;   KNEE SURGERY Left    LEFT HEART CATH AND CORS/GRAFTS ANGIOGRAPHY N/A 08/05/2022   Procedure: LEFT  HEART CATH AND CORS/GRAFTS ANGIOGRAPHY;  Surgeon: Yates Decamp, MD;  Location: [facility];  Service: Cardiovascular;  Laterality: N/A;   RIGHT/LEFT HEART CATH AND CORONARY ANGIOGRAPHY N/A 02/02/2021   Procedure: RIGHT/LEFT HEART CATH AND CORONARY ANGIOGRAPHY;  Surgeon: Yates Decamp, MD;  Location: [facility];  Service: Cardiovascular;  Laterality: N/A;    FAMILY HISTORY: The patient family history includes Cancer in her mother; Heart attack in her father and sister; Hyperlipidemia in her mother; Hypertension in her father.  SOCIAL HISTORY:  The patient  reports that she quit smoking about 52 years ago. Her smoking use included cigarettes. She has never used smokeless tobacco. She reports that she does not currently use alcohol.  REVIEW OF SYSTEMS: Review of Systems  Constitutional: Positive for malaise/fatigue.  Cardiovascular:  Positive for dyspnea on exertion (chronic) and leg swelling (trace). Negative for chest pain, claudication, irregular heartbeat, near-syncope, orthopnea, palpitations, paroxysmal nocturnal dyspnea and syncope.  Respiratory:  Negative for shortness of breath.   Hematologic/Lymphatic: Negative for bleeding problem.  Musculoskeletal:  Negative for muscle cramps and myalgias.  Neurological:  Negative for dizziness and light-headedness.  PHYSICAL EXAM:    09/03/2022   11:18 AM 08/07/2022    7:48 AM 08/07/2022    5:00 AM  Vitals with BMI  Height 5\' 3"     Weight 220 lbs  219 lbs 9 oz  BMI 38.98  38.91  Systolic 129 120   Diastolic 72 79   Pulse 66 73 72    Physical Exam  Constitutional: She appears chronically ill.  Appears than stated age, presents in wheelchair, hemodynamically stable.   Neck: No JVD present.  Cardiovascular: Normal rate, regular rhythm, S1 normal, S2 normal, intact distal pulses and normal pulses. Exam reveals no gallop, no S3 and no S4.  No murmur heard. Pulmonary/Chest: Effort normal and breath sounds normal. No stridor. She  has no wheezes. She has no rales.  Abdominal: Soft. Bowel sounds are normal. She exhibits no distension. There is no abdominal tenderness.  Musculoskeletal:        General: Edema (trace b/l) present.     Cervical back: Neck supple.  Neurological: She is alert and oriented to person, place, and time. She has intact cranial nerves (2-12).  Skin: Skin is warm and dry.   CARDIAC DATABASE: EKG: 03/03/2021: NSR, 73 bpm, left axis, left anterior fascicular block, LVH per voltage criteria cannot rule out old anterior infarct, TWI suggestive of possible anterolateral ischemia or secondary to repolarization.  Echocardiogram: 02/01/2021: LVEF 30-35%, grade 1 diastolic dysfunction, regional wall motion abnormalities, estimated RAP 15 mmHg, see report for additional details  10/03/2021  1. Left ventricular ejection fraction, by estimation, is 60 to 65%. The  left ventricle has normal function. The left ventricle demonstrates  regional wall motion abnormalities (see scoring diagram/findings for  description). There is moderate concentric  left ventricular hypertrophy. Left ventricular diastolic parameters are  indeterminate.   2. Right ventricular systolic function is normal. The right ventricular  size is normal. There is normal pulmonary artery systolic pressure. The  estimated right ventricular systolic pressure is 19.6 mmHg.   3. Left atrial size was moderately dilated.   4. The mitral valve is abnormal. Trivial mitral valve regurgitation.   5. The aortic valve is tricuspid. Aortic valve regurgitation is not  visualized. Aortic valve sclerosis/calcification is present, without any  evidence of aortic stenosis.   6. The inferior vena cava is normal in size with greater than 50%  respiratory variability, suggesting right atrial pressure of 3 mmHg.    Recent echo at [facility] - no report sent w/ the records. But documentation noted. LVEF of 50-55% with distal inferior/posterior,  inferoseptal, anteroseptal region hypokinetic at the apical levels per documentation  Stress Testing: No results found for this or any previous visit from the past 1095 days.  Heart Catheterization: 02/02/2021:   Mid Cx lesion is 90% stenosed.   Ost Cx to Prox Cx lesion is 30% stenosed.   1st Diag lesion is 99% stenosed.   Prox LAD to Mid LAD lesion is 100% stenosed.   A drug-eluting stent was successfully placed using a STENT ONYX FRONTIER [id number].   A drug-eluting stent was successfully placed using a STENT ONYX FRONTIER [id number].   Post intervention, there is a 0% residual stenosis.   Right and left Heart Catheterization 02/02/21:   RA: 16/14, mean 14 mmHg RV 52/15, EDP 17 mmHg PA 50/22, mean 37 mmHg. PW 27/29, mean 24 mmHg. QP/QS 1.00.  CO 4.2, CI 2.02 by Fick.   LV: 112/15, EDP 29 mmHg.  Ao: 97/60, mean 76 mmHg.  There was no  pressure gradient across the aortic valve. LM: Mildly calcified but widely patent. LAD: Large vessel in the proximal segment, moderately calcified, occluded in the midsegment after the origin of a large diagonal 1 which has ostial 99% stenosis. CX: Large vessel, proximal segment has a 30% stenosis, large OM1 with a proximal 20 to 30% stenosis, AV groove has 90 to 95% stenosis in the proximal to mid segment. RCA: Large vessel, mild luminal irregularity.   Interventional data: Successful PTCA and stenting of the mid LAD with implantation of a 2.5 x 26 mm Onyx frontier DES, proximally into the proximal LAD and into the distal LM 82.75 x 12 mm Onyx frontier stent deployed postdilated with 3.5 x 8 mm [place] balloon at 20 atm pressure for 30 seconds x 2.  Complete expansion of the stent into the left main.  No sidebranch jailing of D1 which has ostial 99% stenosis.  TIMI 0 to TIMI-3 flow at the end of the procedure.  110 mL contrast utilized.  Recommendation: Continue aspirin indefinitely and Brilinta for at least a period of 1 year.  Medical therapy for circumflex  disease unless she has recurrence of angina pectoris.  Medical therapy for D1 disease.  Coronary angiogram 08/06/2022: LV hemodynamics: LV 162/6, EDP 16 mmHg.  Ao 165/78, mean 180 mmHg.  No pressure gradient across the aortic valve. LM: Large-caliber vessel, mildly calcified. LCx: Large-caliber vessel, has acute angle takeoff, moderate disease is noted in the proximal segment, 20 to 30% calcific stenosis.  AV groove has a ostial 70 to 80% followed by 70 to 80% stenosis in the proximal segment.  Large OM 2 with a high-grade 80 to 90% hazy ulcerated stenosis. LAD: Large-caliber vessel, has long stents noted in the proximal and mid LAD placed on 01/06/2021 (2 overlapping 2.75 x 12 and 2.5 x 26 millimeter Onyx frontier DES postdilated in the ostium at 3.5 mm [place] balloon) are widely patent, mid to distal LAD is diffusely diseased.  Moderate amount of calcification is evident.  There is a large D1 and distal to the stent D2 with mild disease. RCA: Dominant,.  Large-caliber vessel.  Gives origin to large PDA and large PL branch.  PL branch has a hazy 80% stenosis in the midsegment.   Intervention data: Difficult procedure, successful PTCA and stenting of the mid circumflex after the origin of AV groove branch, a 3.0 x 15 mm frontier Onyx deployed at 12 atm pressure for 60 seconds.  Stenosis reduced from 80% to 0%.  No edge dissection.      Successful stenting of the PL branch of RCA with a 3.0 x 16 mm Synergy XD DES deployed at 13 atm pressure for 60 seconds, stenosis reduced from 80% stenosis to 0% stenosis with TIMI II to TIMI-3 flow.   Recommendation: Patient will be discharged home once stable from medical standpoint, triple therapy with aspirin for 4 weeks and Plavix 75 mg mg p.o. daily for 6 months to a year along with Eliquis in view of ACS.  93 mL contrast utilized.  LABORATORY DATA:    Latest Ref Rng & Units 08/06/2022    1:41 AM 08/05/2022    2:59 AM 10/08/2021    4:11 AM  CBC  WBC 4.0 - 10.5 K/uL  7.3  7.4  14.3   Hemoglobin 12.0 - 15.0 g/dL 9.4  01.0  27.2   Hematocrit 36.0 - 46.0 % 29.6  32.6  32.8   Platelets 150 - 400 K/uL 250  265  425  Latest Ref Rng & Units 08/07/2022    1:06 AM 08/06/2022    1:41 AM 08/05/2022    2:59 AM  CMP  Glucose 70 - 99 mg/dL 562  130  865   BUN 8 - 23 mg/dL 16  12  17    Creatinine 0.44 - 1.00 mg/dL 7.84  6.96  2.95   Sodium 135 - 145 mmol/L 137  139  139   Potassium 3.5 - 5.1 mmol/L 4.1  3.5  3.6   Chloride 98 - 111 mmol/L 102  106  103   CO2 22 - 32 mmol/L 25  24  23    Calcium 8.9 - 10.3 mg/dL 9.0  8.2  8.5   Total Protein 6.5 - 8.1 g/dL   7.0   Total Bilirubin 0.3 - 1.2 mg/dL   0.4   Alkaline Phos 38 - 126 U/L   86   AST 15 - 41 U/L   20   ALT 0 - 44 U/L   18     Lipid Panel  Lab Results  Component Value Date   CHOL 119 08/06/2022   HDL 29 (L) 08/06/2022   LDLCALC 57 08/06/2022   LDLDIRECT 61 08/06/2022   TRIG 165 (H) 08/06/2022   CHOLHDL 4.1 08/06/2022     No components found for: "NTPROBNP" No results for input(s): "PROBNP" in the last 8760 hours.  No results for input(s): "TSH" in the last 8760 hours.   BMP Recent Labs    08/05/22 0259 08/06/22 0141 08/07/22 0106  NA 139 139 137  K 3.6 3.5 4.1  CL 103 106 102  CO2 23 24 25   GLUCOSE 158* 196* 203*  BUN 17 12 16   CREATININE 0.94 0.89 1.04*  CALCIUM 8.5* 8.2* 9.0  GFRNONAA >60 >60 57*    HEMOGLOBIN A1C Lab Results  Component Value Date   HGBA1C 10.4 (H) 10/02/2021   MPG 251.78 10/02/2021    IMPRESSION:    ICD-10-CM   1. Atherosclerosis of native coronary artery of native heart without angina pectoris  I25.10 ezetimibe (ZETIA) 10 MG tablet    atorvastatin (LIPITOR) 40 MG tablet    VASCEPA 1 g capsule    2. S/P primary angioplasty with coronary stent  Z95.5 ezetimibe (ZETIA) 10 MG tablet    atorvastatin (LIPITOR) 40 MG tablet    VASCEPA 1 g capsule    3. Chronic heart failure with preserved ejection fraction (HFpEF) (HCC)  I50.32 torsemide (DEMADEX)  10 MG tablet    spironolactone (ALDACTONE) 25 MG tablet    sacubitril-valsartan (ENTRESTO) 24-26 MG    metoprolol succinate (TOPROL-XL) 25 MG 24 hr tablet    4. History of non-ST elevation myocardial infarction (NSTEMI)  I25.2 ezetimibe (ZETIA) 10 MG tablet    atorvastatin (LIPITOR) 40 MG tablet    VASCEPA 1 g capsule    5. Paroxysmal atrial fibrillation (HCC)  I48.0     6. Long term (current) use of anticoagulants  Z79.01     7. Type 2 diabetes mellitus with hyperglycemia, with long-term current use of insulin (HCC)  E11.65    Z79.4     8. Long-term insulin use (HCC)  Z79.4     9. Mixed hyperlipidemia  E78.2     10. Hypertriglyceridemia, essential  E78.1 VASCEPA 1 g capsule        RECOMMENDATIONS: FABIOLA CORTI is a 72 y.o. Caucasian female whose past medical history and cardiac risk factors include: Paroxysmal atrial fibrillation, CAD status post angioplasty/PCI (distal LM/proximal  LAD, and mid LAD Jan 2023) (mid LCx PCI and PLB branch), ischemic cardiomyopathy, chronic systolic and diastolic heart failure, insulin-dependent diabetes mellitus type 2, hypertension, hyperlipidemia, postmenopausal female, obesity due to excess calories.  Atherosclerosis of native coronary artery of native heart without angina pectoris S/P primary angioplasty with coronary stent Denies anginal factors. As undergoing PCI back in January 2023 in the distal left main/proximal LAD and mid LAD. August 2024 PCI to the mid LCx and PLV branch Currently on triple therapy for 1 month. Patient can stop aspirin as of 09/07/2022 Plavix 75 mg mg p.o. daily for 6 months to a year along with Eliquis in view of ACS. Reemphasized importance of improving her modifiable cardiovascular risk factors. Recent hospitalization records including discharge summary reviewed.  Medications refilled  Chronic heart failure with preserved ejection fraction (HFpEF) (HCC) Stage C, NYHA class II January 2023: LVEF 30-35% August  2024: LVEF 50-55% Medications reconciled and refilled. Strict I's and O's and daily weights. Reemphasized the importance of secondary prevention with focus on improving her modifiable cardiovascular risk factors such as glycemic control, lipid management, blood pressure control, weight loss.  Paroxysmal atrial fibrillation (HCC) Rate control: Metoprolol. Rhythm control: N/A. Thromboembolic prophylaxis: Eliquis. Click Here to Calculate/Change CHADS2VASc Score The patient's CHADS2-VASc score is 6, indicating a 9.7% annual risk of stroke.   CHF History: Yes HTN History: Yes Diabetes History: Yes Stroke History: No Vascular Disease History: Yes  Long term (current) use of anticoagulants Does not endorse evidence of bleeding. Risks, benefits, and alternatives discussed at today's office visit.  Mixed hyperlipidemia Currently on atorvastatin and Zetia.   She denies myalgia or other side effects. Most recent lipids dated August 2024, independently reviewed as noted above.  Hypertriglyceridemia, essential Start Vascepa.  Medication profile discussed.    FINAL MEDICATION LIST END OF ENCOUNTER: Meds ordered this encounter  Medications   torsemide (DEMADEX) 10 MG tablet    Sig: Take 1 tablet (10 mg total) by mouth every morning.    Dispense:  90 tablet    Refill:  0   spironolactone (ALDACTONE) 25 MG tablet    Sig: Take 1 tablet (25 mg total) by mouth daily.    Dispense:  90 tablet    Refill:  0   sacubitril-valsartan (ENTRESTO) 24-26 MG    Sig: Take 1 tablet by mouth 2 (two) times daily.    Dispense:  180 tablet    Refill:  1   metoprolol succinate (TOPROL-XL) 25 MG 24 hr tablet    Sig: Take 1 tablet (25 mg total) by mouth daily.    Dispense:  90 tablet    Refill:  1   ezetimibe (ZETIA) 10 MG tablet    Sig: Take 1 tablet (10 mg total) by mouth daily.    Dispense:  90 tablet    Refill:  0   atorvastatin (LIPITOR) 40 MG tablet    Sig: Take 1 tablet (40 mg total) by mouth at  bedtime.    Dispense:  90 tablet    Refill:  1   VASCEPA 1 g capsule    Sig: Take 2 capsules (2 g total) by mouth 2 (two) times daily.    Dispense:  360 capsule    Refill:  1    Medications Discontinued During This Encounter  Medication Reason   bisoprolol (ZEBETA) 10 MG tablet    atorvastatin (LIPITOR) 40 MG tablet Reorder   ezetimibe (ZETIA) 10 MG tablet Reorder   metoprolol succinate (TOPROL-XL) 25 MG 24  hr tablet Reorder   torsemide (DEMADEX) 10 MG tablet Reorder   sacubitril-valsartan (ENTRESTO) 24-26 MG Reorder   spironolactone (ALDACTONE) 25 MG tablet Reorder      Current Outpatient Medications:    apixaban (ELIQUIS) 5 MG TABS tablet, Take 1 tablet (5 mg total) by mouth 2 (two) times daily., Disp: 60 tablet, Rfl: 1   aspirin EC 81 MG tablet, Take 1 tablet (81 mg total) by mouth daily for 28 days. Swallow whole., Disp: 120 tablet, Rfl: 0   buPROPion (WELLBUTRIN XL) 150 MG 24 hr tablet, Take 150 mg by mouth daily., Disp: , Rfl:    clopidogrel (PLAVIX) 75 MG tablet, Take 1 tablet (75 mg total) by mouth daily for 6 months., Disp: 90 tablet, Rfl: 1   glipiZIDE (GLUCOTROL) 10 MG tablet, Take 10 mg by mouth 2 (two) times daily before a meal., Disp: , Rfl:    glucose blood (ONETOUCH ULTRA) test strip, USE TO TEST 3 TIMES DAILY, Disp: , Rfl:    hydrOXYzine (ATARAX) 10 MG tablet, Take 10-20 mg by mouth every 8 (eight) hours as needed for anxiety or itching., Disp: , Rfl:    insulin aspart (NOVOLOG) 100 UNIT/ML injection, Inject 0-15 Units into the skin 3 (three) times daily with meals., Disp: 10 mL, Rfl: 11   insulin aspart (NOVOLOG) 100 UNIT/ML injection, Inject 0-5 Units into the skin at bedtime., Disp: 10 mL, Rfl: 11   insulin glargine-yfgn (SEMGLEE) 100 UNIT/ML injection, Inject 0.35 mLs (35 Units total) into the skin 2 (two) times daily. (Patient taking differently: Inject 30-35 Units into the skin See admin instructions. Inject 35 units into the skin in the morning and 30 units at  bedtime), Disp: 10 mL, Rfl: 11   levothyroxine (SYNTHROID) 88 MCG tablet, Take 88 mcg by mouth daily before breakfast., Disp: , Rfl:    Multiple Vitamin (MULTIVITAMIN) capsule, Take 1 capsule by mouth daily., Disp: , Rfl:    oxybutynin (DITROPAN XL) 15 MG 24 hr tablet, Take 15 mg by mouth at bedtime., Disp: , Rfl:    oxyCODONE (OXY IR/ROXICODONE) 5 MG immediate release tablet, Take 5-10 mg by mouth every 6 (six) hours as needed for severe pain., Disp: , Rfl:    OZEMPIC, 2 MG/DOSE, 8 MG/3ML SOPN, Inject 2 mg into the skin once a week., Disp: , Rfl:    prednisoLONE acetate (PRED FORTE) 1 % ophthalmic suspension, Place 3 drops into the right eye daily., Disp: , Rfl:    tamsulosin (FLOMAX) 0.4 MG CAPS capsule, Take 0.4 mg by mouth daily., Disp: , Rfl:    VASCEPA 1 g capsule, Take 2 capsules (2 g total) by mouth 2 (two) times daily., Disp: 360 capsule, Rfl: 1   venlafaxine (EFFEXOR) 75 MG tablet, Take 225 mg by mouth daily., Disp: , Rfl:    venlafaxine XR (EFFEXOR-XR) 150 MG 24 hr capsule, Take 150 mg by mouth daily with breakfast., Disp: , Rfl:    atorvastatin (LIPITOR) 40 MG tablet, Take 1 tablet (40 mg total) by mouth at bedtime., Disp: 90 tablet, Rfl: 1   ezetimibe (ZETIA) 10 MG tablet, Take 1 tablet (10 mg total) by mouth daily., Disp: 90 tablet, Rfl: 0   gabapentin (NEURONTIN) 300 MG capsule, Take 1 capsule (300 mg total) by mouth daily after supper., Disp: 90 capsule, Rfl: 0   metFORMIN (GLUCOPHAGE) 1000 MG tablet, Take 1,000 mg by mouth 2 (two) times daily with a meal. (Patient not taking: Reported on 09/03/2022), Disp: , Rfl:  metFORMIN (GLUCOPHAGE-XR) 500 MG 24 hr tablet, Take 1,000 mg by mouth 2 (two) times daily. (Patient not taking: Reported on 09/03/2022), Disp: , Rfl:    metoprolol succinate (TOPROL-XL) 25 MG 24 hr tablet, Take 1 tablet (25 mg total) by mouth daily., Disp: 90 tablet, Rfl: 1   sacubitril-valsartan (ENTRESTO) 24-26 MG, Take 1 tablet by mouth 2 (two) times daily., Disp: 180  tablet, Rfl: 1   spironolactone (ALDACTONE) 25 MG tablet, Take 1 tablet (25 mg total) by mouth daily., Disp: 90 tablet, Rfl: 0   torsemide (DEMADEX) 10 MG tablet, Take 1 tablet (10 mg total) by mouth every morning., Disp: 90 tablet, Rfl: 0  No orders of the defined types were placed in this encounter.   There are no Patient Instructions on file for this visit.   --Continue cardiac medications as reconciled in final medication list. --Return in about 3 months (around 12/04/2022) for Follow up, CAD. Or sooner if needed. --Continue follow-up with your primary care physician regarding the management of your other chronic comorbid conditions.  Patient's questions and concerns were addressed to her satisfaction. She voices understanding of the instructions provided during this encounter.   This note was created using a voice recognition software as a result there may be grammatical errors inadvertently enclosed that do not reflect the nature of this encounter. Every attempt is made to correct such errors.  Tessa Lerner, [place], [facility]  Pager: [phone] Office: [phone]

## 2022-09-16 DIAGNOSIS — N201 Calculus of ureter: Secondary | ICD-10-CM | POA: Diagnosis not present

## 2022-09-20 DIAGNOSIS — E1169 Type 2 diabetes mellitus with other specified complication: Secondary | ICD-10-CM | POA: Diagnosis not present

## 2022-09-20 DIAGNOSIS — E113219 Type 2 diabetes mellitus with mild nonproliferative diabetic retinopathy with macular edema, unspecified eye: Secondary | ICD-10-CM | POA: Diagnosis not present

## 2022-09-20 DIAGNOSIS — Z91148 Patient's other noncompliance with medication regimen for other reason: Secondary | ICD-10-CM | POA: Diagnosis not present

## 2022-09-20 DIAGNOSIS — E1121 Type 2 diabetes mellitus with diabetic nephropathy: Secondary | ICD-10-CM | POA: Diagnosis not present

## 2022-09-20 DIAGNOSIS — Z1231 Encounter for screening mammogram for malignant neoplasm of breast: Secondary | ICD-10-CM | POA: Diagnosis not present

## 2022-09-20 DIAGNOSIS — E039 Hypothyroidism, unspecified: Secondary | ICD-10-CM | POA: Diagnosis not present

## 2022-09-20 DIAGNOSIS — I10 Essential (primary) hypertension: Secondary | ICD-10-CM | POA: Diagnosis not present

## 2022-09-20 DIAGNOSIS — I2510 Atherosclerotic heart disease of native coronary artery without angina pectoris: Secondary | ICD-10-CM | POA: Diagnosis not present

## 2022-09-20 DIAGNOSIS — Z87440 Personal history of urinary (tract) infections: Secondary | ICD-10-CM | POA: Diagnosis not present

## 2022-09-20 DIAGNOSIS — E1140 Type 2 diabetes mellitus with diabetic neuropathy, unspecified: Secondary | ICD-10-CM | POA: Diagnosis not present

## 2022-09-22 ENCOUNTER — Other Ambulatory Visit: Payer: Self-pay | Admitting: Cardiology

## 2022-09-22 ENCOUNTER — Encounter: Payer: Self-pay | Admitting: Cardiology

## 2022-09-22 DIAGNOSIS — I5032 Chronic diastolic (congestive) heart failure: Secondary | ICD-10-CM

## 2022-09-23 NOTE — Progress Notes (Signed)
External Labs: Collected: September 2024 provided by PCP. Total cholesterol 177, triglycerides 228, HDL 41, LDL calculated 90, non-HDL 136 Hemoglobin 12.1, hematocrit 36.1%. Sodium 141, potassium 4.1, chloride 101, bicarb 26. BUN 26, creatinine 0.85. AST ALT and alkaline phosphatase within normal limits A1c 8.8  At the last office visit she was given Vascepa not sure if she started the medication.  I suspect she has its because triglyceride levels have gone up.  Please confirm.  Era Parr [place], DO, [facility]

## 2022-09-23 NOTE — Progress Notes (Signed)
Called pt no answer left vm

## 2022-09-24 ENCOUNTER — Encounter: Payer: Self-pay | Admitting: Cardiology

## 2022-10-01 ENCOUNTER — Other Ambulatory Visit: Payer: Self-pay | Admitting: Cardiology

## 2022-10-01 DIAGNOSIS — E781 Pure hyperglyceridemia: Secondary | ICD-10-CM

## 2022-10-01 DIAGNOSIS — I2510 Atherosclerotic heart disease of native coronary artery without angina pectoris: Secondary | ICD-10-CM

## 2022-10-01 DIAGNOSIS — I252 Old myocardial infarction: Secondary | ICD-10-CM

## 2022-10-01 DIAGNOSIS — Z955 Presence of coronary angioplasty implant and graft: Secondary | ICD-10-CM

## 2022-10-01 MED ORDER — VASCEPA 1 G PO CAPS: 2.0000 g | ORAL_CAPSULE | Freq: Two times a day (BID) | ORAL | 1 refills | Status: DC

## 2022-10-04 ENCOUNTER — Other Ambulatory Visit: Payer: Self-pay | Admitting: Cardiology

## 2022-10-04 ENCOUNTER — Other Ambulatory Visit (HOSPITAL_COMMUNITY): Payer: Self-pay

## 2022-10-04 DIAGNOSIS — Z955 Presence of coronary angioplasty implant and graft: Secondary | ICD-10-CM

## 2022-10-04 DIAGNOSIS — Z79899 Other long term (current) drug therapy: Secondary | ICD-10-CM | POA: Diagnosis not present

## 2022-10-04 DIAGNOSIS — I252 Old myocardial infarction: Secondary | ICD-10-CM

## 2022-10-04 DIAGNOSIS — I2510 Atherosclerotic heart disease of native coronary artery without angina pectoris: Secondary | ICD-10-CM

## 2022-10-04 DIAGNOSIS — E038 Other specified hypothyroidism: Secondary | ICD-10-CM | POA: Diagnosis not present

## 2022-10-04 DIAGNOSIS — E781 Pure hyperglyceridemia: Secondary | ICD-10-CM

## 2022-10-04 MED ORDER — VASCEPA 1 G PO CAPS
2.0000 g | ORAL_CAPSULE | Freq: Two times a day (BID) | ORAL | 1 refills | Status: DC
  Filled 2022-10-04: qty 360, 90d supply, fill #0

## 2022-10-10 ENCOUNTER — Other Ambulatory Visit: Payer: Self-pay | Admitting: Cardiology

## 2022-10-10 DIAGNOSIS — I2510 Atherosclerotic heart disease of native coronary artery without angina pectoris: Secondary | ICD-10-CM

## 2022-10-10 DIAGNOSIS — Z955 Presence of coronary angioplasty implant and graft: Secondary | ICD-10-CM

## 2022-10-10 DIAGNOSIS — I252 Old myocardial infarction: Secondary | ICD-10-CM

## 2022-10-25 DIAGNOSIS — I2510 Atherosclerotic heart disease of native coronary artery without angina pectoris: Secondary | ICD-10-CM | POA: Diagnosis not present

## 2022-10-25 DIAGNOSIS — E1121 Type 2 diabetes mellitus with diabetic nephropathy: Secondary | ICD-10-CM | POA: Diagnosis not present

## 2022-10-25 DIAGNOSIS — Z1231 Encounter for screening mammogram for malignant neoplasm of breast: Secondary | ICD-10-CM | POA: Diagnosis not present

## 2022-10-25 DIAGNOSIS — Z91148 Patient's other noncompliance with medication regimen for other reason: Secondary | ICD-10-CM | POA: Diagnosis not present

## 2022-10-25 DIAGNOSIS — Z23 Encounter for immunization: Secondary | ICD-10-CM | POA: Diagnosis not present

## 2022-10-25 DIAGNOSIS — S8011XA Contusion of right lower leg, initial encounter: Secondary | ICD-10-CM | POA: Diagnosis not present

## 2022-10-25 DIAGNOSIS — E1169 Type 2 diabetes mellitus with other specified complication: Secondary | ICD-10-CM | POA: Diagnosis not present

## 2022-10-25 DIAGNOSIS — E1140 Type 2 diabetes mellitus with diabetic neuropathy, unspecified: Secondary | ICD-10-CM | POA: Diagnosis not present

## 2022-10-25 DIAGNOSIS — I10 Essential (primary) hypertension: Secondary | ICD-10-CM | POA: Diagnosis not present

## 2022-10-25 DIAGNOSIS — E113219 Type 2 diabetes mellitus with mild nonproliferative diabetic retinopathy with macular edema, unspecified eye: Secondary | ICD-10-CM | POA: Diagnosis not present

## 2022-10-30 ENCOUNTER — Other Ambulatory Visit (HOSPITAL_COMMUNITY): Payer: Self-pay

## 2022-11-04 DIAGNOSIS — E1169 Type 2 diabetes mellitus with other specified complication: Secondary | ICD-10-CM | POA: Diagnosis not present

## 2022-11-04 DIAGNOSIS — Z79899 Other long term (current) drug therapy: Secondary | ICD-10-CM | POA: Diagnosis not present

## 2022-11-19 ENCOUNTER — Ambulatory Visit: Payer: Medicare Other

## 2022-11-19 DIAGNOSIS — Z955 Presence of coronary angioplasty implant and graft: Secondary | ICD-10-CM

## 2022-11-19 DIAGNOSIS — E781 Pure hyperglyceridemia: Secondary | ICD-10-CM

## 2022-11-19 DIAGNOSIS — I252 Old myocardial infarction: Secondary | ICD-10-CM

## 2022-11-19 DIAGNOSIS — I2510 Atherosclerotic heart disease of native coronary artery without angina pectoris: Secondary | ICD-10-CM

## 2022-11-23 ENCOUNTER — Telehealth: Payer: Self-pay | Admitting: Cardiology

## 2022-11-23 ENCOUNTER — Ambulatory Visit: Payer: Medicare Other | Attending: Cardiology

## 2022-11-23 DIAGNOSIS — I2510 Atherosclerotic heart disease of native coronary artery without angina pectoris: Secondary | ICD-10-CM | POA: Diagnosis not present

## 2022-11-23 DIAGNOSIS — E781 Pure hyperglyceridemia: Secondary | ICD-10-CM | POA: Diagnosis not present

## 2022-11-23 DIAGNOSIS — I252 Old myocardial infarction: Secondary | ICD-10-CM | POA: Diagnosis not present

## 2022-11-23 DIAGNOSIS — Z955 Presence of coronary angioplasty implant and graft: Secondary | ICD-10-CM | POA: Diagnosis not present

## 2022-11-23 NOTE — Telephone Encounter (Signed)
Noted.  Patient completed lab today.  Will send once lab resulted. Postponed for 11/21

## 2022-11-23 NOTE — Telephone Encounter (Signed)
Patient would like to have her lab work results (from her lab appointment today) sent over to Her Urologist:  [facility] Dr. Reeves Forth [address]  [place], [place] [address] Fax: [phone]  Patient has filled out a release of protected health information form to release these lab results to Dr. Reeves Forth - and I have scanned this release into her documents for your reference.  Thank you - Pearletha Forge

## 2022-11-24 LAB — HEPATIC FUNCTION PANEL
ALT: 22 [IU]/L (ref 0–32)
AST: 17 [IU]/L (ref 0–40)
Albumin: 4.2 g/dL (ref 3.8–4.8)
Alkaline Phosphatase: 111 [IU]/L (ref 44–121)
Bilirubin Total: 0.3 mg/dL (ref 0.0–1.2)
Bilirubin, Direct: 0.16 mg/dL (ref 0.00–0.40)
Total Protein: 7.3 g/dL (ref 6.0–8.5)

## 2022-11-24 LAB — LDL CHOLESTEROL, DIRECT: LDL Direct: 61 mg/dL (ref 0–99)

## 2022-11-24 LAB — LIPID PANEL
Chol/HDL Ratio: 3.3 ratio (ref 0.0–4.4)
Cholesterol, Total: 130 mg/dL (ref 100–199)
HDL: 40 mg/dL (ref >39)
LDL Chol Calc (NIH): 62 mg/dL (ref 0–99)
Triglycerides: 165 mg/dL — ABNORMAL HIGH (ref 0–149)
VLDL Cholesterol Cal: 28 mg/dL (ref 5–40)

## 2022-11-24 NOTE — Telephone Encounter (Signed)
Labs sent to [facility] as requested.

## 2022-11-24 NOTE — Telephone Encounter (Signed)
Please send them a copy as requested.   Zuria Fosdick [place], DO, [facility]

## 2022-11-25 DIAGNOSIS — E1169 Type 2 diabetes mellitus with other specified complication: Secondary | ICD-10-CM | POA: Diagnosis not present

## 2022-11-29 ENCOUNTER — Telehealth: Payer: Self-pay

## 2022-11-29 NOTE — Telephone Encounter (Signed)
-----   Message from Nurse [facility] R sent at 11/25/2022  5:09 PM EST -----  ----- Message ----- From: Tessa Lerner, DO Sent: 11/24/2022  12:55 PM EST To: Cv Div [facility] St Triage  Triglyceride levels have improved-in September they were 228 mg/dL and 956 mg/dL.  Recommend a goal triglyceride levels <149 mg/dL  Continue atorvastatin 40 mg p.o. nightly as well as Zetia 10 mg p.o. daily.  Recommend focusing on lifestyle changes -reduce foods that are high in triglycerides and cholesterol.  We will review the results at the upcoming office visit.  Regards,   Sunit [place], DO, [facility]

## 2022-12-04 DIAGNOSIS — E1169 Type 2 diabetes mellitus with other specified complication: Secondary | ICD-10-CM | POA: Diagnosis not present

## 2022-12-04 DIAGNOSIS — I10 Essential (primary) hypertension: Secondary | ICD-10-CM | POA: Diagnosis not present

## 2022-12-04 DIAGNOSIS — Z79899 Other long term (current) drug therapy: Secondary | ICD-10-CM | POA: Diagnosis not present

## 2022-12-06 ENCOUNTER — Ambulatory Visit: Payer: Self-pay | Admitting: Cardiology

## 2022-12-30 DIAGNOSIS — Z1231 Encounter for screening mammogram for malignant neoplasm of breast: Secondary | ICD-10-CM | POA: Diagnosis not present

## 2023-01-01 ENCOUNTER — Other Ambulatory Visit: Payer: Self-pay | Admitting: Cardiology

## 2023-01-04 ENCOUNTER — Ambulatory Visit: Payer: Medicare Other | Attending: Cardiology | Admitting: Cardiology

## 2023-01-04 DIAGNOSIS — Z79899 Other long term (current) drug therapy: Secondary | ICD-10-CM | POA: Diagnosis not present

## 2023-01-06 ENCOUNTER — Other Ambulatory Visit (HOSPITAL_COMMUNITY): Payer: Self-pay

## 2023-01-12 ENCOUNTER — Other Ambulatory Visit: Payer: Self-pay | Admitting: Cardiology

## 2023-01-12 DIAGNOSIS — I5032 Chronic diastolic (congestive) heart failure: Secondary | ICD-10-CM

## 2023-01-20 DIAGNOSIS — Z7901 Long term (current) use of anticoagulants: Secondary | ICD-10-CM | POA: Diagnosis not present

## 2023-01-20 DIAGNOSIS — E039 Hypothyroidism, unspecified: Secondary | ICD-10-CM | POA: Diagnosis not present

## 2023-01-20 DIAGNOSIS — N201 Calculus of ureter: Secondary | ICD-10-CM | POA: Diagnosis not present

## 2023-01-20 DIAGNOSIS — I5032 Chronic diastolic (congestive) heart failure: Secondary | ICD-10-CM | POA: Diagnosis not present

## 2023-01-20 DIAGNOSIS — I2510 Atherosclerotic heart disease of native coronary artery without angina pectoris: Secondary | ICD-10-CM | POA: Diagnosis not present

## 2023-01-20 DIAGNOSIS — I110 Hypertensive heart disease with heart failure: Secondary | ICD-10-CM | POA: Diagnosis not present

## 2023-01-20 DIAGNOSIS — Z955 Presence of coronary angioplasty implant and graft: Secondary | ICD-10-CM | POA: Diagnosis not present

## 2023-01-20 DIAGNOSIS — Z79899 Other long term (current) drug therapy: Secondary | ICD-10-CM | POA: Diagnosis not present

## 2023-01-20 DIAGNOSIS — Z7982 Long term (current) use of aspirin: Secondary | ICD-10-CM | POA: Diagnosis not present

## 2023-01-20 DIAGNOSIS — Z7902 Long term (current) use of antithrombotics/antiplatelets: Secondary | ICD-10-CM | POA: Diagnosis not present

## 2023-01-20 DIAGNOSIS — Z466 Encounter for fitting and adjustment of urinary device: Secondary | ICD-10-CM | POA: Diagnosis not present

## 2023-01-20 DIAGNOSIS — Z7985 Long-term (current) use of injectable non-insulin antidiabetic drugs: Secondary | ICD-10-CM | POA: Diagnosis not present

## 2023-01-20 DIAGNOSIS — Z7984 Long term (current) use of oral hypoglycemic drugs: Secondary | ICD-10-CM | POA: Diagnosis not present

## 2023-01-20 DIAGNOSIS — Z8616 Personal history of COVID-19: Secondary | ICD-10-CM | POA: Diagnosis not present

## 2023-01-20 DIAGNOSIS — I499 Cardiac arrhythmia, unspecified: Secondary | ICD-10-CM | POA: Diagnosis not present

## 2023-01-20 DIAGNOSIS — I480 Paroxysmal atrial fibrillation: Secondary | ICD-10-CM | POA: Diagnosis not present

## 2023-01-20 DIAGNOSIS — E119 Type 2 diabetes mellitus without complications: Secondary | ICD-10-CM | POA: Diagnosis not present

## 2023-01-25 DIAGNOSIS — Z1231 Encounter for screening mammogram for malignant neoplasm of breast: Secondary | ICD-10-CM | POA: Diagnosis not present

## 2023-01-31 ENCOUNTER — Ambulatory Visit: Payer: Medicare Other | Attending: Cardiology | Admitting: Cardiology

## 2023-01-31 ENCOUNTER — Encounter: Payer: Self-pay | Admitting: Cardiology

## 2023-01-31 VITALS — BP 132/62 | HR 64 | Ht 63.0 in | Wt 201.2 lb

## 2023-01-31 DIAGNOSIS — E781 Pure hyperglyceridemia: Secondary | ICD-10-CM | POA: Diagnosis not present

## 2023-01-31 DIAGNOSIS — Z955 Presence of coronary angioplasty implant and graft: Secondary | ICD-10-CM

## 2023-01-31 DIAGNOSIS — E1165 Type 2 diabetes mellitus with hyperglycemia: Secondary | ICD-10-CM

## 2023-01-31 DIAGNOSIS — E782 Mixed hyperlipidemia: Secondary | ICD-10-CM

## 2023-01-31 DIAGNOSIS — I480 Paroxysmal atrial fibrillation: Secondary | ICD-10-CM | POA: Diagnosis not present

## 2023-01-31 DIAGNOSIS — I5032 Chronic diastolic (congestive) heart failure: Secondary | ICD-10-CM | POA: Diagnosis not present

## 2023-01-31 DIAGNOSIS — I252 Old myocardial infarction: Secondary | ICD-10-CM

## 2023-01-31 DIAGNOSIS — Z7901 Long term (current) use of anticoagulants: Secondary | ICD-10-CM | POA: Diagnosis not present

## 2023-01-31 DIAGNOSIS — Z794 Long term (current) use of insulin: Secondary | ICD-10-CM | POA: Diagnosis not present

## 2023-01-31 DIAGNOSIS — I2510 Atherosclerotic heart disease of native coronary artery without angina pectoris: Secondary | ICD-10-CM

## 2023-01-31 DIAGNOSIS — Z6835 Body mass index (BMI) 35.0-35.9, adult: Secondary | ICD-10-CM

## 2023-01-31 DIAGNOSIS — E66812 Obesity, class 2: Secondary | ICD-10-CM

## 2023-01-31 NOTE — Progress Notes (Signed)
Cardiology Office Note:  .   Date:  01/31/2023  ID:  Mallory Butler, DOB [birth year], MRN [id number] PCP:  Alinda Deem, MD  Former Cardiology Providers: N/A [facility] Providers Cardiologist:  Tessa Lerner, DO , [facility] (established care January 2023.) Electrophysiologist:  None  Click to update primary MD,subspecialty MD or APP then REFRESH:1}    Chief Complaint  Patient presents with  . Follow-up    CAD, status post coronary interventions    History of Present Illness: .   Mallory Butler is a 73 y.o. Caucasian female whose past medical history and cardiovascular risk factors includes: CAD status post angioplasty/PCI (distal LM/proximal LAD, and mid LAD), ischemic cardiomyopathy, chronic systolic and diastolic heart failure, insulin-dependent diabetes mellitus type 2, hypertension, hyperlipidemia, postmenopausal female, obesity due to excess calories.   Establish care in January 2023 when she was admitted to the hospital for shortness of breath and diagnosed with COVID-19 pneumonia. Echocardiogram noted reduced LVEF and regional wall motion abnormalities and given the elevated cardiac biomarkers she underwent angiography and was noted to have obstructive CAD in the distal left main/LAD distribution. She underwent angioplasty/PCI to the distal left main/proximal LAD and mid LAD.   She had another hospitalization in August 2024 for DKA during that hospitalization she was transferred from [facility] to [facility] for cardiovascular workup.  Echocardiogram noted mildly reduced LVEF with regional wall motion normalities.  She underwent angiography and was noted to have obstructive disease underwent coronary intervention.  Patient is accompanied by her daughter at today's office visit.  Since last office visit she denies anginal chest pain or heart failure symptoms.  Patient has slowly improved her physical activity to the point that she is walking 10,000 steps per day.  She is no  longer using a wheelchair and only requires a cane for longer distance.   Review of Systems: .   Review of Systems  Cardiovascular:  Positive for dyspnea on exertion (chronic, improved). Negative for chest pain, claudication, irregular heartbeat, leg swelling, near-syncope, orthopnea, palpitations, paroxysmal nocturnal dyspnea and syncope.  Respiratory:  Negative for shortness of breath.   Hematologic/Lymphatic: Negative for bleeding problem.  Musculoskeletal:  Negative for muscle cramps and myalgias.  Neurological:  Negative for dizziness and light-headedness.    Studies Reviewed:   EKG: EKG Interpretation Date/Time:  Monday January 31 2023 16:20:12 EST Ventricular Rate:  71 PR Interval:  164 QRS Duration:  88 QT Interval:  398 QTC Calculation: 432 R Axis:   -66  Text Interpretation: Normal sinus rhythm Left axis deviation Consider Septal infarct (cited on or before 02-Feb-2021) When compared with ECG of 06-Aug-2022 07:00, Nonspecific T wave abnormality no longer evident in Inferior leads T wave inversion less evident in Anterolateral leads Confirmed by Tessa Lerner [phone]) on 01/31/2023 4:24:41 PM  Echocardiogram: 02/01/2021: LVEF 30-35%, grade 1 diastolic dysfunction, regional wall motion abnormalities, estimated RAP 15 mmHg, see report for additional details   10/03/2021: 60-65%, RWMA, diastolic parameters are indeterminate, see report for more details.   Recent echo at [facility] 2024 - no report sent w/ the records. But documentation noted. LVEF of 50-55% with distal inferior/posterior, inferoseptal, anteroseptal region hypokinetic at the apical levels per documentation  Coronary angiogram 08/06/2022: LV hemodynamics: LV 162/6, EDP 16 mmHg.  Ao 165/78, mean 180 mmHg.  No pressure gradient across the aortic valve. LM: Large-caliber vessel, mildly calcified. LCx: Large-caliber vessel, has acute angle takeoff, moderate disease is noted in the proximal segment, 20 to 30%  calcific  stenosis.  AV groove has a ostial 70 to 80% followed by 70 to 80% stenosis in the proximal segment.  Large OM 2 with a high-grade 80 to 90% hazy ulcerated stenosis. LAD: Large-caliber vessel, has long stents noted in the proximal and mid LAD placed on 01/06/2021 (2 overlapping 2.75 x 12 and 2.5 x 26 millimeter Onyx frontier DES postdilated in the ostium at 3.5 mm Oldham balloon) are widely patent, mid to distal LAD is diffusely diseased.  Moderate amount of calcification is evident.  There is a large D1 and distal to the stent D2 with mild disease. RCA: Dominant,.  Large-caliber vessel.  Gives origin to large PDA and large PL branch.  PL branch has a hazy 80% stenosis in the midsegment.   Intervention data: Difficult procedure, successful PTCA and stenting of the mid circumflex after the origin of AV groove branch, a 3.0 x 15 mm frontier Onyx deployed at 12 atm pressure for 60 seconds.  Stenosis reduced from 80% to 0%.  No edge dissection.      Successful stenting of the PL branch of RCA with a 3.0 x 16 mm Synergy XD DES deployed at 13 atm pressure for 60 seconds, stenosis reduced from 80% stenosis to 0% stenosis with TIMI II to TIMI-3 flow.   Recommendation: Patient will be discharged home once stable from medical standpoint, triple therapy with aspirin for 4 weeks and Plavix 75 mg mg p.o. daily for 6 months to a year along with Eliquis in view of ACS.  93 mL contrast utilized.  RADIOLOGY: NA  Risk Assessment/Calculations:   Click Here to Calculate/Change CHADS2VASc Score The patient's CHADS2-VASc score is 6, indicating a 9.7% annual risk of stroke.     CHF History: Yes HTN History: Yes Diabetes History: Yes Stroke History: No Vascular Disease History: Yes  Labs:       Latest Ref Rng & Units 08/06/2022    1:41 AM 08/05/2022    2:59 AM 10/08/2021    4:11 AM  CBC  WBC 4.0 - 10.5 K/uL 7.3  7.4  14.3   Hemoglobin 12.0 - 15.0 g/dL 9.4  29.5  62.1   Hematocrit 36.0 - 46.0 % 29.6  32.6   32.8   Platelets 150 - 400 K/uL 250  265  425        Latest Ref Rng & Units 08/07/2022    1:06 AM 08/06/2022    1:41 AM 08/05/2022    2:59 AM  BMP  Glucose 70 - 99 mg/dL 308  657  846   BUN 8 - 23 mg/dL 16  12  17    Creatinine 0.44 - 1.00 mg/dL 9.62  9.52  8.41   Sodium 135 - 145 mmol/L 137  139  139   Potassium 3.5 - 5.1 mmol/L 4.1  3.5  3.6   Chloride 98 - 111 mmol/L 102  106  103   CO2 22 - 32 mmol/L 25  24  23    Calcium 8.9 - 10.3 mg/dL 9.0  8.2  8.5       Latest Ref Rng & Units 11/23/2022   11:30 AM 08/07/2022    1:06 AM 08/06/2022    1:41 AM  CMP  Glucose 70 - 99 mg/dL  324  401   BUN 8 - 23 mg/dL  16  12   Creatinine 0.27 - 1.00 mg/dL  2.53  6.64   Sodium 403 - 145 mmol/L  137  139   Potassium 3.5 - 5.1 mmol/L  4.1  3.5   Chloride 98 - 111 mmol/L  102  106   CO2 22 - 32 mmol/L  25  24   Calcium 8.9 - 10.3 mg/dL  9.0  8.2   Total Protein 6.0 - 8.5 g/dL 7.3     Total Bilirubin 0.0 - 1.2 mg/dL 0.3     Alkaline Phos 44 - 121 IU/L 111     AST 0 - 40 IU/L 17     ALT 0 - 32 IU/L 22       Lab Results  Component Value Date   CHOL 130 11/23/2022   HDL 40 11/23/2022   LDLCALC 62 11/23/2022   LDLDIRECT 61 11/23/2022   TRIG 165 (H) 11/23/2022   CHOLHDL 3.3 11/23/2022   Recent Labs    08/06/22 0141  LIPOA 35.9*   No components found for: "NTPROBNP" No results for input(s): "PROBNP" in the last 8760 hours. No results for input(s): "TSH" in the last 8760 hours.    Physical Exam:    Today's Vitals   01/31/23 1623  BP: 132/62  Pulse: 64  SpO2: 97%  Weight: 201 lb 3.2 oz (91.3 kg)  Height: 5\' 3"  (1.6 m)   Body mass index is 35.64 kg/m. Wt Readings from Last 3 Encounters:  01/31/23 201 lb 3.2 oz (91.3 kg)  09/03/22 220 lb (99.8 kg)  08/07/22 219 lb 9.3 oz (99.6 kg)    Physical Exam  Constitutional: She appears chronically ill.  Walks with a cane, hemodynamically stable.   Neck: No JVD present.  Cardiovascular: Normal rate, regular rhythm, S1 normal, S2  normal, intact distal pulses and normal pulses. Exam reveals no gallop, no S3 and no S4.  No murmur heard. Pulmonary/Chest: Effort normal and breath sounds normal. No stridor. She has no wheezes. She has no rales.  Abdominal: Soft. Bowel sounds are normal. She exhibits no distension. There is no abdominal tenderness.  Musculoskeletal:        General: Edema (trace b/l) present.     Cervical back: Neck supple.  Neurological: She is alert and oriented to person, place, and time. She has intact cranial nerves (2-12).  Skin: Skin is warm and dry.     Impression & Recommendation(s):  Impression:   ICD-10-CM   1. Coronary artery disease involving native coronary artery of native heart without angina pectoris  I25.10 EKG 12-Lead    2. S/P primary angioplasty with coronary stent  Z95.5     3. History of non-ST elevation myocardial infarction (NSTEMI)  I25.2     4. Chronic heart failure with preserved ejection fraction (HFpEF) (HCC)  I50.32     5. Paroxysmal atrial fibrillation (HCC)  I48.0     6. Long term (current) use of anticoagulants  Z79.01     7. Type 2 diabetes mellitus with hyperglycemia, with long-term current use of insulin (HCC)  E11.65    Z79.4     8. Mixed hyperlipidemia  E78.2     9. Hypertriglyceridemia, essential  E78.1     10. Class 2 severe obesity due to excess calories with serious comorbidity and body mass index (BMI) of 35.0 to 35.9 in adult [facility])  Z61.096    E66.01    Z68.35        Recommendation(s):  Coronary artery disease involving native coronary artery of native heart without angina pectoris S/P angioplasty with coronary stent History of non-ST elevation myocardial infarction (NSTEMI) Denies anginal chest pain. PCI in January 2023 to the distal left  main/proximal LAD and mid LAD. August 2024 PCI to the mid LCx and PLV branch. Continue Plavix 75 mg p.o. daily, ideally will complete 1 year of treatment as the most recent stent were placed in the setting  of ACS. EKG nonischemic. Outside labs from November 2024 independently reviewed and noted above. LDL is currently 62 mg/dL, triglycerides 132 mg/dL, ideally would like the LDL <55 mg/dL and triglycerides less than 150 mg/dL. Currently on Ozempic and I am hopeful that her lipid panel should improve as she continues to lose weight.  However if not may need to consider addition of PCSK9 inhibitor at next office visit. Reemphasized the importance of secondary prevention with focus on improving her modifiable cardiovascular risk factors such as glycemic control, lipid management, blood pressure control, weight loss.  Chronic heart failure with preserved ejection fraction (HFpEF) (HCC) Stage C, NYHA class II January 2023: LVEF 30-35% August 2024: LVEF 50-55% Continue metoprolol succinate 25 mg p.o. daily. Continue Entresto, she will call us back with regards to the dosage of her medication. Continue spironolactone 25 mg p.o. daily. Continue Demadex 10 mg daily.  Paroxysmal atrial fibrillation (HCC) Rate control: Metoprolol. Rhythm control: N/A. Thromboembolic prophylaxis: Eliquis Does not endorse evidence of bleeding. Risks, benefits, and alternatives to anticoagulation discussed  Type 2 diabetes mellitus with hyperglycemia, with long-term current use of insulin (HCC) Currently on insulin as well as oral antiglycemic agents. Most recent hemoglobin A1c 8.8 as of September 2024 Reemphasized importance of glycemic control.. Currently on Arni, statin therapy, Zetia, Vascepa, Ozempic  Mixed hyperlipidemia Currently on atorvastatin 40 mg p.o. daily, Zetia 10 mg p.o. daily.   She denies myalgia or other side effects. Most recent lipids dated November 23, 2022, independently reviewed as noted above.  LDL is 62.  Cardiology is following peripherally.   Hypertriglyceridemia, essential Continue Vascepa. Recommend a goal triglyceride level <150 mg/dL  Class 2 severe obesity due to excess  calories with serious comorbidity and body mass index (BMI) of 35.0 to 35.9 in adult [facility]) Body mass index is 35.64 kg/m. I reviewed with her importance of diet, regular physical activity/exercise, weight loss.   Patient is educated on the importance of increasing physical activity gradually as tolerated with a goal of moderate intensity exercise for 30 minutes a day 5 days a week.  Orders Placed:  Orders Placed This Encounter  Procedures  . EKG 12-Lead    Final Medication List:   No orders of the defined types were placed in this encounter.   Medications Discontinued During This Encounter  Medication Reason  . atorvastatin (LIPITOR) 40 MG tablet      Current Outpatient Medications:  .  apixaban (ELIQUIS) 5 MG TABS tablet, Take 1 tablet (5 mg total) by mouth 2 (two) times daily., Disp: 60 tablet, Rfl: 1 .  atorvastatin (LIPITOR) 40 MG tablet, Take by mouth., Disp: , Rfl:  .  buPROPion (WELLBUTRIN XL) 150 MG 24 hr tablet, Take 150 mg by mouth daily., Disp: , Rfl:  .  clopidogrel (PLAVIX) 75 MG tablet, Take 1 tablet (75 mg total) by mouth daily for 6 months., Disp: 90 tablet, Rfl: 1 .  ezetimibe (ZETIA) 10 MG tablet, TAKE 1 TABLET BY MOUTH EVERY DAY, Disp: 90 tablet, Rfl: 2 .  glipiZIDE (GLUCOTROL) 10 MG tablet, Take 10 mg by mouth 2 (two) times daily before a meal., Disp: , Rfl:  .  glucose blood (ONETOUCH ULTRA) test strip, USE TO TEST 3 TIMES DAILY, Disp: , Rfl:  .  hydrOXYzine (  ATARAX) 10 MG tablet, Take 10-20 mg by mouth every 8 (eight) hours as needed for anxiety or itching., Disp: , Rfl:  .  insulin aspart (NOVOLOG) 100 UNIT/ML injection, Inject 0-15 Units into the skin 3 (three) times daily with meals., Disp: 10 mL, Rfl: 11 .  insulin aspart (NOVOLOG) 100 UNIT/ML injection, Inject 0-5 Units into the skin at bedtime., Disp: 10 mL, Rfl: 11 .  insulin glargine-yfgn (SEMGLEE) 100 UNIT/ML injection, Inject 0.35 mLs (35 Units total) into the skin 2 (two) times daily. (Patient taking  differently: Inject 30-35 Units into the skin See admin instructions. Inject 35 units into the skin in the morning and 30 units at bedtime), Disp: 10 mL, Rfl: 11 .  levothyroxine (SYNTHROID) 88 MCG tablet, Take 88 mcg by mouth daily before breakfast., Disp: , Rfl:  .  metoprolol succinate (TOPROL-XL) 25 MG 24 hr tablet, Take 1 tablet (25 mg total) by mouth daily., Disp: 90 tablet, Rfl: 1 .  Multiple Vitamin (MULTIVITAMIN) capsule, Take 1 capsule by mouth daily., Disp: , Rfl:  .  oxybutynin (DITROPAN XL) 15 MG 24 hr tablet, Take 15 mg by mouth at bedtime., Disp: , Rfl:  .  oxyCODONE (OXY IR/ROXICODONE) 5 MG immediate release tablet, Take 5-10 mg by mouth every 6 (six) hours as needed for severe pain., Disp: , Rfl:  .  pioglitazone (ACTOS) 45 MG tablet, Take by mouth., Disp: , Rfl:  .  prednisoLONE acetate (PRED FORTE) 1 % ophthalmic suspension, Place 3 drops into the right eye daily., Disp: , Rfl:  .  sacubitril-valsartan (ENTRESTO) 24-26 MG, Take 1 tablet by mouth 2 (two) times daily., Disp: 180 tablet, Rfl: 1 .  sacubitril-valsartan (ENTRESTO) 97-103 MG, TAKE 1 TABLET BY MOUTH TWICE A DAY, Disp: 180 tablet, Rfl: 2 .  spironolactone (ALDACTONE) 25 MG tablet, Take 1 tablet (25 mg total) by mouth daily., Disp: 90 tablet, Rfl: 2 .  tamsulosin (FLOMAX) 0.4 MG CAPS capsule, Take 0.4 mg by mouth daily., Disp: , Rfl:  .  VASCEPA 1 g capsule, Take 2 capsules (2 g total) by mouth 2 (two) times daily., Disp: 360 capsule, Rfl: 1 .  venlafaxine (EFFEXOR) 75 MG tablet, Take 225 mg by mouth daily., Disp: , Rfl:  .  venlafaxine XR (EFFEXOR-XR) 150 MG 24 hr capsule, Take 150 mg by mouth daily with breakfast., Disp: , Rfl:  .  gabapentin (NEURONTIN) 300 MG capsule, Take 1 capsule (300 mg total) by mouth daily after supper., Disp: 90 capsule, Rfl: 0 .  metFORMIN (GLUCOPHAGE) 1000 MG tablet, Take 1,000 mg by mouth 2 (two) times daily with a meal. (Patient not taking: Reported on 01/31/2023), Disp: , Rfl:  .  metFORMIN  (GLUCOPHAGE-XR) 500 MG 24 hr tablet, Take 1,000 mg by mouth 2 (two) times daily. (Patient not taking: Reported on 01/31/2023), Disp: , Rfl:  .  OZEMPIC, 2 MG/DOSE, 8 MG/3ML SOPN, Inject 2 mg into the skin once a week. (Patient not taking: Reported on 01/31/2023), Disp: , Rfl:  .  torsemide (DEMADEX) 10 MG tablet, TAKE 1 TABLET (10 MG TOTAL) BY MOUTH EVERY MORNING., Disp: 90 tablet, Rfl: 0  Consent:   NA  Disposition:   6 months sooner if needed  Her questions and concerns were addressed to her satisfaction. She voices understanding of the recommendations provided during this encounter.    Signed, Tessa Lerner, DO, [facility]  [facility]  [address] [place], [place] [address] 01/31/2023 5:57 PM

## 2023-01-31 NOTE — Patient Instructions (Signed)
Follow-Up: At [facility], you and your health needs are our priority.  As part of our continuing mission to provide you with exceptional heart care, we have created designated Provider Care Teams.  These Care Teams include your primary Cardiologist (physician) and Advanced Practice Providers (APPs -  Physician Assistants and Nurse Practitioners) who all work together to provide you with the care you need, when you need it.  We recommend signing up for the patient portal called "MyChart".  Sign up information is provided on this After Visit Summary.  MyChart is used to connect with patients for Virtual Visits (Telemedicine).  Patients are able to view lab/test results, encounter notes, upcoming appointments, etc.  Non-urgent messages can be sent to your provider as well.   To learn more about what you can do with MyChart, go to [email or web address].    Your next appointment:   6 month(s)  Provider:   Tessa Lerner, DO     Other Instructions   1st Floor: - Lobby - Registration  - Pharmacy  - Lab - Cafe  2nd Floor: - PV Lab - Diagnostic Testing (echo, CT, nuclear med)  3rd Floor: - Vacant  4th Floor: - TCTS (cardiothoracic surgery) - AFib Clinic - Structural Heart Clinic - Vascular Surgery  - Vascular Ultrasound  5th Floor: - HeartCare Cardiology (general and EP) - Clinical Pharmacy for coumadin, hypertension, lipid, weight-loss medications, and med management appointments    Valet parking services will be available as well.

## 2023-02-04 DIAGNOSIS — I10 Essential (primary) hypertension: Secondary | ICD-10-CM | POA: Diagnosis not present

## 2023-02-04 DIAGNOSIS — Z79899 Other long term (current) drug therapy: Secondary | ICD-10-CM | POA: Diagnosis not present

## 2023-02-07 DIAGNOSIS — E1140 Type 2 diabetes mellitus with diabetic neuropathy, unspecified: Secondary | ICD-10-CM | POA: Diagnosis not present

## 2023-02-07 DIAGNOSIS — Z0000 Encounter for general adult medical examination without abnormal findings: Secondary | ICD-10-CM | POA: Diagnosis not present

## 2023-02-07 DIAGNOSIS — E1169 Type 2 diabetes mellitus with other specified complication: Secondary | ICD-10-CM | POA: Diagnosis not present

## 2023-02-07 DIAGNOSIS — E1121 Type 2 diabetes mellitus with diabetic nephropathy: Secondary | ICD-10-CM | POA: Diagnosis not present

## 2023-02-07 DIAGNOSIS — Z1389 Encounter for screening for other disorder: Secondary | ICD-10-CM | POA: Diagnosis not present

## 2023-02-07 DIAGNOSIS — E7800 Pure hypercholesterolemia, unspecified: Secondary | ICD-10-CM | POA: Diagnosis not present

## 2023-02-11 DIAGNOSIS — N201 Calculus of ureter: Secondary | ICD-10-CM | POA: Diagnosis not present

## 2023-03-01 ENCOUNTER — Other Ambulatory Visit (HOSPITAL_COMMUNITY): Payer: Self-pay

## 2023-03-02 ENCOUNTER — Other Ambulatory Visit: Payer: Self-pay | Admitting: Cardiology

## 2023-03-02 DIAGNOSIS — I5032 Chronic diastolic (congestive) heart failure: Secondary | ICD-10-CM

## 2023-03-03 ENCOUNTER — Other Ambulatory Visit: Payer: Self-pay | Admitting: Cardiology

## 2023-03-03 DIAGNOSIS — I5032 Chronic diastolic (congestive) heart failure: Secondary | ICD-10-CM

## 2023-03-04 DIAGNOSIS — E1169 Type 2 diabetes mellitus with other specified complication: Secondary | ICD-10-CM | POA: Diagnosis not present

## 2023-03-04 DIAGNOSIS — E7800 Pure hypercholesterolemia, unspecified: Secondary | ICD-10-CM | POA: Diagnosis not present

## 2023-03-04 DIAGNOSIS — I10 Essential (primary) hypertension: Secondary | ICD-10-CM | POA: Diagnosis not present

## 2023-03-04 DIAGNOSIS — E039 Hypothyroidism, unspecified: Secondary | ICD-10-CM | POA: Diagnosis not present

## 2023-03-04 DIAGNOSIS — Z79899 Other long term (current) drug therapy: Secondary | ICD-10-CM | POA: Diagnosis not present

## 2023-03-22 ENCOUNTER — Encounter: Payer: Self-pay | Admitting: Cardiology

## 2023-03-22 NOTE — Telephone Encounter (Signed)
 error

## 2023-03-24 DIAGNOSIS — N201 Calculus of ureter: Secondary | ICD-10-CM | POA: Diagnosis not present

## 2023-04-04 DIAGNOSIS — E039 Hypothyroidism, unspecified: Secondary | ICD-10-CM | POA: Diagnosis not present

## 2023-04-04 DIAGNOSIS — I10 Essential (primary) hypertension: Secondary | ICD-10-CM | POA: Diagnosis not present

## 2023-04-04 DIAGNOSIS — Z79899 Other long term (current) drug therapy: Secondary | ICD-10-CM | POA: Diagnosis not present

## 2023-04-04 DIAGNOSIS — R300 Dysuria: Secondary | ICD-10-CM | POA: Diagnosis not present

## 2023-04-04 DIAGNOSIS — E1169 Type 2 diabetes mellitus with other specified complication: Secondary | ICD-10-CM | POA: Diagnosis not present

## 2023-04-04 DIAGNOSIS — E7800 Pure hypercholesterolemia, unspecified: Secondary | ICD-10-CM | POA: Diagnosis not present

## 2023-05-04 DIAGNOSIS — Z79899 Other long term (current) drug therapy: Secondary | ICD-10-CM | POA: Diagnosis not present

## 2023-05-04 DIAGNOSIS — E7800 Pure hypercholesterolemia, unspecified: Secondary | ICD-10-CM | POA: Diagnosis not present

## 2023-06-04 DIAGNOSIS — N1832 Chronic kidney disease, stage 3b: Secondary | ICD-10-CM | POA: Diagnosis not present

## 2023-06-04 DIAGNOSIS — Z79899 Other long term (current) drug therapy: Secondary | ICD-10-CM | POA: Diagnosis not present

## 2023-06-04 DIAGNOSIS — I10 Essential (primary) hypertension: Secondary | ICD-10-CM | POA: Diagnosis not present

## 2023-06-04 DIAGNOSIS — E1169 Type 2 diabetes mellitus with other specified complication: Secondary | ICD-10-CM | POA: Diagnosis not present

## 2023-07-27 DIAGNOSIS — I10 Essential (primary) hypertension: Secondary | ICD-10-CM | POA: Diagnosis not present

## 2023-07-27 DIAGNOSIS — E1169 Type 2 diabetes mellitus with other specified complication: Secondary | ICD-10-CM | POA: Diagnosis not present

## 2023-07-27 DIAGNOSIS — E7800 Pure hypercholesterolemia, unspecified: Secondary | ICD-10-CM | POA: Diagnosis not present

## 2023-07-27 DIAGNOSIS — E039 Hypothyroidism, unspecified: Secondary | ICD-10-CM | POA: Diagnosis not present

## 2023-08-04 DIAGNOSIS — N1832 Chronic kidney disease, stage 3b: Secondary | ICD-10-CM | POA: Diagnosis not present

## 2023-08-04 DIAGNOSIS — E1169 Type 2 diabetes mellitus with other specified complication: Secondary | ICD-10-CM | POA: Diagnosis not present

## 2023-08-04 DIAGNOSIS — Z79899 Other long term (current) drug therapy: Secondary | ICD-10-CM | POA: Diagnosis not present

## 2023-08-12 DIAGNOSIS — Z87442 Personal history of urinary calculi: Secondary | ICD-10-CM | POA: Diagnosis not present

## 2023-08-26 DIAGNOSIS — E113311 Type 2 diabetes mellitus with moderate nonproliferative diabetic retinopathy with macular edema, right eye: Secondary | ICD-10-CM | POA: Diagnosis not present

## 2023-09-01 DIAGNOSIS — E1169 Type 2 diabetes mellitus with other specified complication: Secondary | ICD-10-CM | POA: Diagnosis not present

## 2023-09-01 DIAGNOSIS — I10 Essential (primary) hypertension: Secondary | ICD-10-CM | POA: Diagnosis not present

## 2023-09-01 DIAGNOSIS — E7800 Pure hypercholesterolemia, unspecified: Secondary | ICD-10-CM | POA: Diagnosis not present

## 2023-09-01 DIAGNOSIS — E039 Hypothyroidism, unspecified: Secondary | ICD-10-CM | POA: Diagnosis not present

## 2023-09-01 DIAGNOSIS — E113311 Type 2 diabetes mellitus with moderate nonproliferative diabetic retinopathy with macular edema, right eye: Secondary | ICD-10-CM | POA: Diagnosis not present

## 2023-09-04 DIAGNOSIS — Z79899 Other long term (current) drug therapy: Secondary | ICD-10-CM | POA: Diagnosis not present

## 2023-09-04 DIAGNOSIS — N1832 Chronic kidney disease, stage 3b: Secondary | ICD-10-CM | POA: Diagnosis not present

## 2023-09-08 DIAGNOSIS — E113311 Type 2 diabetes mellitus with moderate nonproliferative diabetic retinopathy with macular edema, right eye: Secondary | ICD-10-CM | POA: Diagnosis not present

## 2023-09-12 ENCOUNTER — Ambulatory Visit: Admitting: Cardiology

## 2023-09-15 IMAGING — CT CT ANGIO CHEST
2 of 7 series · 18 of 46 positions shown · IV contrast (APPLIED)
Comparison: None.

CLINICAL DATA: Positive D-dimer level.

EXAM:
CT ANGIOGRAPHY CHEST WITH CONTRAST
TECHNIQUE: Multidetector CT imaging of the chest was performed using the
standard protocol during bolus administration of intravenous
contrast. Multiplanar CT image reconstructions and MIPs were
obtained to evaluate the vascular anatomy.

[Series 6: thins · axial · 0.93mm/px · z∈[+1069,+1312]mm · 15 of 273 slices shown]
[im 15/273  lung]
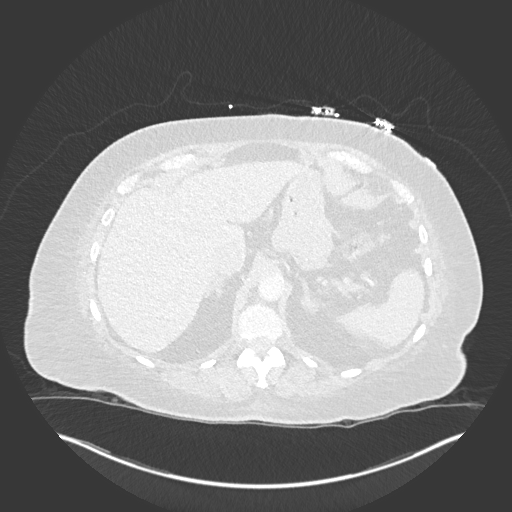
[im 29/273  soft-tissue]
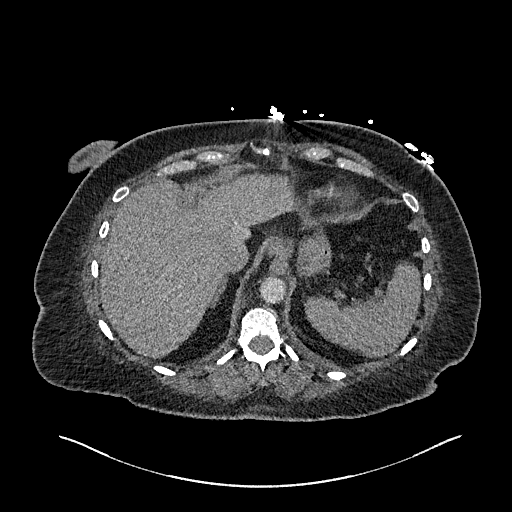
[im 58/273  lung]
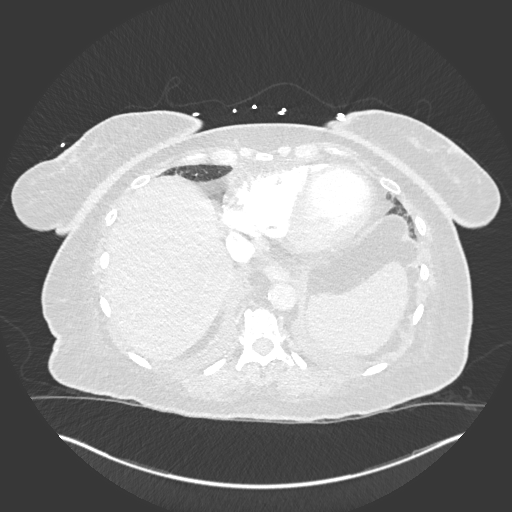
[im 72/273  soft-tissue]
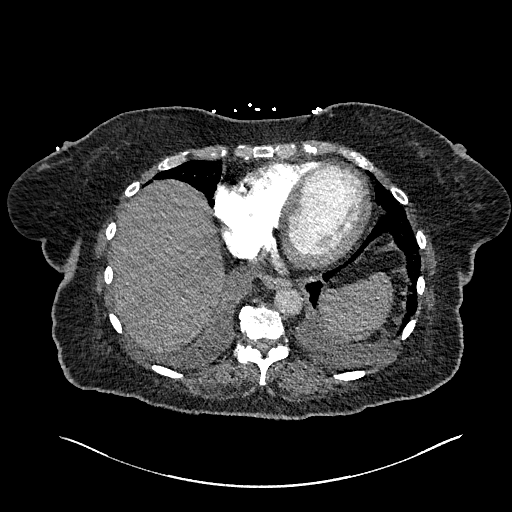
[im 86/273  lung]
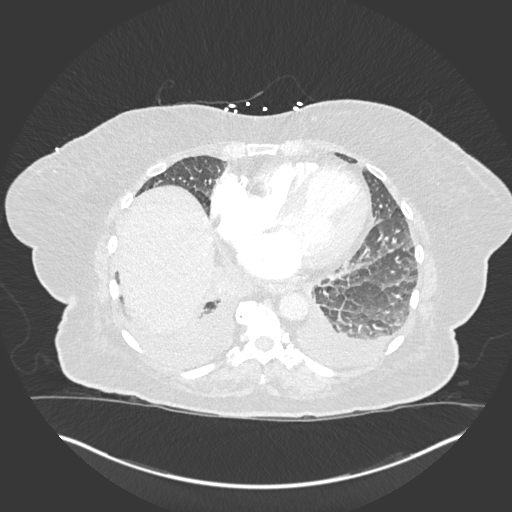
[im 101/273  soft-tissue]
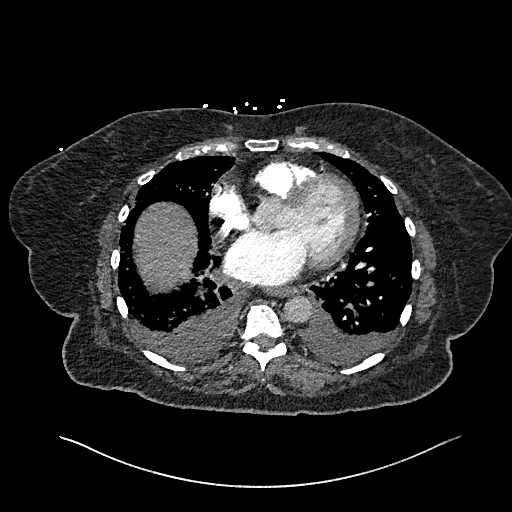
[im 115/273  lung]
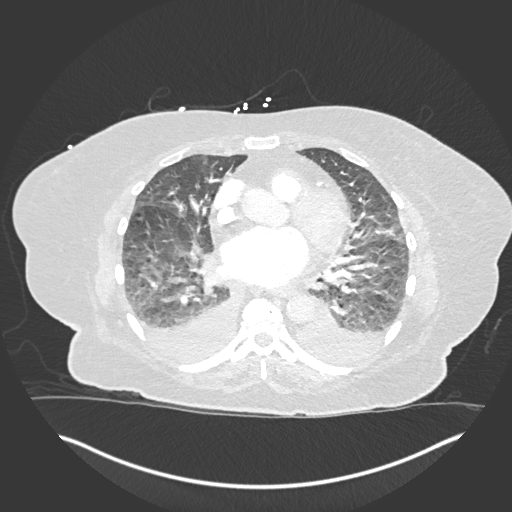
[im 144/273  soft-tissue]
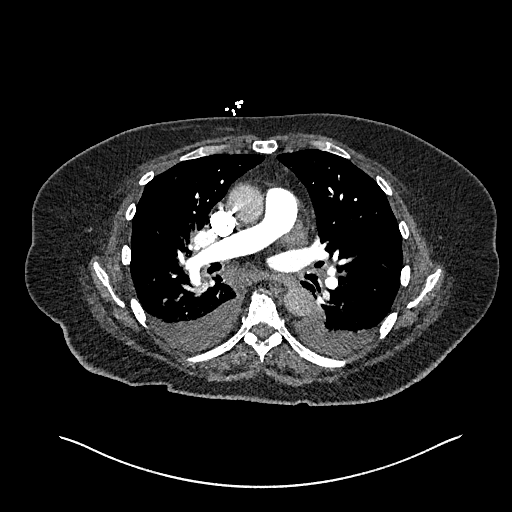
[im 158/273  lung]
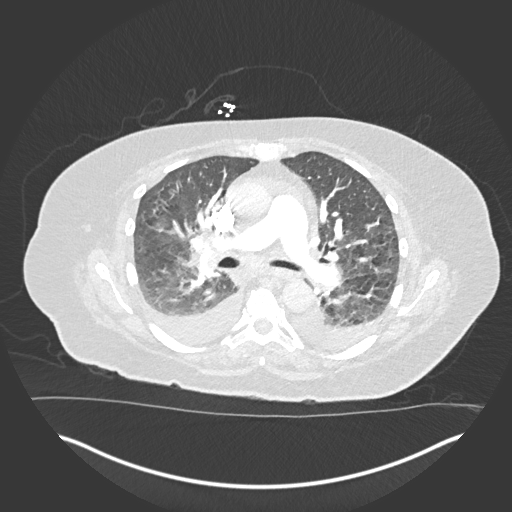
[im 172/273  soft-tissue]
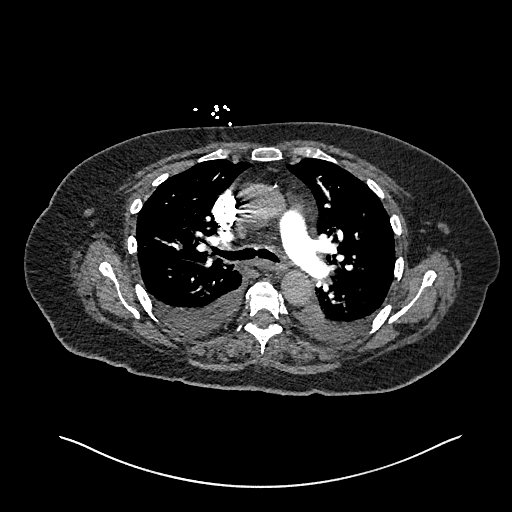
[im 187/273  lung]
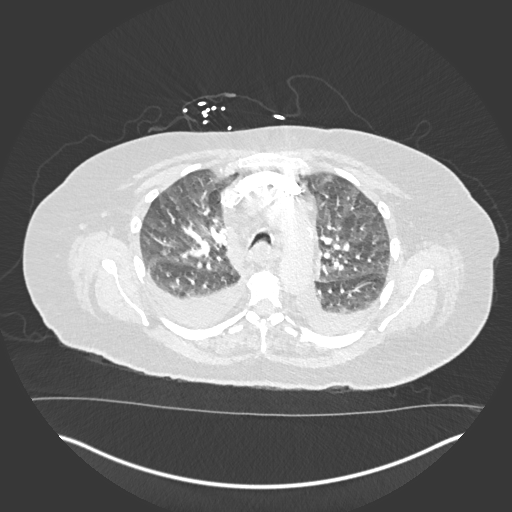
[im 201/273  soft-tissue]
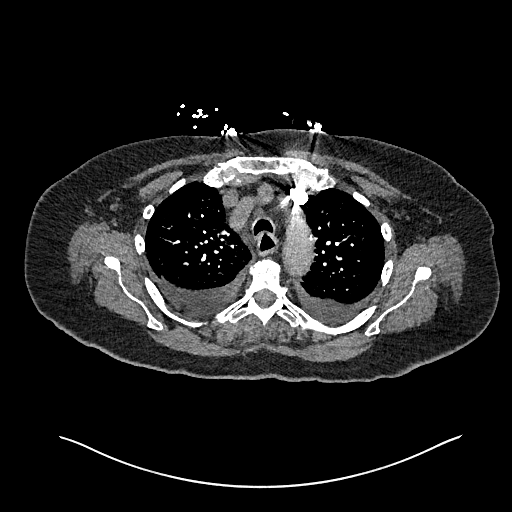
[im 230/273  lung]
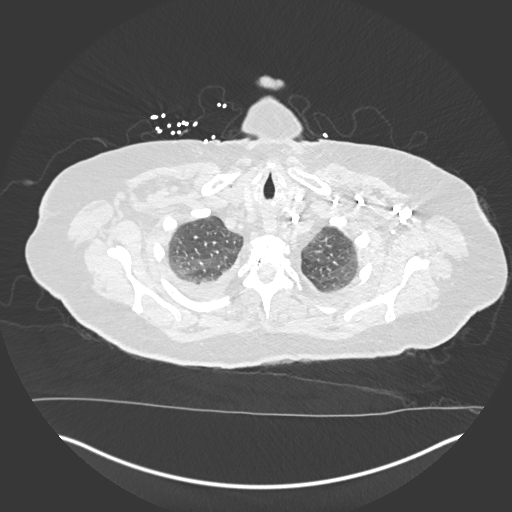
[im 244/273  soft-tissue]
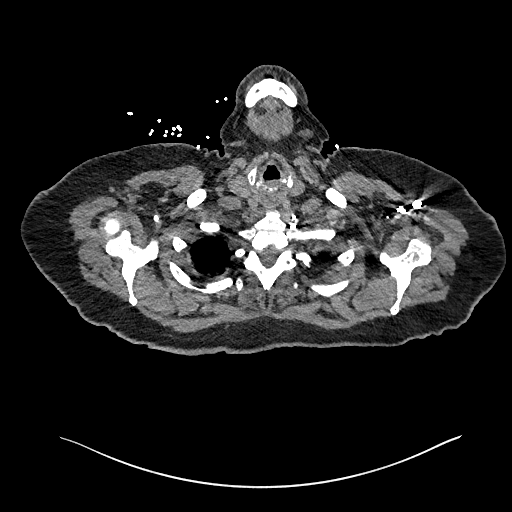
[im 258/273  lung]
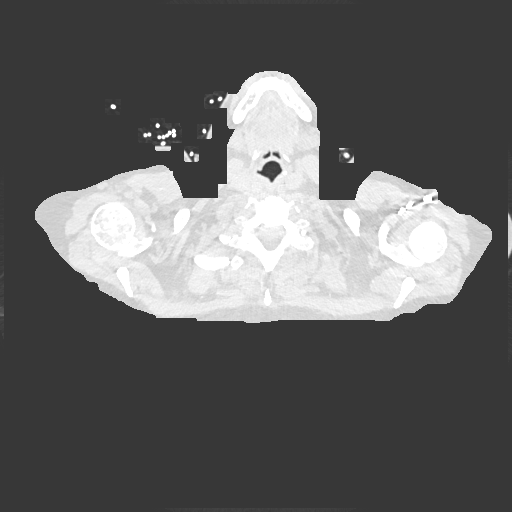

[Series 8: coronal mpr · coronal · 0.59mm/px · 3 of 138 slices shown]
[im 35/138  soft-tissue]
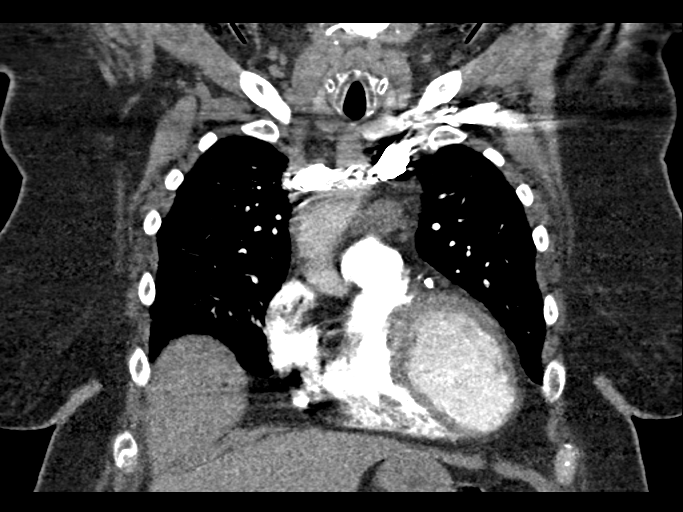
[im 69/138  soft-tissue]
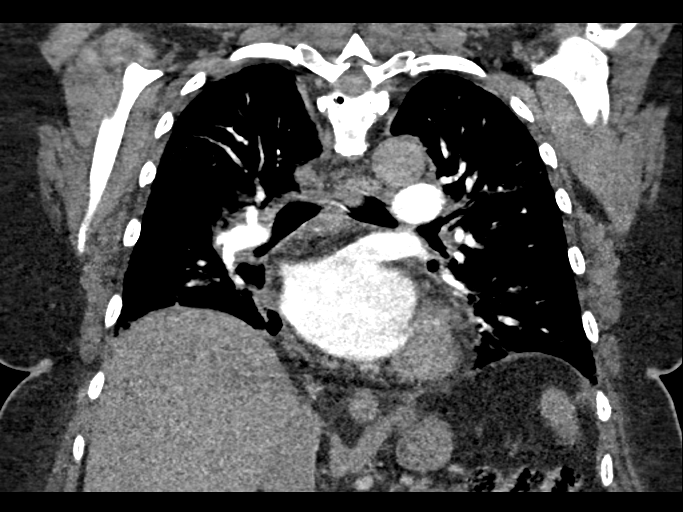
[im 103/138  soft-tissue]
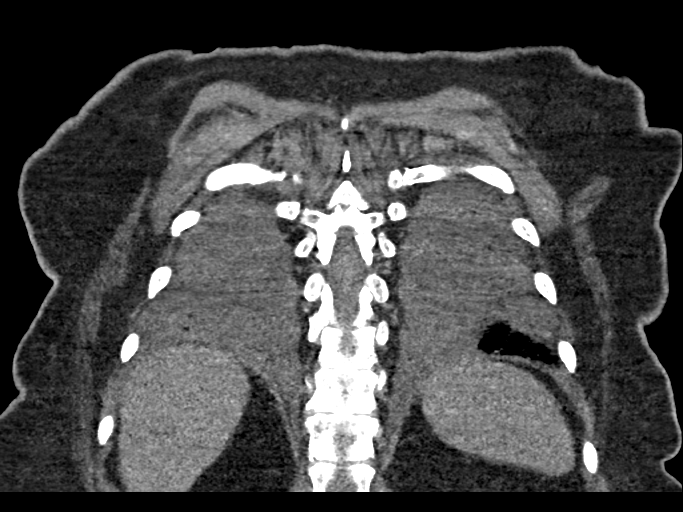

[18 of 46 positions shown; findings below may reference images not displayed]

RADIATION DOSE REDUCTION: This exam was performed according to the
departmental dose-optimization program which includes automated
exposure control, adjustment of the mA and/or kV according to
patient size and/or use of iterative reconstruction technique.

CONTRAST:  75mL OMNIPAQUE IOHEXOL 350 MG/ML SOLN
FINDINGS: Cardiovascular: Satisfactory opacification of the pulmonary arteries
to the segmental level. No evidence of pulmonary embolism. Normal
heart size. No pericardial effusion. Coronary artery calcifications
are noted.

Mediastinum/Nodes: Thyroid gland and esophagus are unremarkable.
Mildly enlarged mediastinal lymph nodes are noted, largest being
subcarinal lymph node measuring 13 mm, which most likely are
reactive or inflammatory in etiology.

Lungs/Pleura: No pneumothorax is noted. Mild to moderate bilateral
pleural effusions are noted. Probable bilateral pulmonary edema is
noted.

Upper Abdomen: No acute abnormality.

Musculoskeletal: No chest wall abnormality. No acute or significant
osseous findings.

Review of the MIP images confirms the above findings.
IMPRESSION: No definite evidence of pulmonary embolus.

Bilateral pulmonary edema is noted with mild to moderate bilateral
pleural effusions.

Coronary artery calcifications are noted suggesting coronary artery
disease.

Mildly enlarged mediastinal adenopathy is noted which most likely is
reactive or inflammatory in etiology.

Aortic Atherosclerosis ([id number]-XPR.R).

## 2023-09-15 IMAGING — DX DG CHEST 2V
3 series · 3 of 3 positions shown · non-contrast
Comparison: None.

CLINICAL DATA: Cough, shortness of breath.

EXAM:
CHEST - 2 VIEW

[x chest ap]
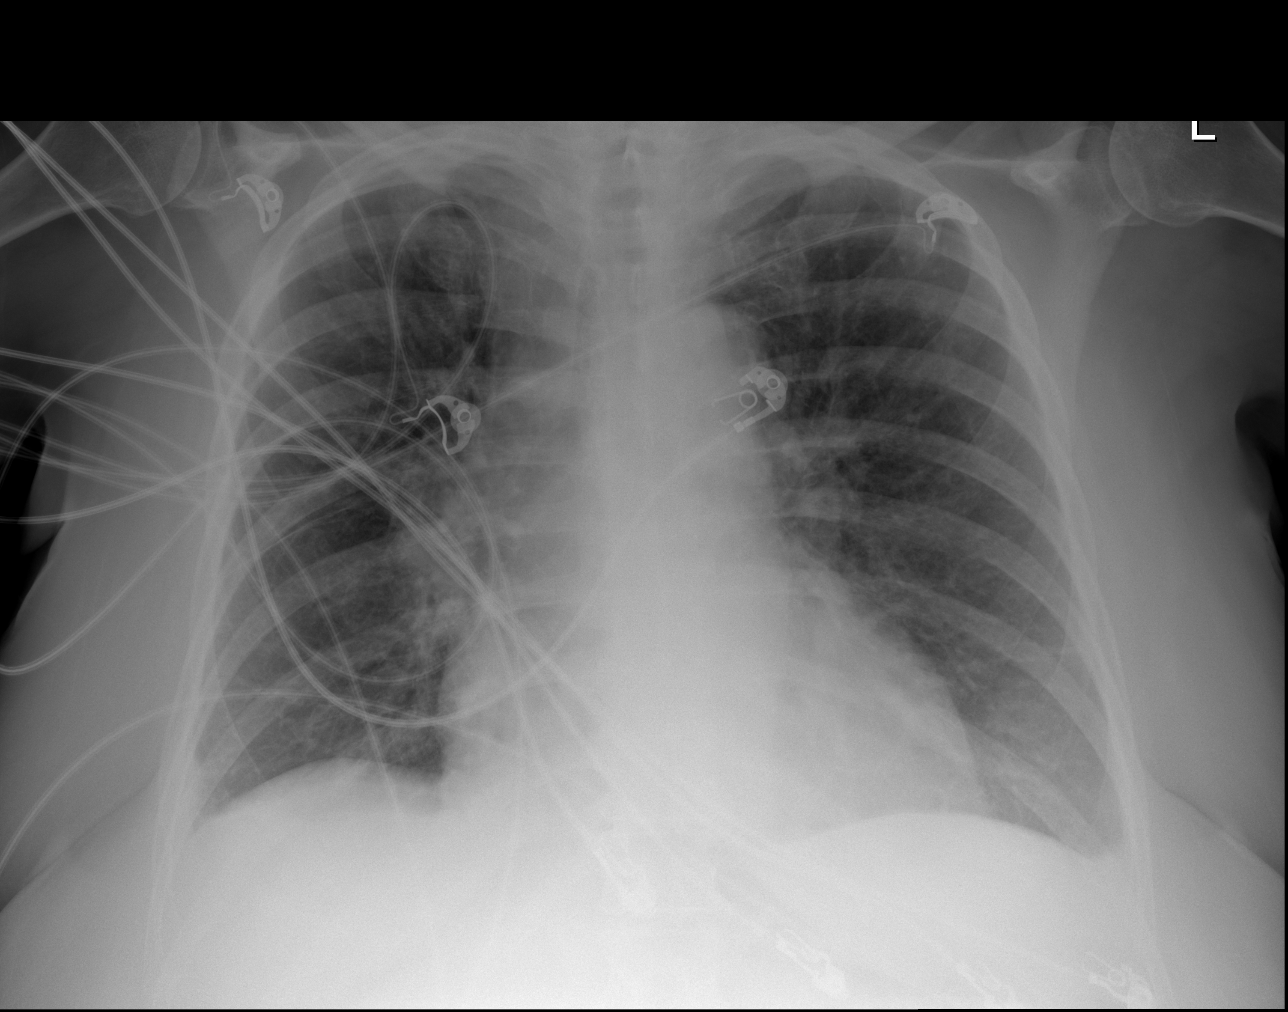

[w chest lat (1 of 2)]
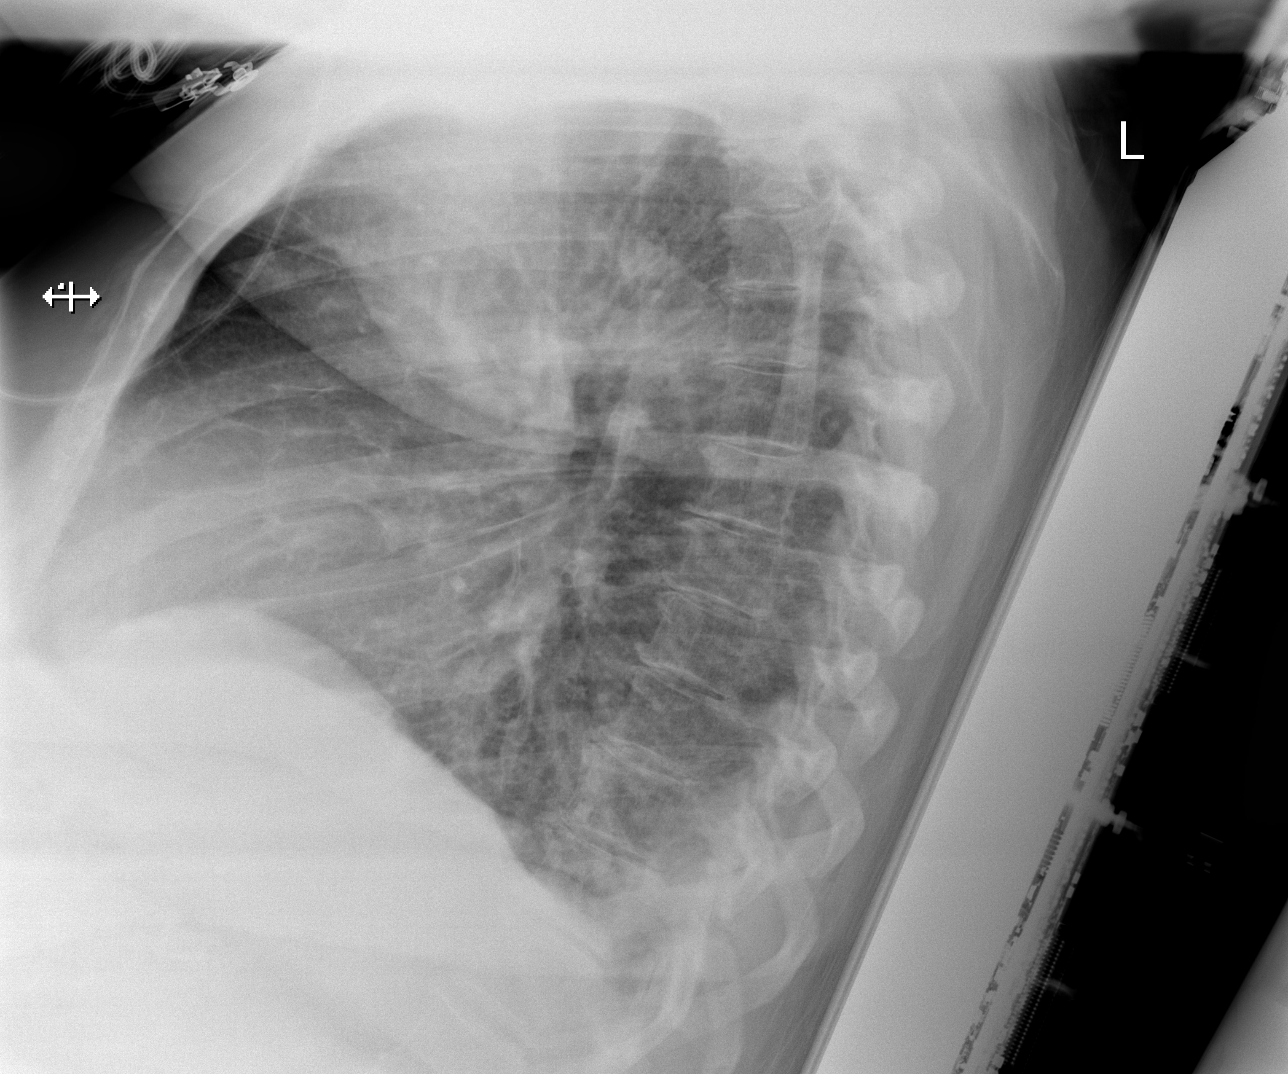

[w chest lat (2 of 2)]
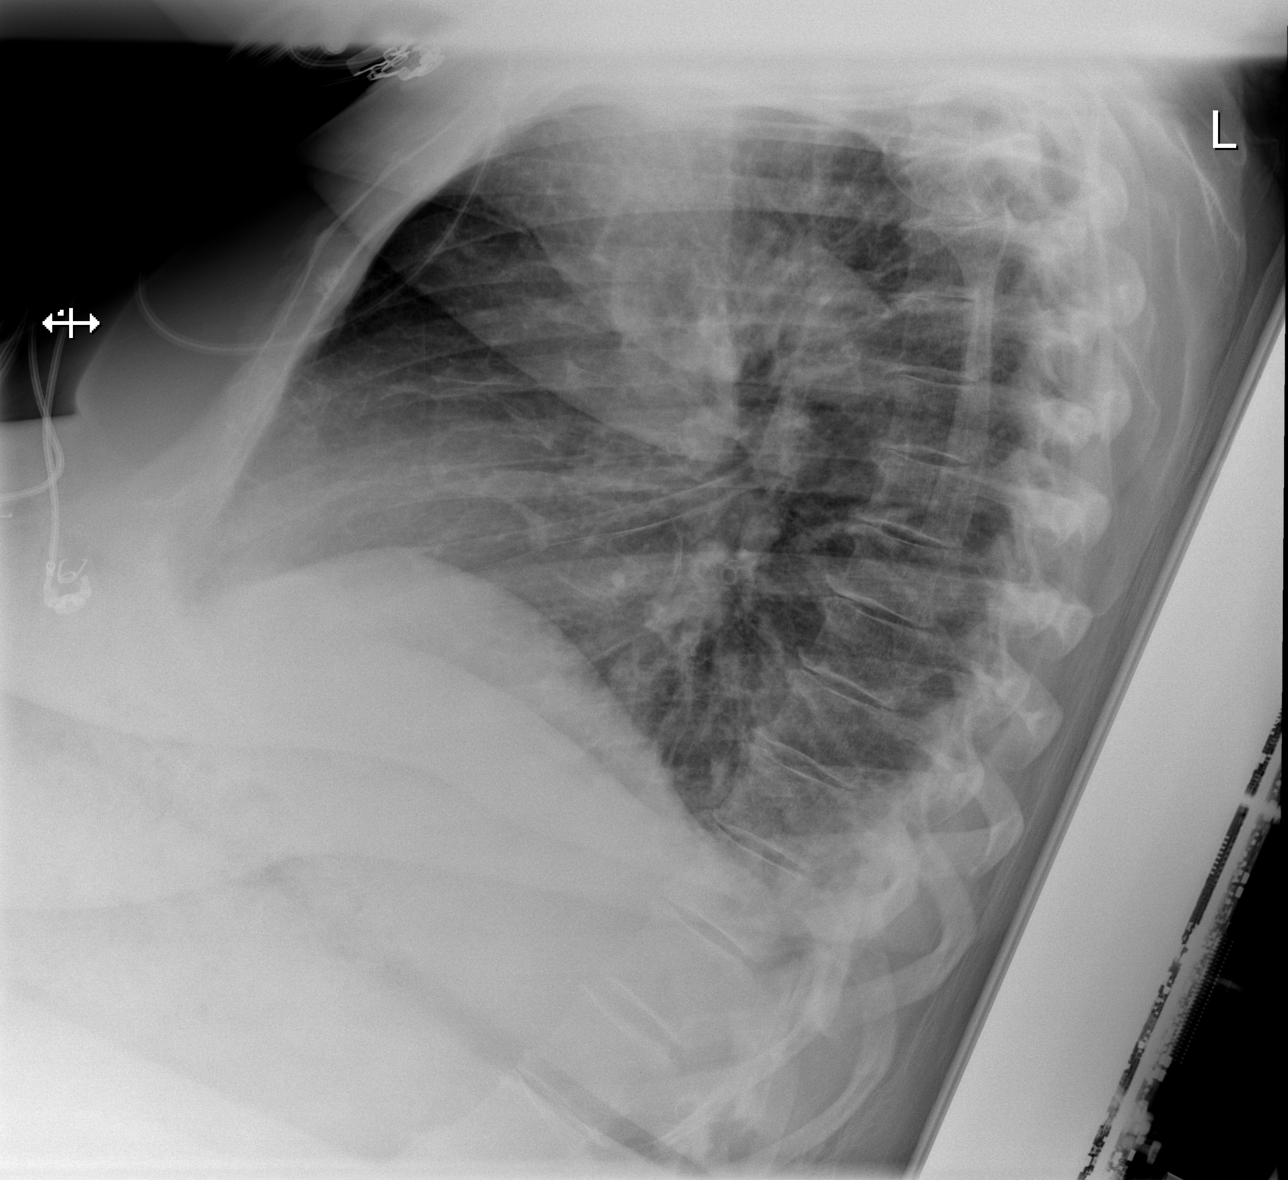

[3 of 3 positions shown; findings below may reference images not displayed]

FINDINGS: The heart size and mediastinal contours are within normal limits.
Possible minimal bibasilar subsegmental atelectasis or edema is
noted with probable small pleural effusions. The visualized skeletal
structures are unremarkable.
IMPRESSION: Possible minimal bibasilar subsegmental atelectasis or edema with
probable small pleural effusions.

## 2023-09-21 ENCOUNTER — Ambulatory Visit: Admitting: Cardiology

## 2023-10-03 ENCOUNTER — Encounter: Payer: Self-pay | Admitting: Cardiology

## 2023-10-03 ENCOUNTER — Ambulatory Visit: Attending: Cardiology | Admitting: Cardiology

## 2023-10-03 VITALS — BP 128/60 | HR 79 | Resp 16 | Ht 63.0 in | Wt 207.2 lb

## 2023-10-03 DIAGNOSIS — I252 Old myocardial infarction: Secondary | ICD-10-CM | POA: Diagnosis not present

## 2023-10-03 DIAGNOSIS — E782 Mixed hyperlipidemia: Secondary | ICD-10-CM | POA: Diagnosis not present

## 2023-10-03 DIAGNOSIS — E781 Pure hyperglyceridemia: Secondary | ICD-10-CM

## 2023-10-03 DIAGNOSIS — Z794 Long term (current) use of insulin: Secondary | ICD-10-CM

## 2023-10-03 DIAGNOSIS — Z7901 Long term (current) use of anticoagulants: Secondary | ICD-10-CM

## 2023-10-03 DIAGNOSIS — I480 Paroxysmal atrial fibrillation: Secondary | ICD-10-CM | POA: Diagnosis not present

## 2023-10-03 DIAGNOSIS — I2510 Atherosclerotic heart disease of native coronary artery without angina pectoris: Secondary | ICD-10-CM

## 2023-10-03 DIAGNOSIS — Z955 Presence of coronary angioplasty implant and graft: Secondary | ICD-10-CM | POA: Diagnosis not present

## 2023-10-03 DIAGNOSIS — I5032 Chronic diastolic (congestive) heart failure: Secondary | ICD-10-CM

## 2023-10-03 DIAGNOSIS — E1165 Type 2 diabetes mellitus with hyperglycemia: Secondary | ICD-10-CM

## 2023-10-03 DIAGNOSIS — Z6836 Body mass index (BMI) 36.0-36.9, adult: Secondary | ICD-10-CM

## 2023-10-03 DIAGNOSIS — E66812 Obesity, class 2: Secondary | ICD-10-CM

## 2023-10-03 NOTE — Patient Instructions (Addendum)
 Medication Instructions:   PLEASE CONTACT CLINIC  [phone] OR UPLOAD TO MYCHART   BACK WITH  THESE SPECIFIC MEDICINES  WITH  TIME OF DAY  AND DOSAGE TOOKEN  Vespa Torsemide  Zetia  Aldactone     *If you need a refill on your cardiac medications before your next appointment, please call your pharmacy*   Lab Work:   MAKE SURE YOU FOLLOW UP WITH YOUR PRIMARY PROVIDER  FOR CHECKING   CHOLESTEROL LEVELS.    If you have labs (blood work) drawn today and your tests are completely normal, you will receive your results only by: MyChart Message (if you have MyChart) OR A paper copy in the mail If you have any lab test that is abnormal or we need to change your treatment, we will call you to review the results.  Testing/Procedures: NONE ORDERED  TODAY    Follow-Up: At [facility], you and your health needs are our priority.  As part of our continuing mission to provide you with exceptional heart care, our providers are all part of one team.  This team includes your primary Cardiologist (physician) and Advanced Practice Providers or APPs (Physician Assistants and Nurse Practitioners) who all work together to provide you with the care you need, when you need it.  Your next appointment:   6 month(s)   Provider:   Madonna Large, DO    We recommend signing up for the patient portal called MyChart.  Sign up information is provided on this After Visit Summary.  MyChart is used to connect with patients for Virtual Visits (Telemedicine).  Patients are able to view lab/test results, encounter notes, upcoming appointments, etc.  Non-urgent messages can be sent to your provider as well.   To learn more about what you can do with MyChart, go to [email or web address].   Other Instructions

## 2023-10-03 NOTE — Progress Notes (Signed)
 Cardiology Office Note:  .   Date:  10/03/2023  ID:  Mallory Butler Officer, DOB 12-Oct-1950, MRN [id number] PCP:  Gayl Males, MD  Former Cardiology Providers: N/A [facility] Providers Cardiologist:  Madonna Large, DO , [facility] (established care January 2023.) Electrophysiologist:  None  Click to update primary MD,subspecialty MD or APP then REFRESH:1}    Chief Complaint  Patient presents with   Coronary artery disease involving native coronary artery of   Follow-up    History of Present Illness: .   Mallory Butler is a 73 y.o. Caucasian female whose past medical history and cardiovascular risk factors includes: CAD status post angioplasty/PCI (distal LM/proximal LAD, and mid LAD), ischemic cardiomyopathy, chronic systolic and diastolic heart failure, insulin -dependent diabetes mellitus type 2, hypertension, hyperlipidemia, postmenopausal female, obesity due to excess calories.   Established care in January 2023 when she was admitted to the hospital for shortness of breath and diagnosed with COVID-19 pneumonia. Echocardiogram noted reduced LVEF and regional wall motion abnormalities and given the elevated cardiac biomarkers she underwent angiography and was noted to have obstructive CAD in the distal left main/LAD distribution. She underwent angioplasty/PCI to the distal left main/proximal LAD and mid LAD.   In August 2024 she was admitted for DKA and during that hospitalization she was transferred from [facility] to [facility] for cardiovascular workup.  Echocardiogram noted mildly reduced LVEF with regional wall motion normalities.  She underwent angiography and was noted to have obstructive disease underwent coronary intervention.  Patient was last seen in the office in January 2025 and presents today for follow-up.  She denies anginal chest pain or heart failure symptoms. Continues to ambulate 8000-10,000 steps per day. Patient is unsure what medication she is currently taking,  based on her recollection patient endorses no longer taking Zetia , Vascepa , spironolactone , torsemide . Reemphasized importance of GDMT given her recovered cardiomyopathy.   Review of Systems: .   Review of Systems  Cardiovascular:  Positive for dyspnea on exertion (chronic, improved). Negative for chest pain, claudication, irregular heartbeat, leg swelling, near-syncope, orthopnea, palpitations, paroxysmal nocturnal dyspnea and syncope.  Respiratory:  Negative for shortness of breath.   Hematologic/Lymphatic: Negative for bleeding problem.  Musculoskeletal:  Negative for muscle cramps and myalgias.  Neurological:  Negative for dizziness and light-headedness.    Studies Reviewed:   EKG: EKG Interpretation Date/Time:  Monday October 03 2023 15:19:55 EDT Ventricular Rate:  78 PR Interval:  176 QRS Duration:  96 QT Interval:  384 QTC Calculation: 437 R Axis:   -64  Text Interpretation: Normal sinus rhythm Left axis deviation Incomplete right bundle branch block Minimal voltage criteria for LVH, may be normal variant ( Cornell product ) When compared with ECG of 31-Jan-2023 16:20, No significant change since last tracing Confirmed by Large Madonna [phone]) on 10/03/2023 3:44:17 PM  Echocardiogram: 02/01/2021: LVEF 30-35%, grade 1 diastolic dysfunction, regional wall motion abnormalities, estimated RAP 15 mmHg, see report for additional details   10/03/2021: 60-65%, RWMA, diastolic parameters are indeterminate, see report for more details.   Recent echo at [facility] 2024 - no report sent w/ the records. But documentation noted. LVEF of 50-55% with distal inferior/posterior, inferoseptal, anteroseptal region hypokinetic at the apical levels per documentation  Coronary angiogram 08/06/2022: LV hemodynamics: LV 162/6, EDP 16 mmHg.  Ao 165/78, mean 180 mmHg.  No pressure gradient across the aortic valve. LM: Large-caliber vessel, mildly calcified. LCx: Large-caliber vessel, has acute  angle takeoff, moderate disease is noted in the proximal segment, 20  to 30% calcific stenosis.  AV groove has a ostial 70 to 80% followed by 70 to 80% stenosis in the proximal segment.  Large OM 2 with a high-grade 80 to 90% hazy ulcerated stenosis. LAD: Large-caliber vessel, has long stents noted in the proximal and mid LAD placed on 01/06/2021 (2 overlapping 2.75 x 12 and 2.5 x 26 millimeter Onyx frontier DES postdilated in the ostium at 3.5 mm [place] balloon) are widely patent, mid to distal LAD is diffusely diseased.  Moderate amount of calcification is evident.  There is a large D1 and distal to the stent D2 with mild disease. RCA: Dominant,.  Large-caliber vessel.  Gives origin to large PDA and large PL branch.  PL branch has a hazy 80% stenosis in the midsegment.   Intervention data: Difficult procedure, successful PTCA and stenting of the mid circumflex after the origin of AV groove branch, a 3.0 x 15 mm frontier Onyx deployed at 12 atm pressure for 60 seconds.  Stenosis reduced from 80% to 0%.  No edge dissection.      Successful stenting of the PL branch of RCA with a 3.0 x 16 mm Synergy XD DES deployed at 13 atm pressure for 60 seconds, stenosis reduced from 80% stenosis to 0% stenosis with TIMI II to TIMI-3 flow.   Recommendation: Patient will be discharged home once stable from medical standpoint, triple therapy with aspirin  for 4 weeks and Plavix  75 mg mg p.o. daily for 6 months to a year along with Eliquis  in view of ACS.  93 mL contrast utilized.  RADIOLOGY: NA  Risk Assessment/Calculations:   Click Here to Calculate/Change CHADS2VASc Score The patient's CHADS2-VASc score is 6, indicating a 9.7% annual risk of stroke.     CHF History: Yes HTN History: Yes Diabetes History: Yes Stroke History: No Vascular Disease History: Yes  Labs:       Latest Ref Rng & Units 08/06/2022    1:41 AM 08/05/2022    2:59 AM 10/08/2021    4:11 AM  CBC  WBC 4.0 - 10.5 K/uL 7.3  7.4  14.3   Hemoglobin  12.0 - 15.0 g/dL 9.4  89.5  89.5   Hematocrit 36.0 - 46.0 % 29.6  32.6  32.8   Platelets 150 - 400 K/uL 250  265  425        Latest Ref Rng & Units 08/07/2022    1:06 AM 08/06/2022    1:41 AM 08/05/2022    2:59 AM  BMP  Glucose 70 - 99 mg/dL 796  803  841   BUN 8 - 23 mg/dL 16  12  17    Creatinine 0.44 - 1.00 mg/dL 8.95  9.10  9.05   Sodium 135 - 145 mmol/L 137  139  139   Potassium 3.5 - 5.1 mmol/L 4.1  3.5  3.6   Chloride 98 - 111 mmol/L 102  106  103   CO2 22 - 32 mmol/L 25  24  23    Calcium  8.9 - 10.3 mg/dL 9.0  8.2  8.5       Latest Ref Rng & Units 11/23/2022   11:30 AM 08/07/2022    1:06 AM 08/06/2022    1:41 AM  CMP  Glucose 70 - 99 mg/dL  796  803   BUN 8 - 23 mg/dL  16  12   Creatinine 9.55 - 1.00 mg/dL  8.95  9.10   Sodium 864 - 145 mmol/L  137  139   Potassium 3.5 -  5.1 mmol/L  4.1  3.5   Chloride 98 - 111 mmol/L  102  106   CO2 22 - 32 mmol/L  25  24   Calcium  8.9 - 10.3 mg/dL  9.0  8.2   Total Protein 6.0 - 8.5 g/dL 7.3     Total Bilirubin 0.0 - 1.2 mg/dL 0.3     Alkaline Phos 44 - 121 IU/L 111     AST 0 - 40 IU/L 17     ALT 0 - 32 IU/L 22       Lab Results  Component Value Date   CHOL 130 11/23/2022   HDL 40 11/23/2022   LDLCALC 62 11/23/2022   LDLDIRECT 61 11/23/2022   TRIG 165 (H) 11/23/2022   CHOLHDL 3.3 11/23/2022   No results for input(s): LIPOA in the last 8760 hours.  No components found for: NTPROBNP No results for input(s): PROBNP in the last 8760 hours. No results for input(s): TSH in the last 8760 hours.    Physical Exam:    Today's Vitals   10/03/23 1523  BP: 128/60  Pulse: 79  Resp: 16  SpO2: 95%  Weight: 207 lb 3.2 oz (94 kg)  Height: 5' 3 (1.6 m)   Body mass index is 36.7 kg/m. Wt Readings from Last 3 Encounters:  10/03/23 207 lb 3.2 oz (94 kg)  01/31/23 201 lb 3.2 oz (91.3 kg)  09/03/22 220 lb (99.8 kg)    Physical Exam  Constitutional: She appears chronically ill.  Walks with a cane, hemodynamically stable.    Neck: No JVD present.  Cardiovascular: Normal rate, regular rhythm, S1 normal, S2 normal, intact distal pulses and normal pulses. Exam reveals no gallop, no S3 and no S4.  No murmur heard. Pulmonary/Chest: Effort normal and breath sounds normal. No stridor. She has no wheezes. She has no rales.  Abdominal: Soft. Bowel sounds are normal. She exhibits no distension. There is no abdominal tenderness.  Musculoskeletal:        General: No edema.     Cervical back: Neck supple.  Neurological: She is alert and oriented to person, place, and time. She has intact cranial nerves (2-12).  Skin: Skin is warm and dry.     Impression & Recommendation(s):  Impression:   ICD-10-CM   1. Coronary artery disease involving native coronary artery of native heart without angina pectoris  I25.10     2. S/P primary angioplasty with coronary stent  Z95.5     3. History of non-ST elevation myocardial infarction (NSTEMI)  I25.2     4. Chronic heart failure with preserved ejection fraction (HFpEF) (HCC)  I50.32 EKG 12-Lead    5. Paroxysmal atrial fibrillation (HCC)  I48.0     6. Long term (current) use of anticoagulants  Z79.01     7. Type 2 diabetes mellitus with hyperglycemia, with long-term current use of insulin  (HCC)  E11.65    Z79.4     8. Hypertriglyceridemia, essential  E78.1     9. Mixed hyperlipidemia  E78.2     10. Class 2 severe obesity due to excess calories with serious comorbidity and body mass index (BMI) of 36.0 to 36.9 in adult  E66.812    E66.01    Z68.36        Recommendation(s):  Coronary artery disease involving native coronary artery of native heart without angina pectoris S/P angioplasty with coronary stent History of non-ST elevation myocardial infarction (NSTEMI) Denies anginal chest pain. PCI in January 2023 to the  distal left main/proximal LAD and mid LAD. August 2024 PCI to the mid LCx and PLV branch. Completed Plavix  for 12 months. Start aspirin  81 mg p.o.  daily. EKG is nonischemic.   Endorses compliance with Lipitor 40 mg p.o. daily. Unsure if she is on Zetia  or Vascepa .  Recommended she calls us  back for medication reconciliation. Currently on Ozempic. If LDL levels are not well-controlled will consider PCSK9 inhibitors as an adjunctive therapy given the degree of CAD  Chronic heart failure with preserved ejection fraction (HFpEF) (HCC) Stage C, NYHA class II January 2023: LVEF 30-35% August 2024: LVEF 50-55% Continue metoprolol  succinate 25 mg p.o. daily. Currently on Entresto  97/103 mg p.o. twice daily Patient is unsure if she is on spironolactone  and Demadex   Paroxysmal atrial fibrillation (HCC) Rate control: Metoprolol . Rhythm control: N/A. Thromboembolic prophylaxis: Eliquis  Does not endorse evidence of bleeding. Risks, benefits, and alternatives to anticoagulation discussed  Type 2 diabetes mellitus with hyperglycemia, with long-term current use of insulin  (HCC) Currently on insulin  as well as oral antiglycemic agents. Most recent hemoglobin A1c 8.5 as of February 2025 Reemphasized importance of glycemic control..  Mixed hyperlipidemia Currently on atorvastatin  40 mg p.o. daily Recommend a goal LDL <55 mg/dL She denies myalgia or other side effects. Patient will call us  back with regards to medication reconciliation. Recommended rechecking lipids.  Patient states that she will have it done with PCP and will send us  a copy for reference.  Hypertriglyceridemia, essential Patient was on Vascepa , we will wait medication reconciliation. Recommend a goal triglyceride level <150 mg/dL   Orders Placed:  Orders Placed This Encounter  Procedures   EKG 12-Lead    Final Medication List:   No orders of the defined types were placed in this encounter.   Medications Discontinued During This Encounter  Medication Reason   ezetimibe  (ZETIA ) 10 MG tablet Patient Preference   gabapentin  (NEURONTIN ) 300 MG capsule Patient  Preference   hydrOXYzine (ATARAX) 10 MG tablet Patient Preference   levothyroxine  (SYNTHROID ) 88 MCG tablet Patient Preference   metFORMIN (GLUCOPHAGE-XR) 500 MG 24 hr tablet Patient Preference   metFORMIN (GLUCOPHAGE) 1000 MG tablet Patient Preference   oxyCODONE  (OXY IR/ROXICODONE ) 5 MG immediate release tablet Patient Preference   prednisoLONE acetate (PRED FORTE) 1 % ophthalmic suspension Patient Preference   spironolactone  (ALDACTONE ) 25 MG tablet Patient Preference   tamsulosin (FLOMAX) 0.4 MG CAPS capsule Patient Preference   torsemide  (DEMADEX ) 10 MG tablet Patient Preference   VASCEPA  1 g capsule Patient Preference     Current Outpatient Medications:    apixaban  (ELIQUIS ) 5 MG TABS tablet, Take 1 tablet (5 mg total) by mouth 2 (two) times daily., Disp: 60 tablet, Rfl: 1   atorvastatin  (LIPITOR) 40 MG tablet, Take by mouth., Disp: , Rfl:    buPROPion (WELLBUTRIN XL) 150 MG 24 hr tablet, Take 150 mg by mouth daily., Disp: , Rfl:    glipiZIDE (GLUCOTROL) 10 MG tablet, Take 10 mg by mouth 2 (two) times daily before a meal., Disp: , Rfl:    glucose blood (ONETOUCH ULTRA) test strip, USE TO TEST 3 TIMES DAILY, Disp: , Rfl:    insulin  aspart (NOVOLOG ) 100 UNIT/ML injection, Inject 0-15 Units into the skin 3 (three) times daily with meals., Disp: 10 mL, Rfl: 11   insulin  aspart (NOVOLOG ) 100 UNIT/ML injection, Inject 0-5 Units into the skin at bedtime., Disp: 10 mL, Rfl: 11   insulin  glargine-yfgn (SEMGLEE ) 100 UNIT/ML injection, Inject 0.35 mLs (35 Units total) into the skin 2 (  two) times daily. (Patient taking differently: Inject 30-35 Units into the skin See admin instructions. Inject 35 units into the skin in the morning and 30 units at bedtime), Disp: 10 mL, Rfl: 11   metoprolol  succinate (TOPROL -XL) 25 MG 24 hr tablet, TAKE 1 TABLET (25 MG TOTAL) BY MOUTH DAILY., Disp: 90 tablet, Rfl: 1   Multiple Vitamin (MULTIVITAMIN) capsule, Take 1 capsule by mouth daily., Disp: , Rfl:     oxybutynin  (DITROPAN  XL) 15 MG 24 hr tablet, Take 15 mg by mouth at bedtime., Disp: , Rfl:    OZEMPIC, 2 MG/DOSE, 8 MG/3ML SOPN, Inject 2 mg into the skin once a week., Disp: , Rfl:    pioglitazone (ACTOS) 45 MG tablet, Take by mouth., Disp: , Rfl:    sacubitril -valsartan  (ENTRESTO ) 97-103 MG, TAKE 1 TABLET BY MOUTH TWICE A DAY, Disp: 180 tablet, Rfl: 2   venlafaxine  (EFFEXOR ) 75 MG tablet, Take 225 mg by mouth daily., Disp: , Rfl:    venlafaxine  XR (EFFEXOR -XR) 150 MG 24 hr capsule, Take 150 mg by mouth daily with breakfast. (Patient not taking: Reported on 10/03/2023), Disp: , Rfl:   Consent:   NA  Disposition:   6 months sooner if needed  Her questions and concerns were addressed to her satisfaction. She voices understanding of the recommendations provided during this encounter.    Signed, Madonna Michele HAS, [facility]  A Division of [facility] [address] [place], [place] [address]   10/03/2023 6:07 PM

## 2023-10-04 DIAGNOSIS — Z79899 Other long term (current) drug therapy: Secondary | ICD-10-CM | POA: Diagnosis not present

## 2023-10-13 DIAGNOSIS — E113311 Type 2 diabetes mellitus with moderate nonproliferative diabetic retinopathy with macular edema, right eye: Secondary | ICD-10-CM | POA: Diagnosis not present

## 2023-11-04 LAB — LAB REPORT - SCANNED
A1c: 10.2
EGFR: 55
Free T4: 1.15 ng/dL
TSH: 3.09

## 2023-11-07 ENCOUNTER — Telehealth (HOSPITAL_BASED_OUTPATIENT_CLINIC_OR_DEPARTMENT_OTHER): Payer: Self-pay | Admitting: *Deleted

## 2023-11-07 DIAGNOSIS — Z01818 Encounter for other preprocedural examination: Secondary | ICD-10-CM

## 2023-11-07 NOTE — Telephone Encounter (Signed)
   Pre-operative Risk Assessment    Patient Name: Mallory Butler  DOB: [birth year] MRN: [id number]   Date of last office visit: 10/03/2023 Date of next office visit: None  Request for Surgical Clearance    Procedure:  Colonoscopy  Date of Surgery:  Clearance TBD                                 Surgeon:  Dr. Evalene Fanti Surgeon's Group or Practice Name:  [facility] Phone number:  [phone] Fax number:  [phone]   Type of Clearance Requested:   - Medical  - Pharmacy:  Hold Aspirin  and Apixaban  (Eliquis ) 2 days prior. Stated Aspirin  to hold for 5 days prior, do not seen on pt's medication list.    Type of Anesthesia:  Not Indicated   Additional requests/questions:    Signed, Edsel Grayce Sanders   11/07/2023, 4:41 PM

## 2023-11-16 NOTE — Telephone Encounter (Signed)
 Patient with diagnosis of atrial fibrillation on Ekuqyus for anticoagulation.    Procedure:  Colonoscopy   Date of Surgery:  Clearance TBD     CHA2DS2-VASc Score = 6   This indicates a 9.7% annual risk of stroke. The patient's score is based upon: CHF History: 1 HTN History: 1 Diabetes History: 1 Stroke History: 0 Vascular Disease History: 1 Age Score: 1 Gender Score: 1    CrCl 69 Platelet count - last in 2024, please repeat for final clearance  Patient has not had an Afib/aflutter ablation in the last 3 months, DCCV within the last 4 weeks or a watchman implanted in the last 45 days     **This guidance is not considered finalized until pre-operative APP has relayed final recommendations.**

## 2023-11-16 NOTE — Telephone Encounter (Signed)
 Callback: patient needs to come in for updated CBC, also verify if patient is on ASA - per Dr. Tyree note 10/03/23 he recommended starting ASA 81mg  daily.

## 2023-11-17 NOTE — Telephone Encounter (Signed)
 Left message for pt to call back as she is going to need CBC for preop clearance. Orders have been placed and she can go to any [facility] of her choice. Lab is not fasting.   Please call our office to let us  know when she can get the lab work done so that our preop team may watch for the results to come in.

## 2023-11-17 NOTE — Addendum Note (Signed)
 Addended by: WYNETTA NIELS HERO on: 11/17/2023 10:17 AM   Modules accepted: Orders

## 2023-11-18 NOTE — Telephone Encounter (Signed)
 Will update the preop pharm-d

## 2023-11-18 NOTE — Telephone Encounter (Signed)
 Pt said she will get labs Monday 11/21/23.

## 2023-11-22 ENCOUNTER — Ambulatory Visit: Payer: Self-pay | Admitting: Pharmacist Clinician (PhC)/ Clinical Pharmacy Specialist

## 2023-11-22 LAB — CBC
Hematocrit: 45.4 % (ref 34.0–46.6)
Hemoglobin: 15 g/dL (ref 11.1–15.9)
MCH: 32 pg (ref 26.6–33.0)
MCHC: 33 g/dL (ref 31.5–35.7)
MCV: 97 fL (ref 79–97)
Platelets: 316 x10E3/uL (ref 150–450)
RBC: 4.69 x10E6/uL (ref 3.77–5.28)
RDW: 12.7 % (ref 11.7–15.4)
WBC: 11.1 x10E3/uL — ABNORMAL HIGH (ref 3.4–10.8)

## 2023-11-22 NOTE — Telephone Encounter (Signed)
 CBC stable at 316.   Okay to hold Eliquis  for 2 days.    Will not need bridging with enoxaparin.

## 2023-11-22 NOTE — Telephone Encounter (Signed)
 Dr. Michele Can you please provide recommendations for this patient who needs colonoscopy? You saw him on 10/03/23.   In addition to risk recommendations, we are asked for ASA hold in this patient who has extensive stenting in the proximal to mid LAD. This falls outside of our algorithm,   Can he proceed without further testing and is he able to hold ASA?

## 2023-11-24 NOTE — Telephone Encounter (Signed)
 As long as she has not developed any new CP or shortness of breath and physical activity has not changed since my last office visit than no additional testing is needed.   She can hold ASA for 5 days prior and hold Eliquis  2 days piror to colonoscopy as reccommended by GI.   Recommended to restart both ASA and Eliquis  post-procedure once appropriate hemostasis is achieved, will defer when to restart antiplatelet/anticoagulation to the performing provider.  Dr. Shadana Pry

## 2023-11-24 NOTE — Telephone Encounter (Signed)
 Requesting office sent duplicate.

## 2023-11-25 NOTE — Telephone Encounter (Signed)
   Patient Name: Mallory Butler  DOB: Dec 29, 1950 MRN: [id number]  Primary Cardiologist: Madonna Large, DO  Chart reviewed as part of pre-operative protocol coverage. Given past medical history and time since last visit, based on ACC/AHA guidelines, Mallory Butler is at acceptable risk for the planned procedure without further cardiovascular testing.   Per Dr. Large 11/24/18/2025 Note As long as she has not developed any new CP or shortness of breath and physical activity has not changed since my last office visit than no additional testing is needed.    She can hold ASA for 5 days prior and hold Eliquis  2 days piror to colonoscopy as reccommended by GI.    Recommended to restart both ASA and Eliquis  post-procedure once appropriate hemostasis is achieved, will defer when to restart antiplatelet/anticoagulation to the performing provider.   Dr. Large      The patient was advised that if she develops new symptoms prior to surgery to contact our office to arrange for a follow-up visit, and she verbalized understanding.  I will route this recommendation to the requesting party via Epic fax function and remove from pre-op pool.  Please call with questions.  Lamarr Satterfield, NP 11/25/2023, 7:33 AM

## 2023-12-02 ENCOUNTER — Ambulatory Visit: Payer: Self-pay | Admitting: Cardiology

## 2023-12-23 NOTE — Telephone Encounter (Signed)
 Spoke with patient. Relayed Dr. Tyree note. All concerns addressed. Patient scheduled for follow up in Mid-January

## 2023-12-23 NOTE — Telephone Encounter (Signed)
-----   Message from [place], [place] sent at 12/02/2023 12:17 PM EST ----- Ms. Mallory Butler Outside labs provided by your primary care provider were reviewed. Recommendations based on the results will be deferred to the ordering provider. However, please note A1c levels are not well-controlled its greater than 10% please focus on glycemic control.  Please have her coming, on my quarter day, may double book to discuss triglyceride management.  Non-urgent.  Regards,   Dr. Michele  ----- Message ----- From: Laird Zane POUR Sent: 11/08/2023   2:38 PM EST To: Madonna Michele, DO

## 2023-12-26 NOTE — Progress Notes (Signed)
 Mallory Butler                                          MRN: [id number]   12/26/2023   The [facility]BCI Quality Team Specialist reviewed this patient medical record for the purposes of chart review for care gap closure. The following were reviewed: chart review for care gap closure-glycemic status assessment.    VBCI Quality Team

## 2024-01-24 ENCOUNTER — Ambulatory Visit: Admitting: Cardiology

## 2024-02-03 ENCOUNTER — Encounter: Payer: Self-pay | Admitting: *Deleted

## 2024-02-03 NOTE — Progress Notes (Signed)
 Mallory Butler                                          MRN: [id number]   02/03/2024   The VBCI Quality Team Specialist reviewed this patient medical record for the purposes of chart review for care gap closure. The following were reviewed: chart review for care gap closure-glycemic status assessment.    VBCI Quality Team

## 2024-02-27 ENCOUNTER — Ambulatory Visit: Admitting: Cardiology
